# Patient Record
Sex: Female | Born: 1955 | Race: White | Hispanic: No | Marital: Married | Smoking: Never smoker
Health system: Southern US, Community
[De-identification: ages and names within clinical notes are randomized; demographics above are authoritative.]

## PROBLEM LIST (undated history)

## (undated) DIAGNOSIS — I10 Essential (primary) hypertension: Secondary | ICD-10-CM

## (undated) DIAGNOSIS — Z8619 Personal history of other infectious and parasitic diseases: Secondary | ICD-10-CM

## (undated) DIAGNOSIS — R011 Cardiac murmur, unspecified: Secondary | ICD-10-CM

## (undated) DIAGNOSIS — M545 Low back pain: Secondary | ICD-10-CM

## (undated) DIAGNOSIS — Z87448 Personal history of other diseases of urinary system: Secondary | ICD-10-CM

## (undated) HISTORY — DX: Personal history of other diseases of urinary system: Z87.448

## (undated) HISTORY — DX: Cardiac murmur, unspecified: R01.1

## (undated) HISTORY — DX: Low back pain: M54.5

## (undated) HISTORY — DX: Personal history of other infectious and parasitic diseases: Z86.19

---

## 1998-10-12 ENCOUNTER — Other Ambulatory Visit: Admission: RE | Admit: 1998-10-12 | Discharge: 1998-10-12 | Payer: Self-pay | Admitting: Obstetrics and Gynecology

## 1999-02-15 ENCOUNTER — Encounter: Admission: RE | Admit: 1999-02-15 | Discharge: 1999-02-15 | Payer: Self-pay | Admitting: Sports Medicine

## 1999-06-05 ENCOUNTER — Encounter: Admission: RE | Admit: 1999-06-05 | Discharge: 1999-06-05 | Payer: Self-pay | Admitting: Family Medicine

## 1999-10-29 ENCOUNTER — Other Ambulatory Visit: Admission: RE | Admit: 1999-10-29 | Discharge: 1999-10-29 | Payer: Self-pay | Admitting: Obstetrics and Gynecology

## 2004-10-18 ENCOUNTER — Ambulatory Visit: Payer: Self-pay | Admitting: Internal Medicine

## 2004-10-23 ENCOUNTER — Ambulatory Visit: Payer: Self-pay | Admitting: Family Medicine

## 2004-10-25 ENCOUNTER — Ambulatory Visit: Payer: Self-pay | Admitting: Family Medicine

## 2004-10-30 ENCOUNTER — Ambulatory Visit: Payer: Self-pay | Admitting: Family Medicine

## 2004-11-01 ENCOUNTER — Ambulatory Visit: Payer: Self-pay | Admitting: Sports Medicine

## 2005-12-16 ENCOUNTER — Ambulatory Visit: Payer: Self-pay | Admitting: Family Medicine

## 2005-12-18 ENCOUNTER — Ambulatory Visit: Payer: Self-pay | Admitting: Family Medicine

## 2009-02-09 LAB — CONVERTED CEMR LAB: Pap Smear: NORMAL

## 2010-03-01 ENCOUNTER — Emergency Department (HOSPITAL_COMMUNITY): Admission: EM | Admit: 2010-03-01 | Discharge: 2010-03-01 | Payer: Self-pay | Admitting: Family Medicine

## 2010-03-05 ENCOUNTER — Ambulatory Visit: Payer: Self-pay | Admitting: Internal Medicine

## 2010-03-05 ENCOUNTER — Telehealth: Payer: Self-pay | Admitting: Internal Medicine

## 2010-03-05 DIAGNOSIS — M545 Low back pain, unspecified: Secondary | ICD-10-CM

## 2010-03-05 HISTORY — DX: Low back pain, unspecified: M54.50

## 2010-03-14 ENCOUNTER — Telehealth: Payer: Self-pay | Admitting: *Deleted

## 2010-03-20 ENCOUNTER — Telehealth: Payer: Self-pay | Admitting: *Deleted

## 2010-05-08 ENCOUNTER — Ambulatory Visit: Payer: Self-pay | Admitting: Internal Medicine

## 2010-05-20 ENCOUNTER — Encounter: Payer: Self-pay | Admitting: Internal Medicine

## 2010-06-13 ENCOUNTER — Ambulatory Visit: Payer: Self-pay | Admitting: Internal Medicine

## 2010-06-13 NOTE — Assessment & Plan Note (Signed)
Summary: SEVERE LOWER BACK PAIN/ACUTE ISSUE ONLY/PT TO EST LATER/CJR   Vital Signs:  Patient profile:   55 year old female Menstrual status:  postmenopausal Height:      62.75 inches Weight:      127 pounds BMI:     22.76 Pulse rate:   66 / minute BP sitting:   110 / 80  (right arm) Cuff size:   regular  Vitals Entered By: Romualdo Bolk, CMA (AAMA) (March 05, 2010 10:50 AM) CC: New Pt- Lower back pain that started on 10/19. No injury. No burning upon urination or lower abd. pain.      Menstrual Status postmenopausal Last PAP Result normal   History of Present Illness: Vicki Stevens comes in today  for new patient  acute problem visit . However has been seen in the ED urgent care for this. on October 12  after running  her usual run . She C/O of being stiff in am and  run no change and then crescendo  pain as above  .     had run less the week before and then again.      raking  acorns in yard ois the only change in vigorous  activity .    Pain no radaition but slight cramp ant right  thigh.   Pain 7-8 /10    better with   standing  laying back.  Now is about    5/10 at its worse.     Preventive Care Screening  Pap Smear:    Date:  02/09/2009    Results:  normal   Last Tetanus Booster:    Date:  05/12/2006    Results:  Historical    Contraindications/Deferment of Procedures/Staging:    Test/Procedure: FLU VAX    Reason for deferment: patient declined     Test/Procedure: Mammogram    Reason for deferment: patient declined     Test/Procedure: Colonoscopy    Reason for deferment: patient declined   Preventive Screening-Counseling & Management  Alcohol-Tobacco     Alcohol drinks/day: <1     Alcohol type: wine     Smoking Status: never  Caffeine-Diet-Exercise     Caffeine use/day: 2     Does Patient Exercise: yes     Type of exercise: runs     Times/week:  5-6 miles   Safety-Violence-Falls     Seat Belt Use: yes     Firearms in the Home: firearms in  the home     Smoke Detectors: yes      Drug Use:  no.    Current Medications (verified): 1)  Flexeril 10 Mg Tabs (Cyclobenzaprine Hcl) .Marland Kitchen.. 1 Q 12 Hours 2)  Diclofenac Sodium 75 Mg Tbec (Diclofenac Sodium) .Marland Kitchen.. 1 By Mouth Q 12 Hours 3)  Vicodin 5-500 Mg Tabs (Hydrocodone-Acetaminophen) .Marland Kitchen.. 1 Q 8 Hours As Needed  Allergies (verified): No Known Drug Allergies  Past History:  Past Medical History: Unremarkable Hxof varicella  hx of blood in urine   Dr Vonita Moss   felt from  distance running. many years ago.  G3P1  hx of heart murmur   had chest pain.   has MVP  echo.  as a young adult.   Past Surgical History: Denies surgical history  Past History:  Care Management: Urgent Care- [facility] Gynecology: Dr Henderson Cloud   Family History: Father: Died of Cancer age 70- Lung Ca Mother: Died age 48- MVA Siblings: Healthy  Social History: Married hhof 2   daughter  in  college  Never Smoked Alcohol use-yes- Social Drug use-no Regular exercise-yes pet dog and cat.  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    Physical Exam  General:  Well-developed,well-nourished,in no acute distress; alert,appropriate and cooperative throughout examination mildly  uncomfortable with sitting   Head:  normocephalic and atraumatic.   Neck:  No deformities, masses, or tenderness noted. Lungs:  Normal respiratory effort, chest expands symmetrically. Lungs are clear to  auscultation, no crackles or wheezes. Heart:  Normal rate and regular rhythm. S1 and S2 normal without gallop, murmur, click, rub or other extra sounds. Msk:  back  tender in  SI area but no bony tenderness  pain more on flexion and extension   toe heel walk ok but some discomfort  Pulses:  pulses intact without delay   Extremities:  no clubbing cyanosis or edema  Neurologic:  alert & oriented X3, strength normal in all extremities, and DTRs symmetrical and normal.  mildy antalgic gait   Skin:  turgor normal, color normal, and no ecchymoses.   Cervical Nodes:  No lymphadenopathy noted Psych:  Oriented X3, good eye contact, not anxious appearing, and not depressed appearing.   ed note obtained and reviewed   Impression & Recommendations:  Problem # 1:  LOW BACK PAIN, ACUTE (ICD-724.2) with out alarm features  . and no discrete injury .  disc  activity  rec and aavoid run but cross train until  better .  Expectant management and rx   narcotic med only as needed  .  Discussed risk benefit  . Her updated medication list for this problem includes:    Flexeril 10 Mg Tabs (Cyclobenzaprine hcl) .Marland Kitchen... 1 q 12 hours    Diclofenac Sodium 75 Mg Tbec (Diclofenac sodium) .Marland Kitchen... 1 by mouth q 12 hours    Vicodin 5-500 Mg Tabs (Hydrocodone-acetaminophen) .Marland Kitchen... 1 q 8 hours as needed  Complete Medication List: 1)  Flexeril 10 Mg Tabs (Cyclobenzaprine hcl) .Marland Kitchen.. 1 q 12 hours 2)  Diclofenac Sodium 75 Mg Tbec (Diclofenac sodium) .Marland Kitchen.. 1 by mouth q 12 hours 3)  Vicodin 5-500 Mg Tabs (Hydrocodone-acetaminophen) .Marland Kitchen.. 1 q 8 hours as needed  Patient Instructions: 1)  agree this is mechanical  back pain. 2)  walking  is good. if not hurting . 3)  expect improvement in 2 week course and  resolution .  in a month  4)  otherwise   would get x ray and or further evlauation. 5)  Cross train in the meantime. Avoid bending and lifting.  for now.  6)  Sitting is difficult on the back .  Prescriptions: VICODIN 5-500 MG TABS  (HYDROCODONE-ACETAMINOPHEN) 1 q 8 hours as needed  #20 x 0   Entered and Authorized by:   Madelin Headings MD   Signed by:   Madelin Headings MD on 03/05/2010   Method used:   Print then Give to Patient   RxID:   [id number]    Orders Added: 1)  New Patient Level III [99203]

## 2010-06-13 NOTE — Progress Notes (Signed)
Summary: ED Visit from 03/01/10   Lorelee, Mclaurin MRN: [id number] Acct#: [id number] PHYSICIAN DOCUMENTATION SHEET Fri Oct 21 14:38:14 EDT 2011 Eligha Bridegroom. [facility] [address] [place], [place] [address] PHONE: [phone] MRN: [id number] Account #: [id number] Name: Salvador, Bigbee Sex: F Age: 55 DOB: 1955/08/23 Complaint: Back pain Primary Diagnosis: Low back pain Arrival Time: 03/01/2010 08:53 Discharge Time: 03/01/2010 11:12 All Providers: Dr. Arnoldo Hooker - FP PROVIDER: Dr. Arnoldo Hooker - FP HPI: The patient is a 55 year old female who presents with a chief complaint of back pain. The history was provided by the patient. here for low back pain x3 days.she reports she felt back was stiff 3 days ago. She still went running her usual and later that day pain in low back increased gradually. reports has been taking advil for pain 800mg  every 4hrs not helping. No pain when laying flat and sitting. reports pain with standing. No pain in buttock. occasionally feels pain in anterior mid thigh but no pains shooting down lower leg. No leg pain now. reports spasms in back. nof alls. no nocturnal pain. no saddle anesthesia. No tingling numbness weakness arms/legs. No bladder bowel incontinence. No neuro compl. .No urinary or vaginal compl. No hematuria. no flnak pain. no abdominal pain. no fever chills. no h/o low back pain in past. 10:53 03/01/2010 by Arnoldo Hooker - FP, Dr. Linus Orn: Constitutional: all Negative; Negative for fever, chills and dizziness. Eyes: all Negative ENMT: all Negative Cardiovascular: all Negative Respiratory: all Negative; Negative for cough, dyspnea and wheezing. Gastrointestinal: all Negative; Negative for nausea, vomiting, diarrhea and abdominal pain. Genitourinary: all Negative; Negative for dysuria, hematuria, flank pain and Frequency. Gynecologic: all Negative Musculoskeletal: otherwise Negative; Positive for back pain. Negative for  neck pain. NOTE - see HPI NOTE - see HPI Skin: all Negative; Negative for rash, abrasions and bruising. Neuro: all Negative; Negative for headache, confusion, Weakness, altered mental status, dizziness, lightheadedness, memory impairment, neck stiffness, numbness, seizure and vision loss. 10:53 03/01/2010 by Arnoldo Hooker - FP, Dr. Earl Lagos: 1 Marlita, Keil MRN: [id number] Acct#: [id number] Documentation: physician reviewed/amended Historian: patient Patient's Current Physicians Patient's Current Physicians (please list PCP first) Henderson Cloud - OB/GYN, Guy Sandifer Past medical history: none Social History: non-smoker, no drug abuse, occasional drinker Contraception: menopause Special Needs: none Allergies Drug Reaction Allergy Note NKDA 10:39 03/01/2010 by Arnoldo Hooker - FP, Dr. Home Medications: Documentation: physician reviewed/amended Medications Medication [Medication] Dosage Frequency Last Dose Advil Oral as needed 10:39 03/01/2010 by Arnoldo Hooker - FP, Dr. Physical examination: Vital signs and O2 SAT: reviewed, normal Constitutional: well developed, well nourished, in no acute distress Head and Face: normocephalic, atraumatic Neck: supple, full range of motion, no meningeal signs, no lymphadenopathy Spine: entire spine non-tender, lumbar spine non-tender, lumbar paraspinal tenderness NOTE - mild tender bilat paralumbar muscles . no spinal tednerness. Rom linmited at forward flexion secondary to pain. n Rom extension and lateral flexion full intact. no arsh abrasions deformity. spoke with pt about U/A and lumbar spine xrays. she declines both saying she is runner always had blood in urine and has seen urologist in past and wants to try something for muscle spasms adn strong pain meds first and if not better then will think about xrays. she declines xrays today. Cardiovascular: regular rate and rhythm, no murmur, rub, or gallop Respiratory: normal, no rales, rhonchi,  wheezes, or rub, breath sounds clear and equal bilaterally Chest: nontender, movement normal Abdomen: soft, nontender, normal bowel sounds, no guarding Genitourinary:  no CVA tenderness Extremities: normal, no abnormalities, no deformity, full range of motion, neurovascularly intact, pulses normal, no tenderness, no edema NOTE - SLR sitting and lying down negative. Gait toe and heel normal. Sensation normal. SI joint stable. ROM hip knee ankle normal. Babinskis bilat down going. Neuro: AA&Ox3, Cranial Nerves II-XII intact, motor intact in all extremities, sensation normal , normal reflexes, gait normal, normal coordination, normal speech Skin: color normal, no rash 2 Fiona, Coto MRN: [id number] Acct#: [id number] Psychiatric: AA&Ox3 10:53 03/01/2010 by Arnoldo Hooker - FP, Dr. Patient disposition: Patient disposition: Disch - Home Primary Diagnosis: low back pain Counseling: advised of diagnosis, advised of treatment plan, advised of need for close follow- up, advised of need to return for worsening or changing symptoms, advised of specific symptoms that should prompt their return 10:39 03/01/2010 by Arnoldo Hooker - FP, Dr. Prescriptions: Prescription Medication Dispense Sig Line Flexeril 5 mg Tab 10 1tab by mouth every 12hrs as needed for muscle spasm, causes drowsiness. VICOdin 5 mg-500 mg Tab 10 One tablet PO Q8hrs prn pain, causes drowsiness Voltaren 75 mg Tab 20 One tablet PO every 12hrs for pain with food . no ibuprofen or aleeve with it. 10:40 03/01/2010 by Arnoldo Hooker - FP, Dr. Medication disposition: Medications Medication [Medication] Dosage Frequency Last Dose Medication disposition PCP contact Advil Oral as needed stop 10:40 03/01/2010 by Arnoldo Hooker - FP, Dr. Discharge: Discharge Instructions: *free text Append a Note to Discharge Instructions: Voltaren 1tab every 12hrs for pain with food and take vicodin only as needed to take edge  off pain. ice back each time 3 times day. Follow up with Primary DR or here or orthopedic Dr Cecilie Kicks if back pain not better but if back pain worse ,any tingling numbness weakness arms/legs, bladder /bowel loss of control , fever chills getting sicker call 911 and go to Emergency room rightaway for further evaluation. 3 Riham, Polyakov MRN: [id number] Acct#: [id number] Referral/Appointment Refer Patient To: Phone Number: Follow-up in Appointment Details: Victorino Dike - MD, [facility] - Orthopedics [phone] - OB/GYN, Guy Sandifer [phone] pvt [phone] Drug Instructions: pain acetaminophen hydrocodone, pain muscle relaxant flexeril, pain nsaid 10:43 03/01/2010 by Arnoldo Hooker - FP, Dr. Milinda Pointer electronically signed by ER Physician 14:38 03/01/2010 by Arnoldo Hooker - FP, Dr. Dian Situ

## 2010-06-13 NOTE — Progress Notes (Signed)
Summary: REFILL REQUEST (Vicodin)  Phone Note Refill Request Message from:  Patient   [id number] on March 20, 2010 8:44 AM  Refills Requested: Medication #1:  VICODIN 5-500 MG TABS 1 q 8 hours as needed.   Notes: [facility] - [address]    Initial call taken by: Debbra Riding,  March 20, 2010 8:45 AM  Follow-up for Phone Call        ok to refill x 1  Follow-up by: Madelin Headings MD,  March 20, 2010 9:00 AM  Additional Follow-up for Phone Call Additional follow up Details #1::        Rx called in. Additional Follow-up by: Romualdo Bolk, CMA (AAMA),  March 20, 2010 9:19 AM    Prescriptions: VICODIN 5-500 MG TABS (HYDROCODONE-ACETAMINOPHEN) 1 q 8 hours as needed  #20 x 0   Entered by:   Romualdo Bolk, CMA (AAMA)   Authorized by:   Madelin Headings MD   Signed by:   Romualdo Bolk, CMA (AAMA) on 03/20/2010   Method used:   Telephoned to ...       [facility] [address] [phone]* (retail)       [address]       [place], [place]  [address]       Ph: [phone] or [phone]       Fax: [phone]   RxID:   [id number]

## 2010-06-13 NOTE — Letter (Signed)
Summary: Patient History Form  Patient History Form   Imported By: Maryln Gottron 03/14/2010 13:16:01  _____________________________________________________________________  External Attachment:    Type:   Image     Comment:   External Document

## 2010-06-13 NOTE — Progress Notes (Signed)
Summary: REFILL REQUEST  Phone Note Refill Request Message from:  Patient on March 14, 2010 9:57 AM  Refills Requested: Medication #1:  FLEXERIL 10 MG TABS 1 q 12 hours   Notes[facility].    Initial call taken by: Debbra Riding,  March 14, 2010 9:57 AM  Follow-up for Phone Call        ok to refill x 1  Follow-up by: Madelin Headings MD,  March 14, 2010 11:31 AM  Additional Follow-up for Phone Call Additional follow up Details #1::        Rx faxed to pharmacy. Additional Follow-up by: Romualdo Bolk, CMA (AAMA),  March 14, 2010 11:58 AM    Prescriptions: FLEXERIL 10 MG TABS (CYCLOBENZAPRINE HCL) 1 q 12 hours  #40 x 0   Entered by:   Romualdo Bolk, CMA (AAMA)   Authorized by:   Madelin Headings MD   Signed by:   Romualdo Bolk, CMA (AAMA) on 03/14/2010   Method used:   [facility]ronically to        [facility] [address] [phone]* (retail)       [address]       [place], [place]  [address]       Ph: [phone] or [phone]       Fax: [phone]   RxID:   [id number]

## 2010-06-19 ENCOUNTER — Ambulatory Visit: Payer: Self-pay | Admitting: Internal Medicine

## 2012-08-04 ENCOUNTER — Encounter: Payer: Self-pay | Admitting: Internal Medicine

## 2012-08-04 ENCOUNTER — Ambulatory Visit (INDEPENDENT_AMBULATORY_CARE_PROVIDER_SITE_OTHER): Payer: 59 | Admitting: Internal Medicine

## 2012-08-04 VITALS — BP 144/84 | HR 62 | Temp 98.6°F | Wt 134.0 lb

## 2012-08-04 DIAGNOSIS — L309 Dermatitis, unspecified: Secondary | ICD-10-CM | POA: Insufficient documentation

## 2012-08-04 DIAGNOSIS — L259 Unspecified contact dermatitis, unspecified cause: Secondary | ICD-10-CM

## 2012-08-04 DIAGNOSIS — R635 Abnormal weight gain: Secondary | ICD-10-CM | POA: Insufficient documentation

## 2012-08-04 DIAGNOSIS — R030 Elevated blood-pressure reading, without diagnosis of hypertension: Secondary | ICD-10-CM

## 2012-08-04 DIAGNOSIS — I10 Essential (primary) hypertension: Secondary | ICD-10-CM | POA: Insufficient documentation

## 2012-08-04 DIAGNOSIS — Z7189 Other specified counseling: Secondary | ICD-10-CM

## 2012-08-04 NOTE — Patient Instructions (Addendum)
Get bp check  Outside of the office.   Consider-  DASH 3500 calories is the energy content of a pound of body weight .Must have a 3500 cal deficit to lose one pound . Thus decrease 500 calorie equivalent per day in food or drink intake / or exercise  for 7 days to lose one pound.   Perianal dermatitis can be a  Yeast  infection with  Irritation  .     Use monistat or lotrimin miconazole or clotrimazole  Twice a day for at least 2 weeks    Can add 1-2 % hydrocortisone tiwce a day for itching and irritation.  Keep dry.  If no help at all in 2 weeks contact us.  If helping continue for another  2 weeks  Or so or as needed. Avoid wiopes and harsh chemicals also .   DASH Diet The DASH diet stands for "Dietary Approaches to Stop Hypertension." It is a healthy eating plan that has been shown to reduce high blood pressure (hypertension) in as little as 14 days, while also possibly providing other significant health benefits. These other health benefits include reducing the risk of breast cancer after menopause and reducing the risk of type 2 diabetes, heart disease, colon cancer, and stroke. Health benefits also include weight loss and slowing kidney failure in patients with chronic kidney disease.  DIET GUIDELINES  Limit salt (sodium). Your diet should contain less than 1500 mg of sodium daily.  Limit refined or processed carbohydrates. Your diet should include mostly whole grains. Desserts and added sugars should be used sparingly.  Include small amounts of heart-healthy fats. These types of fats include nuts, oils, and tub margarine. Limit saturated and trans fats. These fats have been shown to be harmful in the body. CHOOSING FOODS  The following food groups are based on a 2000 calorie diet. See your Registered Dietitian for individual calorie needs. Grains and Grain Products (6 to 8 servings daily)  Eat More Often: Whole-wheat bread, brown rice, whole-grain or wheat pasta, quinoa, popcorn  without added fat or salt (air popped).  Eat Less Often: White bread, white pasta, white rice, cornbread. Vegetables (4 to 5 servings daily)  Eat More Often: Fresh, frozen, and canned vegetables. Vegetables may be raw, steamed, roasted, or grilled with a minimal amount of fat.  Eat Less Often/Avoid: Creamed or fried vegetables. Vegetables in a cheese sauce. Fruit (4 to 5 servings daily)  Eat More Often: All fresh, canned (in natural juice), or frozen fruits. Dried fruits without added sugar. One hundred percent fruit juice ( cup [237 mL] daily).  Eat Less Often: Dried fruits with added sugar. Canned fruit in light or heavy syrup. [employer], Fish, and Poultry (2 servings or less daily. One serving is 3 to 4 oz [85-114 g]).  Eat More Often: Ninety percent or leaner ground beef, tenderloin, sirloin. Round cuts of beef, chicken breast, [place] breast. All fish. Grill, bake, or broil your meat. Nothing should be fried.  Eat Less Often/Avoid: Fatty cuts of meat, [place], or chicken leg, thigh, or wing. Fried cuts of meat or fish. Dairy (2 to 3 servings)  Eat More Often: Low-fat or fat-free milk, low-fat plain or light yogurt, reduced-fat or part-skim cheese.  Eat Less Often/Avoid: Milk (whole, 2%).Whole milk yogurt. Full-fat cheeses. Nuts, Seeds, and Legumes (4 to 5 servings per week)  Eat More Often: All without added salt.  Eat Less Often/Avoid: Salted nuts and seeds, canned beans with added salt. Fats and Sweets (  limited)  Eat More Often: Vegetable oils, tub margarines without trans fats, sugar-free gelatin. Mayonnaise and salad dressings.  Eat Less Often/Avoid: Coconut oils, palm oils, butter, stick margarine, cream, half and half, cookies, candy, pie. FOR MORE INFORMATION The Dash Diet Eating Plan: www.dashdiet.org Document Released: 04/17/2011 Document Revised: 07/21/2011 Document Reviewed: 04/17/2011 [facility] Patient Information 2013 [place], [place].

## 2012-08-04 NOTE — Progress Notes (Signed)
Chief Complaint  Patient presents with  . Anal Itching    Ongoing for 9 months.  There is blood when she wipes after using the restroom. talk about weight  bp up today?    HPI: Patient comes in today for SDA for  new problem evaluation. has been seen only once int his practice 10 /2011 for  Back pain.  EHR not updated at time of visit but reviewed  She is generally well but has had a prolonged problem as above Anal itching for 9 months waxes and wanes  And  Bad last night. No topicals put on 9 months ago  Higher fiber diet .    No family member . With pinworms or itching No treatment .caffiene  2 coffee in am.  No antibiotics.   Last gyne check 3 years.  She tries to stay healthy is a daily runner but will will shower before she goes to work doesn't really have prolonged exposure to sweaty close prolonged exposure  No history of psoriasis or other chronic skin condition reported today. Usually has a good blood pressure but is concerned about her reading today. Some family history try and keep better.  Wants to discuss losing weight she has gained some weight since menopausal time despite some exercise think she eats fairly well. Has questions  ROS: See pertinent positives and negatives per HPI. No chest pain shortness of breath unusual bleeding hearing vision problems GI upset change in bowel habits.   Past Medical History  Diagnosis Date  . LOW BACK PAIN, ACUTE 03/05/2010    Family History  Problem Relation Age of Onset  . Cancer Father     History   Social History  . Marital Status: Married    Spouse Name: N/A    Number of Children: N/A  . Years of Education: N/A   Social History Main Topics  . Smoking status: Never Smoker   . Smokeless tobacco: None  . Alcohol Use: Yes  . Drug Use: None  . Sexually Active: None   Other Topics Concern  . None   Social History Narrative  . None    Outpatient Encounter Prescriptions as of 08/04/2012  Medication Sig Dispense  Refill  . [DISCONTINUED] cyclobenzaprine (FLEXERIL) 10 MG tablet Take 10 mg by mouth 2 (two) times daily as needed.        . [DISCONTINUED] diclofenac (VOLTAREN) 75 MG EC tablet Take 75 mg by mouth 2 (two) times daily.        . [DISCONTINUED] HYDROcodone-acetaminophen (VICODIN) 5-500 MG per tablet Take 1 tablet by mouth every 8 (eight) hours as needed.         No facility-administered encounter medications on file as of 08/04/2012.   Please see  Prev ehr summary for further pmed soc hx  EXAM:  BP 144/84  Pulse 62  Temp(Src) 98.6 F (37 C) (Oral)  Wt 134 lb (60.782 kg)  BMI 23.92 kg/m2  SpO2 98%  Body mass index is 23.92 kg/(m^2).  GENERAL: vitals reviewed and listed above, alert, oriented, appears well hydrated and in no acute distress  HEENT: atraumatic, conjunctiva  clear, no obvious abnormalities on inspection of external nose and ears  NECK: no obvious masses on inspection palpation   abd soft wo mass   External rectal area small hemorrhoidal tag with minimal ulceration or abrasion. The rest shows about a 2 cm reading with discrete border of redness and lichenification around the anal area this does not go into the perineum  there is no scaling or psoriasis plaque there are no vesicles irregularity the area is smooth CV: HRRR, no clubbing cyanosis or  peripheral edema nl cap refill    MS: moves all extremities without noticeable focal  abnormality  PSYCH: pleasant and cooperative, no obvious depression or anxiety  ASSESSMENT AND PLAN:  Discussed the following assessment and plan:  Perianal dermatitis - Suspect monilial or fungal with chronic inflammation and lichenification no other alarm features  Weight gain - prob age ls non pathologic  is healthy by hx  Counseling on health promotion and disease prevention  Elevated blood pressure reading - dash diet recheck and fu if needed  -Patient advised to return or notify health care team  if symptoms worsen or persist or  new concerns arise.  Patient Instructions  Get bp check  Outside of the office.   Consider-  DASH 3500 calories is the energy content of a pound of body weight .Must have a 3500 cal deficit to lose one pound . Thus decrease 500 calorie equivalent per day in food or drink intake / or exercise  for 7 days to lose one pound.   Perianal dermatitis can be a  Yeast  infection with  Irritation  .     Use monistat or lotrimin miconazole or clotrimazole  Twice a day for at least 2 weeks    Can add 1-2 % hydrocortisone tiwce a day for itching and irritation.  Keep dry.  If no help at all in 2 weeks contact us.  If helping continue for another  2 weeks  Or so or as needed. Avoid wiopes and harsh chemicals also .   DASH Diet The DASH diet stands for "Dietary Approaches to Stop Hypertension." It is a healthy eating plan that has been shown to reduce high blood pressure (hypertension) in as little as 14 days, while also possibly providing other significant health benefits. These other health benefits include reducing the risk of breast cancer after menopause and reducing the risk of type 2 diabetes, heart disease, colon cancer, and stroke. Health benefits also include weight loss and slowing kidney failure in patients with chronic kidney disease.  DIET GUIDELINES  Limit salt (sodium). Your diet should contain less than 1500 mg of sodium daily.  Limit refined or processed carbohydrates. Your diet should include mostly whole grains. Desserts and added sugars should be used sparingly.  Include small amounts of heart-healthy fats. These types of fats include nuts, oils, and tub margarine. Limit saturated and trans fats. These fats have been shown to be harmful in the body. CHOOSING FOODS  The following food groups are based on a 2000 calorie diet. See your Registered Dietitian for individual calorie needs. Grains and Grain Products (6 to 8 servings daily)  Eat More Often: Whole-wheat bread, brown rice,  whole-grain or wheat pasta, quinoa, popcorn without added fat or salt (air popped).  Eat Less Often: White bread, white pasta, white rice, cornbread. Vegetables (4 to 5 servings daily)  Eat More Often: Fresh, frozen, and canned vegetables. Vegetables may be raw, steamed, roasted, or grilled with a minimal amount of fat.  Eat Less Often/Avoid: Creamed or fried vegetables. Vegetables in a cheese sauce. Fruit (4 to 5 servings daily)  Eat More Often: All fresh, canned (in natural juice), or frozen fruits. Dried fruits without added sugar. One hundred percent fruit juice ( cup [237 mL] daily).  Eat Less Often: Dried fruits with added sugar. Canned fruit in light or heavy syrup. [employer]  Meats, Fish, and Poultry (2 servings or less daily. One serving is 3 to 4 oz [85-114 g]).  Eat More Often: Ninety percent or leaner ground beef, tenderloin, sirloin. Round cuts of beef, chicken breast, [place] breast. All fish. Grill, bake, or broil your meat. Nothing should be fried.  Eat Less Often/Avoid: Fatty cuts of meat, [place], or chicken leg, thigh, or wing. Fried cuts of meat or fish. Dairy (2 to 3 servings)  Eat More Often: Low-fat or fat-free milk, low-fat plain or light yogurt, reduced-fat or part-skim cheese.  Eat Less Often/Avoid: Milk (whole, 2%).Whole milk yogurt. Full-fat cheeses. Nuts, Seeds, and Legumes (4 to 5 servings per week)  Eat More Often: All without added salt.  Eat Less Often/Avoid: Salted nuts and seeds, canned beans with added salt. Fats and Sweets (limited)  Eat More Often: Vegetable oils, tub margarines without trans fats, sugar-free gelatin. Mayonnaise and salad dressings.  Eat Less Often/Avoid: Coconut oils, palm oils, butter, stick margarine, cream, half and half, cookies, candy, pie. FOR MORE INFORMATION The Dash Diet Eating Plan: www.dashdiet.org Document Released: 04/17/2011 Document Revised: 07/21/2011 Document Reviewed: 04/17/2011 [facility] Patient Information 2013  [place], [place].        Total visit > 50% spent counseling and coordinating care   Trimble K. Panosh M.D.

## 2013-07-13 ENCOUNTER — Other Ambulatory Visit (INDEPENDENT_AMBULATORY_CARE_PROVIDER_SITE_OTHER): Payer: BC Managed Care – PPO

## 2013-07-13 DIAGNOSIS — Z0000 Encounter for general adult medical examination without abnormal findings: Secondary | ICD-10-CM

## 2013-07-13 LAB — CBC WITH DIFFERENTIAL/PLATELET
Basophils Absolute: 0 10*3/uL (ref 0.0–0.1)
Basophils Relative: 0.6 % (ref 0.0–3.0)
Eosinophils Absolute: 0.3 10*3/uL (ref 0.0–0.7)
Eosinophils Relative: 6 % — ABNORMAL HIGH (ref 0.0–5.0)
HCT: 43 % (ref 36.0–46.0)
Hemoglobin: 14.2 g/dL (ref 12.0–15.0)
Lymphocytes Relative: 28.3 % (ref 12.0–46.0)
Lymphs Abs: 1.6 10*3/uL (ref 0.7–4.0)
MCHC: 33 g/dL (ref 30.0–36.0)
MCV: 93.2 fl (ref 78.0–100.0)
Monocytes Absolute: 0.4 10*3/uL (ref 0.1–1.0)
Monocytes Relative: 7.6 % (ref 3.0–12.0)
Neutro Abs: 3.2 10*3/uL (ref 1.4–7.7)
Neutrophils Relative %: 57.5 % (ref 43.0–77.0)
Platelets: 322 10*3/uL (ref 150.0–400.0)
RBC: 4.61 Mil/uL (ref 3.87–5.11)
RDW: 13 % (ref 11.5–14.6)
WBC: 5.6 10*3/uL (ref 4.5–10.5)

## 2013-07-13 LAB — BASIC METABOLIC PANEL
BUN: 9 mg/dL (ref 6–23)
CO2: 31 mEq/L (ref 19–32)
Calcium: 9.4 mg/dL (ref 8.4–10.5)
Chloride: 105 mEq/L (ref 96–112)
Creatinine, Ser: 0.6 mg/dL (ref 0.4–1.2)
GFR: 113.92 mL/min (ref >60.00)
Glucose, Bld: 103 mg/dL — ABNORMAL HIGH (ref 70–99)
Potassium: 5.2 mEq/L — ABNORMAL HIGH (ref 3.5–5.1)
Sodium: 142 mEq/L (ref 135–145)

## 2013-07-13 LAB — LIPID PANEL
Cholesterol: 194 mg/dL (ref 0–200)
HDL: 58.5 mg/dL (ref >39.00)
LDL Cholesterol: 113 mg/dL — ABNORMAL HIGH (ref 0–99)
Total CHOL/HDL Ratio: 3
Triglycerides: 113 mg/dL (ref 0.0–149.0)
VLDL: 22.6 mg/dL (ref 0.0–40.0)

## 2013-07-13 LAB — HEPATIC FUNCTION PANEL
ALT: 34 U/L (ref 0–35)
AST: 21 U/L (ref 0–37)
Albumin: 3.9 g/dL (ref 3.5–5.2)
Alkaline Phosphatase: 61 U/L (ref 39–117)
Bilirubin, Direct: 0.1 mg/dL (ref 0.0–0.3)
Total Bilirubin: 0.9 mg/dL (ref 0.3–1.2)
Total Protein: 6.8 g/dL (ref 6.0–8.3)

## 2013-07-13 LAB — TSH: TSH: 1.83 u[IU]/mL (ref 0.35–5.50)

## 2013-07-20 ENCOUNTER — Ambulatory Visit (INDEPENDENT_AMBULATORY_CARE_PROVIDER_SITE_OTHER): Payer: BC Managed Care – PPO | Admitting: Internal Medicine

## 2013-07-20 ENCOUNTER — Encounter: Payer: Self-pay | Admitting: Internal Medicine

## 2013-07-20 VITALS — BP 128/86 | Temp 98.2°F | Ht 62.5 in | Wt 128.0 lb

## 2013-07-20 DIAGNOSIS — G4700 Insomnia, unspecified: Secondary | ICD-10-CM

## 2013-07-20 DIAGNOSIS — R030 Elevated blood-pressure reading, without diagnosis of hypertension: Secondary | ICD-10-CM

## 2013-07-20 DIAGNOSIS — Z0000 Encounter for general adult medical examination without abnormal findings: Secondary | ICD-10-CM

## 2013-07-20 NOTE — Progress Notes (Signed)
Pre visit review using our clinic review tool, if applicable. No additional management support is needed unless otherwise documented below in the visit note. 

## 2013-07-20 NOTE — Progress Notes (Signed)
Chief Complaint  Patient presents with  . Annual Exam    HPI: Patient comes in today for Preventive Health Care visit  No major health issues bp up today  No hx of blood pressure    Runs regularly  And won trophy/. No cv pulmo restrictions  Poor sleep  No rls no  For years no osa  lgiht sleeper andbetter when away and dark area  hasnt gotten colonoscopy hesistant neg fam hx .   Health Maintenance  Topic Date Due  . Mammogram  08/10/2006  . Colonoscopy  08/10/2006  . Pap Smear  02/10/2012  . Influenza Vaccine  05/12/2014 (Originally 12/10/2012)  . Tetanus/tdap  05/12/2016   Health Maintenance Review   ROS:  GEN/ HEENT: No fever, significant weight changes sweats headaches vision problems hearing changes, CV/ PULM; No chest pain shortness of breath cough, syncope,edema  change in exercise tolerance. GI /GU: No adominal pain, vomiting, change in bowel habits. No blood in the stool. No significant GU symptoms. SKIN/HEME: ,no acute skin rashes suspicious lesions or bleeding.check area left trunk cant see ? No change  No lymphadenopathy, nodules, masses.  NEURO/ PSYCH:  No neurologic signs such as weakness numbness. No depression anxiety.never sleep s well light sleeper but no rls or osa   Has ocass twinge right si area not sever nad no radiatio IMM/ Allergy: No unusual infections.  Allergy .   REST of 12 system review negative except as per HPI   Past Medical History  Diagnosis Date  . LOW BACK PAIN, ACUTE 03/05/2010  . Hx of varicella   . Heart murmur     evaluation ? mvp when younger  . H/O hematuria remote    dr Vonita Moss felt to be from running     Family History  Problem Relation Age of Onset  . Lung cancer Father     83  . Other Mother     MVA 53     History   Social History  . Marital Status: Married    Spouse Name: N/A    Number of Children: N/A  . Years of Education: N/A   Social History Main Topics  . Smoking status: Never Smoker   . Smokeless  tobacco: None  . Alcohol Use: Yes  . Drug Use: None  . Sexual Activity: None   Other Topics Concern  . 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 Multiple Vitamins-Minerals (ADULT GUMMY PO) Take by mouth.    EXAM:  BP 128/86  Temp(Src) 98.2 F (36.8 C) (Oral)  Ht 5' 2.5" (1.588 m)  Wt 128 lb (58.06 kg)  BMI 23.02 kg/m2  Body mass index is 23.02 kg/(m^2).  Physical Exam: Vital signs reviewed ZOX:WRUE is a well-developed well-nourished alert cooperative    who appearsr stated age in no acute distress. bp better on repeat  HEENT: normocephalic atraumatic , Eyes: PERRL EOM's full, conjunctiva clear, Nares: paten,t no deformity discharge or tenderness., Ears: no deformity EAC's clear TMs with normal landmarks. Mouth: clear OP, no lesions, edema.  Moist mucous membranes. Dentition in adequate repair. NECK: supple without masses, thyromegaly or bruits. CHEST/PULM:  Clear to auscultation and percussion breath sounds equal no wheeze , rales or rhonchi.  No chest wall deformities or tenderness. Breast: normal by inspection . No dimpling, discharge, masses, tenderness or discharge . CV: PMI is nondisplaced, S1 S2 no gallops, murmurs, rubs. Peripheral pulses are full without delay.No JVD .  ABDOMEN: Bowel sounds normal nontender  No guard or rebound, no hepato splenomegal no CVA tenderness.  No hernia. Extremtities:  No clubbing cyanosis or edema, no acute joint swelling or redness no focal atrophy NEURO:  Oriented x3, cranial nerves 3-12 appear to be intact, no obvious focal weakness,gait within normal limits no abnormal reflexes or asymmetrical SKIN: No acute rashes normal turgor, color, no bruising or petechiae.left trunk with 3-4 mm flosh colored ? Mole vs sk symmetrical scaly benign appearing  except for size  PSYCH: Oriented, good eye contact, no obvious depression anxiety, cognition and judgment appear normal. LN: no cervical axillary inguinal adenopathy  Lab Results  Component Value Date   WBC 5.6 07/13/2013   HGB 14.2 07/13/2013   HCT 43.0 07/13/2013   PLT 322.0 07/13/2013   GLUCOSE 103* 07/13/2013   CHOL 194 07/13/2013   TRIG 113.0 07/13/2013   HDL 58.50 07/13/2013   LDLCALC 113* 07/13/2013   ALT 34 07/13/2013   AST 21 07/13/2013   NA 142 07/13/2013   K 5.2* 07/13/2013   CL 105 07/13/2013   CREATININE 0.6 07/13/2013   BUN 9 07/13/2013   CO2 31 07/13/2013   TSH 1.83 07/13/2013  above potass prob from blood draw.  ASSESSMENT AND PLAN:  Discussed the following assessment and plan:  Visit for preventive health examination - advise colonscopy "will think about it"make appt with dr Juanda Chance, get mammo also   Elevated blood pressure reading - better on repeat  monitor at home  send in readings will rx if  elevated   Insomnia - light sleeper always reviewed strategies short of med  but ocul try melatonin per sleep also  Monitor skin lesion and recheck order check or  remove if changing  Patient Care Team: Madelin Headings, MD as PCP - General Leslie Andrea, MD (Obstetrics and Gynecology) Patient Instructions     Continue lifestyle intervention healthy eating and exercise . Check bp readings at least 3 x per week  Or daily for 2 -4 weeks  Send in readings . Will make plan at that time . Take pix of the skin area seems benign  but  If growing or changing get removed . Sleep optimization  Try melatonin 2-4 hours pre sleep time. Avoid  Back lighting  Sleep aids can cause of a number or side effects . Let a colonscopy  And mammogram.   Wt Readings from Last 3 Encounters:  07/20/13 128 lb (58.06 kg)  08/04/12 134 lb (60.782 kg)  03/05/10 127 lb (57.607 kg)   How to Take Your Blood Pressure  These instructions are only for electronic home blood pressure machines. You will need:   An  automatic or semi-automatic blood pressure machine.  Fresh batteries for the blood pressure machine. HOW DO I USE THESE TOOLS TO CHECK MY BLOOD PRESSURE?   There are 2 numbers that make up your blood pressure. For example: 120/80.  The first number (120 in our example) is called the "systolic pressure." It is a measure of the pressure in your blood vessels when your heart is pumping blood.  The second number (80 in our example) is called the "diastolic pressure." It is a measure of the pressure in your blood vessels when your heart is resting  between beats.  Before you buy a home blood pressure machine, check the size of your arm so you can buy the right size cuff. Here is how to check the size of your arm:  Use a tape measure that shows both inches and centimeters.  Wrap the tape measure around the middle upper part of your arm. You may need someone to help you measure right.  Write down your arm measurement in both inches and centimeters.  To measure your blood pressure right, it is important to have the right size cuff.  If your arm is up to 13 inches (37 to 34 centimeters), get an adult cuff size.  If your arm is 13 to 17 inches (35 to 44 centimeters), get a large adult cuff size.  If your arm is 17 to 20 inches (45 to 52 centimeters), get an adult thigh cuff.  Try to rest or relax for at least 30 minutes before you check your blood pressure.  Do not smoke.  Do not have any drinks with caffeine, such as:  Pop.  Coffee.  Tea.  Check your blood pressure in a quiet room.  Sit down and stretch out your arm on a table. Keep your arm at about the level of your heart. Let your arm relax. GETTING BLOOD PRESSURE READINGS  Make sure you remove any tight-fighting clothing from your arm. Wrap the cuff around your upper arm. Wrap it just above the bend, and above where you felt the pulse. You should be able to slip a finger between the cuff and your arm. If you cannot slip a finger  in the cuff, it is too tight and should be removed and rewrapped.  Some units requires you to manually pump up the arm cuff.  Automatic units inflate the cuff when you press a button.  Cuff deflation is automatic in both models.  After the cuff is inflated, the unit measures your blood pressure and pulse. The readings are displayed on a monitor. Hold still and breathe normally while the cuff is inflated.  Getting a reading takes less than a minute.  Some models store readings in a memory. Some provide a printout of readings.  Get readings at different times of the day. You should wait at least 5 minutes between readings. Take readings with you to your next doctor's visit. Document Released: 04/10/2008 Document Revised: 07/21/2011 Document Reviewed: 04/10/2008 [facility] LLCExitCare Patient Information 2014 [place], [place].  128/86 right and 138/88 left     Neta MendsWanda K. Holliday Sheaffer M.D.

## 2013-07-20 NOTE — Patient Instructions (Addendum)
Continue lifestyle intervention healthy eating and exercise . Check bp readings at least 3 x per week  Or daily for 2 -4 weeks  Send in readings . Will make plan at that time . Take pix of the skin area seems benign  but  If growing or changing get removed . Sleep optimization  Try melatonin 2-4 hours pre sleep time. Avoid  Back lighting  Sleep aids can cause of a number or side effects . Let a colonscopy  And mammogram.   Wt Readings from Last 3 Encounters:  07/20/13 128 lb (58.06 kg)  08/04/12 134 lb (60.782 kg)  03/05/10 127 lb (57.607 kg)   How to Take Your Blood Pressure  These instructions are only for electronic home blood pressure machines. You will need:   An automatic or semi-automatic blood pressure machine.  Fresh batteries for the blood pressure machine. HOW DO I USE THESE TOOLS TO CHECK MY BLOOD PRESSURE?   There are 2 numbers that make up your blood pressure. For example: 120/80.  The first number (120 in our example) is called the "systolic pressure." It is a measure of the pressure in your blood vessels when your heart is pumping blood.  The second number (80 in our example) is called the "diastolic pressure." It is a measure of the pressure in your blood vessels when your heart is resting between beats.  Before you buy a home blood pressure machine, check the size of your arm so you can buy the right size cuff. Here is how to check the size of your arm:  Use a tape measure that shows both inches and centimeters.  Wrap the tape measure around the middle upper part of your arm. You may need someone to help you measure right.  Write down your arm measurement in both inches and centimeters.  To measure your blood pressure right, it is important to have the right size cuff.  If your arm is up to 13 inches (37 to 34 centimeters), get an adult cuff size.  If your arm is 13 to 17 inches (35 to 44 centimeters), get a large adult cuff size.  If your arm is 17 to  20 inches (45 to 52 centimeters), get an adult thigh cuff.  Try to rest or relax for at least 30 minutes before you check your blood pressure.  Do not smoke.  Do not have any drinks with caffeine, such as:  Pop.  Coffee.  Tea.  Check your blood pressure in a quiet room.  Sit down and stretch out your arm on a table. Keep your arm at about the level of your heart. Let your arm relax. GETTING BLOOD PRESSURE READINGS  Make sure you remove any tight-fighting clothing from your arm. Wrap the cuff around your upper arm. Wrap it just above the bend, and above where you felt the pulse. You should be able to slip a finger between the cuff and your arm. If you cannot slip a finger in the cuff, it is too tight and should be removed and rewrapped.  Some units requires you to manually pump up the arm cuff.  Automatic units inflate the cuff when you press a button.  Cuff deflation is automatic in both models.  After the cuff is inflated, the unit measures your blood pressure and pulse. The readings are displayed on a monitor. Hold still and breathe normally while the cuff is inflated.  Getting a reading takes less than a minute.  Some  models store readings in a memory. Some provide a printout of readings.  Get readings at different times of the day. You should wait at least 5 minutes between readings. Take readings with you to your next doctor's visit. Document Released: 04/10/2008 Document Revised: 07/21/2011 Document Reviewed: 04/10/2008 [facility] Patient Information 2014 [place]ExitCare, [place].  128/86 right and 138/88 left

## 2013-07-22 DIAGNOSIS — G4700 Insomnia, unspecified: Secondary | ICD-10-CM | POA: Insufficient documentation

## 2013-07-22 DIAGNOSIS — Z0000 Encounter for general adult medical examination without abnormal findings: Secondary | ICD-10-CM | POA: Insufficient documentation

## 2014-02-24 ENCOUNTER — Other Ambulatory Visit: Payer: Self-pay

## 2014-06-12 ENCOUNTER — Encounter: Payer: Self-pay | Admitting: Internal Medicine

## 2014-06-12 ENCOUNTER — Ambulatory Visit (INDEPENDENT_AMBULATORY_CARE_PROVIDER_SITE_OTHER): Payer: No Typology Code available for payment source | Admitting: Internal Medicine

## 2014-06-12 VITALS — BP 150/96 | Temp 98.4°F | Ht 62.5 in | Wt 119.3 lb

## 2014-06-12 DIAGNOSIS — I341 Nonrheumatic mitral (valve) prolapse: Secondary | ICD-10-CM | POA: Diagnosis not present

## 2014-06-12 DIAGNOSIS — Z0000 Encounter for general adult medical examination without abnormal findings: Secondary | ICD-10-CM | POA: Diagnosis not present

## 2014-06-12 DIAGNOSIS — R030 Elevated blood-pressure reading, without diagnosis of hypertension: Secondary | ICD-10-CM

## 2014-06-12 DIAGNOSIS — R0789 Other chest pain: Secondary | ICD-10-CM | POA: Diagnosis not present

## 2014-06-12 DIAGNOSIS — Z2821 Immunization not carried out because of patient refusal: Secondary | ICD-10-CM | POA: Diagnosis not present

## 2014-06-12 LAB — BASIC METABOLIC PANEL
BUN: 11 mg/dL (ref 6–23)
CHLORIDE: 106 meq/L (ref 96–112)
CO2: 28 mEq/L (ref 19–32)
Calcium: 9.4 mg/dL (ref 8.4–10.5)
Creatinine, Ser: 0.58 mg/dL (ref 0.40–1.20)
GFR: 113.55 mL/min (ref >60.00)
GLUCOSE: 95 mg/dL (ref 70–99)
Potassium: 3.8 mEq/L (ref 3.5–5.1)
Sodium: 140 mEq/L (ref 135–145)

## 2014-06-12 LAB — TSH: TSH: 1.01 u[IU]/mL (ref 0.35–4.50)

## 2014-06-12 LAB — HEPATIC FUNCTION PANEL
ALK PHOS: 64 U/L (ref 39–117)
ALT: 23 U/L (ref 0–35)
AST: 23 U/L (ref 0–37)
Albumin: 4.4 g/dL (ref 3.5–5.2)
BILIRUBIN TOTAL: 0.4 mg/dL (ref 0.2–1.2)
Bilirubin, Direct: 0.1 mg/dL (ref 0.0–0.3)
Total Protein: 6.7 g/dL (ref 6.0–8.3)

## 2014-06-12 LAB — CBC WITH DIFFERENTIAL/PLATELET
BASOS ABS: 0 10*3/uL (ref 0.0–0.1)
BASOS PCT: 0.3 % (ref 0.0–3.0)
EOS PCT: 2.8 % (ref 0.0–5.0)
Eosinophils Absolute: 0.2 10*3/uL (ref 0.0–0.7)
HCT: 42.2 % (ref 36.0–46.0)
HEMOGLOBIN: 14.5 g/dL (ref 12.0–15.0)
LYMPHS PCT: 20.5 % (ref 12.0–46.0)
Lymphs Abs: 1.7 10*3/uL (ref 0.7–4.0)
MCHC: 34.4 g/dL (ref 30.0–36.0)
MCV: 88.3 fl (ref 78.0–100.0)
Monocytes Absolute: 0.6 10*3/uL (ref 0.1–1.0)
Monocytes Relative: 7.5 % (ref 3.0–12.0)
NEUTROS ABS: 5.6 10*3/uL (ref 1.4–7.7)
Neutrophils Relative %: 68.9 % (ref 43.0–77.0)
Platelets: 282 10*3/uL (ref 150.0–400.0)
RBC: 4.78 Mil/uL (ref 3.87–5.11)
RDW: 12.6 % (ref 11.5–15.5)
WBC: 8.1 10*3/uL (ref 4.0–10.5)

## 2014-06-12 LAB — LIPID PANEL
CHOL/HDL RATIO: 3
CHOLESTEROL: 210 mg/dL — AB (ref 0–200)
HDL: 63.6 mg/dL (ref >39.00)
LDL Cholesterol: 122 mg/dL — ABNORMAL HIGH (ref 0–99)
NonHDL: 146.4
Triglycerides: 122 mg/dL (ref 0.0–149.0)
VLDL: 24.4 mg/dL (ref 0.0–40.0)

## 2014-06-12 LAB — T4, FREE: Free T4: 0.8 ng/dL (ref 0.60–1.60)

## 2014-06-12 NOTE — Patient Instructions (Addendum)
Exam is good and reassuring that  Your exercise tolerance is good . ekg  Will notify you  of labs when available. May need to  Add medication for bp if confirmed up. Take blood pressure readings twice a day for 10 - 14 days and then periodically .To ensure below 140/90   .Send in readings     considier if esophageal  Discomfort .  Will arrange an echo test of heart and then follow up.   Echocardiogram An echocardiogram, or echocardiography, uses sound waves (ultrasound) to produce an image of your heart. The echocardiogram is simple, painless, obtained within a short period of time, and offers valuable information to your health care provider. The images from an echocardiogram can provide information such as:  Evidence of coronary artery disease (CAD).  Heart size.  Heart muscle function.  Heart valve function.  Aneurysm detection.  Evidence of a past heart attack.  Fluid buildup around the heart.  Heart muscle thickening.  Assess heart valve function. LET [facility] St[facility] LouisYOUR HEALTH CARE PROVIDER KNOW ABOUT:  Any allergies you have.  All medicines you are taking, including vitamins, herbs, eye drops, creams, and over-the-counter medicines.  Previous problems you or members of your family have had with the use of anesthetics.  Any blood disorders you have.  Previous surgeries you have had.  Medical conditions you have.  Possibility of pregnancy, if this applies. BEFORE THE PROCEDURE  No special preparation is needed. Eat and drink normally.  PROCEDURE   In order to produce an image of your heart, gel will be applied to your chest and a wand-like tool (transducer) will be moved over your chest. The gel will help transmit the sound waves from the transducer. The sound waves will harmlessly bounce off your heart to allow the heart images to be captured in real-time motion. These images will then be recorded.  You may need an IV to receive a medicine that improves the quality of the  pictures. AFTER THE PROCEDURE You may return to your normal schedule including diet, activities, and medicines, unless your health care provider tells you otherwise. Document Released: 04/25/2000 Document Revised: 09/12/2013 Document Reviewed: 01/03/2013 [facility]Exit[facility] Patient Information 2015 [place]ExitCare, [place]LL[place]. This information is not intended to replace advice given to you by your health care provider. Make sure you discuss any questions you have with your health care provider.

## 2014-06-12 NOTE — Progress Notes (Signed)
Pre visit review using our clinic review tool, if applicable. No additional management support is needed unless otherwise documented below in the visit note.  Chief Complaint  Patient presents with  . Elevated BP Readings    night time chest pressure x 2    HPI: Patient Vicki Stevens  comes in today for SDA for  new problem evaluation. Last visit 3 15   ;bp was high last year  And did low salt diet and came down to 133/85  August was rxed for peridontal disease and  And on antibiotics   "Helped the bp down " . Within 2 days. And  BPhas been fine  September.  but over the last 2 days having chest pressure waking up .   About 3 am without assoc sx such as cough choking gerd sx  Last 30 minute gets up and watches tv/  bp was high this am   153/94  When too kit but hadnt taken readings in a few months 10 pounds lighter from  healthy manner .  Running 4 miles 5 x per week and walking  No cp sob  .  Sleep much better  Until recent. Last 2 nights  No late  night eating .  Not positional .  Has mvp mild  And some chest pains  Not for 30 years .   Feels beating  pund once in a while at night  When quiet. .  No racing heart syncope or neuro sx . ROS: See pertinent positives and negatives per HPI.as per hpi no sig etoh caffiene etc  Hx of MVP dx year ago  Past Medical History  Diagnosis Date  . LOW BACK PAIN, ACUTE 03/05/2010  . Hx of varicella   . Heart murmur     evaluation ? mvp when younger  . H/O hematuria remote    dr Terance Hart felt to be from running     Family History  Problem Relation Age of Onset  . Lung cancer Father     59  . Other Mother     MVA 59     History   Social History  . Marital Status: Married    Spouse Name: N/A    Number of Children: N/A  . Years of Education: N/A   Social History Main Topics  . Smoking status: Never Smoker   . Smokeless tobacco: Not on file  . Alcohol Use: Yes  . Drug Use: Not on file  . Sexual Activity: Not on file   Other Topics  Concern  . 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 Multiple Vitamins-Minerals (ADULT GUMMY PO) Take by mouth.    EXAM:  BP 150/96 mmHg  Temp(Src) 98.4 F (36.9 C) (Oral)  Ht 5' 2.5" (1.588 m)  Wt 119 lb 4.8 oz (54.114 kg)  BMI 21.46 kg/m2  Body mass index is 21.46 kg/(m^2).  GENERAL: vitals reviewed and listed above, alert, oriented, appears well hydrated and in no acute distress HEENT: atraumatic, conjunctiva  clear, no obvious abnormalities on inspection of external nose and ears OP : no lesion edema or exudate  NECK: no obvious masses on inspection palpation  LUNGS: clear to auscultation bilaterally, no wheezes,  rales or rhonchi, good air movement CV: HRRR, no clubbing cyanosis or  peripheral edema nl cap refill  Abdomen:  Sof,t normal bowel sounds without hepatosplenomegaly, no guarding rebound or masses no CVA tenderness MS: moves all extremities without noticeable focal  abnormality PSYCH: pleasant and cooperative, no obvious depression or anxiety BP Readings from Last 3 Encounters:  06/12/14 150/96  07/20/13 128/86  08/04/12 144/84   Lab Results  Component Value Date   WBC 8.1 06/12/2014   HGB 14.5 06/12/2014   HCT 42.2 06/12/2014   PLT 282.0 06/12/2014   GLUCOSE 95 06/12/2014   CHOL 210* 06/12/2014   TRIG 122.0 06/12/2014   HDL 63.60 06/12/2014   LDLCALC 122* 06/12/2014   ALT 23 06/12/2014   AST 23 06/12/2014   NA 140 06/12/2014   K 3.8 06/12/2014   CL 106 06/12/2014   CREATININE 0.58 06/12/2014   BUN 11 06/12/2014   CO2 28 06/12/2014   TSH 1.01 06/12/2014    ASSESSMENT AND PLAN:  Discussed the following assessment and plan:  Chest tightness or pressure - nocturnal x 2 atypical for cardiac but elevaetd bp and hx mvp  - Plan: Basic metabolic panel,  TSH, T4, free, CBC with Differential/Platelet, EKG 12-Lead, Hepatic function panel, Lipid panel  Elevated blood pressure reading - see text . controlled by lsi no sx of osaget more readings and rx as seen - Plan: Basic metabolic panel, TSH, T4, free, CBC with Differential/Platelet, EKG 12-Lead, Hepatic function panel, Lipid panel  MVP (mitral valve prolapse) - dont hear m today get echo  - Plan: Basic metabolic panel, TSH, T4, free, CBC with Differential/Platelet, EKG 12-Lead, Hepatic function panel, Lipid panel Get ekg normal  Sounds like ocass premature beats at night  No alarm features except this is a ne phenom  Get echo to check lv fucntion  Signs of end organ ht ...strain valves etc.  -Patient advised to return or notify health care team  if symptoms worsen ,persist or new concerns arise. In the interim.   Lab s today  Keep cpx appt in [facility]  Patient Instructions  Exam is good and reassuring that  Your exercise tolerance is good . ekg  Will notify you  of labs when available. May need to  Add medication for bp if confirmed up. Take blood pressure readings twice a day for 10 - 14 days and then periodically .To ensure below 140/90   .Send in readings     considier if esophageal  Discomfort .  Will arrange an echo test of heart and then follow up.   Echocardiogram An echocardiogram, or echocardiography, uses sound waves (ultrasound) to produce an image of your heart. The echocardiogram is simple, painless, obtained within a short period of time, and offers valuable information to your health care provider. The images from an echocardiogram can provide information such as:  Evidence of coronary artery disease (CAD).  Heart size.  Heart muscle function.  Heart valve function.  Aneurysm detection.  Evidence of a past heart attack.  Fluid buildup around the heart.  Heart muscle thickening.  Assess heart valve function. LET [facility] CARE PROVIDER KNOW ABOUT:  Any allergies  you have.  All medicines you are taking, including vitamins, herbs, eye drops, creams, and over-the-counter medicines.  Previous problems you or members of your family have had with the use of anesthetics.  Any blood disorders you have.  Previous surgeries you have had.  Medical conditions you have.  Possibility of pregnancy, if  this applies. BEFORE THE PROCEDURE  No special preparation is needed. Eat and drink normally.  PROCEDURE   In order to produce an image of your heart, gel will be applied to your chest and a wand-like tool (transducer) will be moved over your chest. The gel will help transmit the sound waves from the transducer. The sound waves will harmlessly bounce off your heart to allow the heart images to be captured in real-time motion. These images will then be recorded.  You may need an IV to receive a medicine that improves the quality of the pictures. AFTER THE PROCEDURE You may return to your normal schedule including diet, activities, and medicines, unless your health care provider tells you otherwise. Document Released: 04/25/2000 Document Revised: 09/12/2013 Document Reviewed: 01/03/2013 [facility] Patient Information 2015 [place], [place]. This information is not intended to replace advice given to you by your health care provider. Make sure you discuss any questions you have with your health care provider.         Standley Brooking. Panosh M.D.

## 2014-06-15 ENCOUNTER — Encounter: Payer: Self-pay | Admitting: Internal Medicine

## 2014-06-15 ENCOUNTER — Ambulatory Visit (HOSPITAL_COMMUNITY): Payer: No Typology Code available for payment source | Attending: Internal Medicine | Admitting: Radiology

## 2014-06-15 DIAGNOSIS — R030 Elevated blood-pressure reading, without diagnosis of hypertension: Secondary | ICD-10-CM

## 2014-06-15 DIAGNOSIS — I341 Nonrheumatic mitral (valve) prolapse: Secondary | ICD-10-CM | POA: Diagnosis not present

## 2014-06-15 DIAGNOSIS — R0789 Other chest pain: Secondary | ICD-10-CM | POA: Diagnosis present

## 2014-06-15 NOTE — Progress Notes (Signed)
Echocardiogram performed.  

## 2014-06-16 ENCOUNTER — Telehealth: Payer: Self-pay | Admitting: Internal Medicine

## 2014-06-16 NOTE — Telephone Encounter (Signed)
See Result note 

## 2014-06-16 NOTE — Telephone Encounter (Addendum)
Pt needs echocardiogram results. Pt had test yesterday

## 2014-06-17 ENCOUNTER — Encounter: Payer: Self-pay | Admitting: Internal Medicine

## 2014-06-19 NOTE — Telephone Encounter (Signed)
Patient viewed on MyChart.  See MyChart message.

## 2014-06-22 NOTE — Telephone Encounter (Signed)
This still may be premature beats  May we do a cardiology referral?  Also need to know what bp is doing as we may need to add medication for BP   Lisinopril 5-10  mg 1 po qd  In the interim. Before you visit in march

## 2014-06-28 NOTE — Telephone Encounter (Signed)
No need to add bp medication at this time. Based on readings  However proceed with cardiology referral.

## 2014-07-26 ENCOUNTER — Other Ambulatory Visit: Payer: BC Managed Care – PPO

## 2014-08-02 ENCOUNTER — Encounter: Payer: Self-pay | Admitting: Internal Medicine

## 2014-08-02 ENCOUNTER — Ambulatory Visit (INDEPENDENT_AMBULATORY_CARE_PROVIDER_SITE_OTHER): Payer: No Typology Code available for payment source | Admitting: Internal Medicine

## 2014-08-02 VITALS — BP 140/88 | Temp 98.2°F | Ht 62.5 in | Wt 121.5 lb

## 2014-08-02 DIAGNOSIS — Z1211 Encounter for screening for malignant neoplasm of colon: Secondary | ICD-10-CM

## 2014-08-02 DIAGNOSIS — R030 Elevated blood-pressure reading, without diagnosis of hypertension: Secondary | ICD-10-CM | POA: Diagnosis not present

## 2014-08-02 DIAGNOSIS — E785 Hyperlipidemia, unspecified: Secondary | ICD-10-CM

## 2014-08-02 DIAGNOSIS — Z0000 Encounter for general adult medical examination without abnormal findings: Secondary | ICD-10-CM | POA: Diagnosis not present

## 2014-08-02 NOTE — Patient Instructions (Addendum)
Will be contacted about colonoscopy  . Get a mammogram . Continue to monitor blood pressure    If not at goal below 140/90  Can add low dose  bp med  Usually ace inhibitor . Take blood pressure readings twice a day for 10 days and then periodically .To ensure below 140/90   .Send in readings      If needed coronary  Artery calcium score  Can assess further cv risk .  If however getting cardiac sx would get cardiology opinion.    Healthy lifestyle includes : At least 150 minutes of exercise weeks  , weight at healthy levels, which is usually   BMI 19-25. Avoid trans fats and processed foods;  Increase fresh fruits and veges to 5 servings per day. And avoid sweet beverages including tea and juice. Mediterranean diet with olive oil and nuts have been noted to be heart and brain healthy . Avoid tobacco products . Limit  alcohol to  7 per week for women and 14 servings for men.  Get adequate sleep . Wear seat belts . Don't text and drive .   Can get repeat lipid panel in 4-6 months .

## 2014-08-02 NOTE — Progress Notes (Signed)
Pre visit review using our clinic review tool, if applicable. No additional management support is needed unless otherwise documented below in the visit note.  Chief Complaint  Patient presents with  . Annual Exam    HPI: Patient  Vicki Stevens  59 y.o. comes in today for Preventive Health Care visit  Went vegan   For 7 weeks   6  Pounds . After last visit  Feels great exercising well no cp sob sx. Still concern and worry about vascular disease and cholesterol.  Health Maintenance  Topic Date Due  . MAMMOGRAM  08/10/2006  . COLONOSCOPY  08/10/2006  . INFLUENZA VACCINE  08/10/2014 (Originally 12/10/2013)  . HIV Screening  07/11/2015 (Originally 08/10/1970)  . TETANUS/TDAP  05/12/2016  . PAP SMEAR  07/10/2016   Health Maintenance Review LIFESTYLE:  Exercise:  Yes runs  Tobacco/ETS:no Alcohol:  rare Sugar beverages:no Sleep:better Drug use: no Colonoscopy:  Needs one  PAP: 3 15  MAMMO: need mammogram    ROS:  GEN/ HEENT: No fever, significant weight changes sweats headaches vision problems hearing changes, CV/ PULM; No chest pain shortness of breath cough, syncope,edema  change in exercise tolerance. GI /GU: No adominal pain, vomiting, change in bowel habits. No blood in the stool. No significant GU symptoms. SKIN/HEME: ,no acute skin rashes suspicious lesions or bleeding. No lymphadenopathy, nodules, masses.  NEURO/ PSYCH:  No neurologic signs such as weakness numbness. No depression anxiety. IMM/ Allergy: No unusual infections.  Allergy .   REST of 12 system review negative except as per HPI   Past Medical History  Diagnosis Date  . LOW BACK PAIN, ACUTE 03/05/2010  . Hx of varicella   . Heart murmur     evaluation ? mvp when younger  . H/O hematuria remote    dr Vonita MossPeterson felt to be from running     No past surgical history on file.  Family History  Problem Relation Age of Onset  . Lung cancer Father     [id number]  . Other Mother     MVA 5525     History   Social  History  . Marital Status: Married    Spouse Name: N/A  . Number of Children: N/A  . Years of Education: N/A   Social History Main Topics  . Smoking status: Never Smoker   . Smokeless tobacco: Never Used  . Alcohol Use: 0.0 oz/week    0 Standard drinks or equivalent per week  . Drug Use: Not on file  . Sexual Activity: Not on file   Other Topics Concern  . 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 aspirin 81 MG tablet Take 81 mg by mouth daily.  . Multiple Vitamins-Minerals (ADULT GUMMY PO) Take by mouth.    EXAM:  BP 140/88 mmHg  Temp(Src) 98.2 F (36.8 C) (Oral)  Ht 5' 2.5" (1.588 m)  Wt 121 lb 8 oz (55.112 kg)  BMI 21.85 kg/m2  Body mass index is 21.85 kg/(m^2).  Physical Exam: Vital signs reviewed NWG:NFAOGEN:This is a well-developed well-nourished alert cooperative    who appearsr stated age in no acute distress.  HEENT: normocephalic atraumatic , Eyes: PERRL EOM's full, conjunctiva clear, Nares: paten,t no deformity discharge or  tenderness., Ears: no deformity EAC's clear TMs with normal landmarks. Mouth: clear OP, no lesions, edema.  Moist mucous membranes. Dentition in adequate repair. NECK: supple without masses, thyromegaly or bruits. CHEST/PULM:  Clear to auscultation and percussion breath sounds equal no wheeze , rales or rhonchi. No chest wall deformities or tenderness. Breast: normal by inspection . No dimpling, discharge, masses, tenderness or discharge . CV: PMI is nondisplaced, S1 S2 no gallops, murmurs, rubs. Peripheral pulses are full without delay.No JVD .  ABDOMEN: Bowel sounds normal nontender  No guard or rebound, no hepato splenomegal no CVA tenderness.  No hernia. Extremtities:  No clubbing cyanosis or edema, no acute joint swelling or redness no focal  atrophy NEURO:  Oriented x3, cranial nerves 3-12 appear to be intact, no obvious focal weakness,gait within normal limits no abnormal reflexes or asymmetrical SKIN: No acute rashes normal turgor, color, no bruising or petechiae. PSYCH: Oriented, good eye contact, no obvious depression anxiety, cognition and judgment appear normal. LN: no cervical axillary inguinal adenopathy  Lab Results  Component Value Date   WBC 8.1 06/12/2014   HGB 14.5 06/12/2014   HCT 42.2 06/12/2014   PLT 282.0 06/12/2014   GLUCOSE 95 06/12/2014   CHOL 210* 06/12/2014   TRIG 122.0 06/12/2014   HDL 63.60 06/12/2014   LDLCALC 122* 06/12/2014   ALT 23 06/12/2014   AST 23 06/12/2014   NA 140 06/12/2014   K 3.8 06/12/2014   CL 106 06/12/2014   CREATININE 0.58 06/12/2014   BUN 11 06/12/2014   CO2 28 06/12/2014   TSH 1.01 06/12/2014    ASSESSMENT AND PLAN:  Discussed the following assessment and plan:  Hyperlipidemia - minimal continue lsi can recheck in 4-6 months  - Plan: Lipid panel  Visit for preventive health examination - discconcerns cv prevention  cac etc if any sx see cards for assessment  Screen for colon cancer - Plan: Ambulatory referral to Gastroenterology  Elevated blood pressure reading - normal at hoime much better  but monitor  rx as appropriate  disc   Patient Care Team: Madelin Headings, MD as PCP - General Harold Hedge, MD (Obstetrics and Gynecology) Patient Instructions  Will be contacted about colonoscopy  . Get a mammogram . Continue to monitor blood pressure    If not at goal below 140/90  Can add low dose  bp med  Usually ace inhibitor . Take blood pressure readings twice a day for 10 days and then periodically .To ensure below 140/90   .Send in readings      If needed coronary  Artery calcium score  Can assess further cv risk .  If however getting cardiac sx would get cardiology opinion.    Healthy lifestyle includes : At least 150 minutes of exercise weeks  , weight at  healthy levels, which is usually   BMI 19-25. Avoid trans fats and processed foods;  Increase fresh fruits and veges to 5 servings per day. And avoid sweet beverages including tea and juice. Mediterranean diet with olive oil and nuts have been noted to be heart and brain healthy . Avoid tobacco products . Limit  alcohol to  7 per week for women and 14 servings for men.  Get adequate sleep . Wear seat belts . Don't text and drive .   Can get repeat lipid panel in 4-6 months .     [name]. Dua Mehler M.D.

## 2014-09-04 ENCOUNTER — Encounter: Payer: Self-pay | Admitting: Gastroenterology

## 2016-06-30 ENCOUNTER — Encounter: Payer: Self-pay | Admitting: Internal Medicine

## 2016-06-30 ENCOUNTER — Telehealth: Payer: Self-pay | Admitting: Internal Medicine

## 2016-06-30 ENCOUNTER — Ambulatory Visit (INDEPENDENT_AMBULATORY_CARE_PROVIDER_SITE_OTHER): Payer: BLUE CROSS/BLUE SHIELD | Admitting: Internal Medicine

## 2016-06-30 VITALS — BP 162/100 | Temp 98.0°F | Wt 130.0 lb

## 2016-06-30 DIAGNOSIS — M549 Dorsalgia, unspecified: Secondary | ICD-10-CM | POA: Diagnosis not present

## 2016-06-30 DIAGNOSIS — R030 Elevated blood-pressure reading, without diagnosis of hypertension: Secondary | ICD-10-CM | POA: Diagnosis not present

## 2016-06-30 LAB — POC URINALSYSI DIPSTICK (AUTOMATED)
Bilirubin, UA: NEGATIVE
Glucose, UA: NEGATIVE
KETONES UA: NEGATIVE
Leukocytes, UA: NEGATIVE
NITRITE UA: NEGATIVE
PH UA: 7
PROTEIN UA: NEGATIVE
RBC UA: NEGATIVE
Spec Grav, UA: 1.015
Urobilinogen, UA: 0.2

## 2016-06-30 MED ORDER — LISINOPRIL 10 MG PO TABS: 10.0000 mg | ORAL_TABLET | Freq: Every day | ORAL | 1 refills | Status: DC

## 2016-06-30 NOTE — Progress Notes (Signed)
Chief Complaint  Patient presents with  . Left Side Lower Back Pain    X2wks    HPI: Vicki Stevens 61 y.o.  [place]     Has had 2 weeks of back pain left lower  Back pain insidious onset  Achy   And now uncomfortable at night   .  Over 2 weeks no uti sx no fever   But felt achy today so didn't do her regular running   And has been on  An  antibiotic  For dental procedures .  [place]   Did take Advil Aleve products at that time but nothing now new medicines except B12 occasionally. She is a runner no chest pain shortness of breath vomiting. She states that her blood pressure when she checked it in January or couple months ago was at goal and the oral surgeon didn't say anything about elevation. She has no family history of hypertension us apprised that her reading today. Doesn't feel like it's white coat hypertension. Neg tadx mind etoh  ROS: See pertinent positives and negatives per HPI. No cv pulm sx  Etc   Past Medical History:  Diagnosis Date  . H/O hematuria remote   dr Vonita MossPeterson felt to be from running   . Heart murmur    evaluation ? mvp when younger  . Hx of varicella   . LOW BACK PAIN, ACUTE 03/05/2010    Family History  Problem Relation Age of Onset  . Lung cancer Father     2437  . Other Mother     MVA 8125     Social History   Social History  . Marital status: Married    Spouse name: N/A  . Number of children: N/A  . Years of education: N/A   Social History Main Topics  . Smoking status: Never Smoker  . Smokeless tobacco: Never Used  . Alcohol use 0.0 oz/week  . Drug use: Unknown  . Sexual activity: Not Asked   Other Topics Concern  . 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 Multiple Vitamins-Minerals  (ADULT GUMMY PO) Take by mouth.    Marland Kitchen. aspirin 81 MG tablet Take 81 mg by mouth daily.     No facility-administered medications prior to visit.      EXAM:  BP (!) 162/100 (BP Location: Left Arm)   Temp 98 F (36.7 C) (Oral)   Wt 130 lb (59 kg)   BMI 23.40 kg/m   Body mass index is 23.4 kg/m.  GENERAL: vitals reviewed and listed above, alert, oriented, appears well hydrated and in no acute distress HEENT: atraumatic, conjunctiva  clear, no obvious abnormalities on inspection of external nose and ears \\NECK : no obvious masses on inspection palpation  LUNGS: clear to auscultation bilaterally, no wheezes, rales or rhonchi, good air movement CV: HRRR, no clubbing cyanosis or  peripheral edema nl cap refill  Abdomen:  Sof,t normal bowel sounds without hepatosplenomegaly, no guarding rebound or masses no CVA tenderness area of discomfort is left lower superior the iliac crest on the left. No midline tenderness no psoas sign. No radiation. Negative leg lifts.  MS: moves all extremities without noticeable focal  abnormality PSYCH: pleasant and cooperative, no obvious depression or anxiety BP Readings from Last 3 Encounters:  06/30/16 (!) 162/100  08/02/14 140/88  06/12/14 (!) 150/96   Wt Readings from Last 3 Encounters:  06/30/16 130 lb (59 kg)  08/02/14 121 lb 8 oz (55.1 kg)  06/12/14 119 lb 4.8 oz (54.1 kg)   Lab Results  Component Value Date   WBC 8.1 06/12/2014   HGB 14.5 06/12/2014   HCT 42.2 06/12/2014   PLT 282.0 06/12/2014   GLUCOSE 95 06/12/2014   CHOL 210 (H) 06/12/2014   TRIG 122.0 06/12/2014   HDL 63.60 06/12/2014   LDLCALC 122 (H) 06/12/2014   ALT 23 06/12/2014   AST 23 06/12/2014   NA 140 06/12/2014   K 3.8 06/12/2014   CL 106 06/12/2014   CREATININE 0.58 06/12/2014   BUN 11 06/12/2014   CO2 28 06/12/2014   TSH 1.01 06/12/2014    ASSESSMENT AND PLAN:  Discussed the following assessment and plan:  Back pain, unspecified back location, unspecified back  pain laterality, unspecified chronicity - Plan: POCT Urinalysis Dipstick (Automated), CBC with Differential/Platelet, Comprehensive metabolic panel, Sedimentation rate, US Renal  Elevated blood pressure reading - Plan: CBC with Differential/Platelet, Comprehensive metabolic panel, Sedimentation rate, US Renal Severity of elevations consistent with hypertension even if white coat baseline must be up. Haven't seen her in every 2 years that she has a reasonably healthy lifestylesignificant excess alcohol runs negative family history. Discomfort sounds mechanical because of its localization and no associated symptoms for blood pressure elevation is worrisome. Check chemistries CBC today plan renal ultrasound limitations of test discussed with patient Prescription for low-dose lisinopril given and have her use my chart send in readings follow-up visit in 3 weeks with her monitor more aggressive treatment as appropriate. If her blood pressures and indeed at high regular patient she will need combination therapy. Consideration for secondary causes. -Patient advised to return or notify health care team  if symptoms worsen ,persist or new concerns arise.  Patient Instructions  Will notify you  of labs when available.   The area of your pain is a little opening kidney but I agree I can't explain it otherwise although usually mechanical  Your blood pressure is too high even for white coat hypertension. Need follow-up about your blood pressure consider medication if indeed elevated home  Consideration of getting a renal ultrasound which looks at the kidneys from sound waves to look at size. Your urine test is normal today .   ROV ini 3 weeks    After lab  bp evalutation and renal ultrasound done.    [name]. Panosh M.D.

## 2016-06-30 NOTE — Telephone Encounter (Signed)
How about  4 15 today  As a work in .  ?

## 2016-06-30 NOTE — Patient Instructions (Addendum)
Will notify you  of labs when available.   The area of your pain is a little opening kidney but I agree I can't explain it otherwise although usually mechanical  Your blood pressure is too high even for white coat hypertension. Need follow-up about your blood pressure consider medication if indeed elevated home  Consideration of getting a renal ultrasound which looks at the kidneys from sound waves to look at size. Your urine test is normal today .   ROV ini 3 weeks    After lab  bp evalutation and renal ultrasound done.

## 2016-06-30 NOTE — Telephone Encounter (Signed)
Pt has lower back pain/ possible kidney infection.  Would like to see Dr Fabian SharpPanosh before she sees anyone else if she can. Preferred I send a message to ask. [facility] Dr Fabian SharpPanosh at 3:30 pm  Pt request today or tomorrow am  If possible.   Pt not seen since 07/2014.   Please advise if OK to work pt in. Or pt se another provider in the am.

## 2016-06-30 NOTE — Progress Notes (Signed)
Pre visit review using our clinic review tool, if applicable. No additional management support is needed unless otherwise documented below in the visit note. 

## 2016-06-30 NOTE — Telephone Encounter (Signed)
Pt will see you today at the 4:15 appt time, and very appreciative!!

## 2016-07-01 LAB — COMPREHENSIVE METABOLIC PANEL
ALBUMIN: 4.3 g/dL (ref 3.5–5.2)
ALK PHOS: 59 U/L (ref 39–117)
ALT: 20 U/L (ref 0–35)
AST: 19 U/L (ref 0–37)
BUN: 8 mg/dL (ref 6–23)
CO2: 31 mEq/L (ref 19–32)
Calcium: 9.3 mg/dL (ref 8.4–10.5)
Chloride: 105 mEq/L (ref 96–112)
Creatinine, Ser: 0.48 mg/dL (ref 0.40–1.20)
GFR: 140.27 mL/min (ref >60.00)
Glucose, Bld: 98 mg/dL (ref 70–99)
POTASSIUM: 3.9 meq/L (ref 3.5–5.1)
Sodium: 141 mEq/L (ref 135–145)
Total Bilirubin: 0.4 mg/dL (ref 0.2–1.2)
Total Protein: 6.4 g/dL (ref 6.0–8.3)

## 2016-07-01 LAB — CBC WITH DIFFERENTIAL/PLATELET
BASOS PCT: 0.7 % (ref 0.0–3.0)
Basophils Absolute: 0 10*3/uL (ref 0.0–0.1)
EOS PCT: 9 % — AB (ref 0.0–5.0)
Eosinophils Absolute: 0.4 10*3/uL (ref 0.0–0.7)
HCT: 40.4 % (ref 36.0–46.0)
HEMOGLOBIN: 13.8 g/dL (ref 12.0–15.0)
LYMPHS ABS: 1.6 10*3/uL (ref 0.7–4.0)
Lymphocytes Relative: 33.4 % (ref 12.0–46.0)
MCHC: 34.1 g/dL (ref 30.0–36.0)
MCV: 91.3 fl (ref 78.0–100.0)
MONOS PCT: 6.8 % (ref 3.0–12.0)
Monocytes Absolute: 0.3 10*3/uL (ref 0.1–1.0)
Neutro Abs: 2.5 10*3/uL (ref 1.4–7.7)
Neutrophils Relative %: 50.1 % (ref 43.0–77.0)
Platelets: 276 10*3/uL (ref 150.0–400.0)
RBC: 4.43 Mil/uL (ref 3.87–5.11)
RDW: 12.8 % (ref 11.5–15.5)
WBC: 4.9 10*3/uL (ref 4.0–10.5)

## 2016-07-01 LAB — SEDIMENTATION RATE: SED RATE: 5 mm/h (ref 0–30)

## 2016-07-03 ENCOUNTER — Encounter: Payer: Self-pay | Admitting: Internal Medicine

## 2016-07-22 ENCOUNTER — Encounter: Payer: Self-pay | Admitting: Internal Medicine

## 2016-07-22 ENCOUNTER — Ambulatory Visit (INDEPENDENT_AMBULATORY_CARE_PROVIDER_SITE_OTHER): Payer: BLUE CROSS/BLUE SHIELD | Admitting: Internal Medicine

## 2016-07-22 VITALS — BP 180/98 | HR 60 | Temp 97.5°F | Ht 62.5 in | Wt 129.9 lb

## 2016-07-22 DIAGNOSIS — R030 Elevated blood-pressure reading, without diagnosis of hypertension: Secondary | ICD-10-CM | POA: Diagnosis not present

## 2016-07-22 DIAGNOSIS — I10 Essential (primary) hypertension: Secondary | ICD-10-CM

## 2016-07-22 NOTE — Patient Instructions (Addendum)
Stay on medication   Goal is 120/80 range  Long term at baseline.   Your monitor is good enough to base treatment intervention on .  Look at hidden sodium .  To reduce .   Consider checking in to cologuard   As a sensitive screen fore colon cancer . Check to see coverage from   Insurance plan .   Plan  rov OR  CPX prevenetive in about 3-4 months .

## 2016-07-22 NOTE — Progress Notes (Signed)
Chief Complaint  Patient presents with  . Follow-up    HPI: Vicki Stevens 61 y.o.   Fu bp elevation and see last visit  Her back pain subsided in a couple days with no other symptoms. She got a monitor began the lisinopril 10 mg a day without side effects. Still able to run no difficulty. She shows blood pressure readings at home in the 05/29/1978 something range up to 138/88 range although one reading was 140.  She brings her monitor in today to review.   See below last visit  eavl  Back pain, unspecified back location, unspecified back pain laterality, unspecified chronicity - Plan: POCT Urinalysis Dipstick (Automated), CBC with Differential/Platelet, Comprehensive metabolic panel, Sedimentation rate, US Renal  Elevated blood pressure reading - Plan: CBC with Differential/Platelet, Comprehensive metabolic panel, Sedimentation rate, US Renal Severity of elevations consistent with hypertension even if white coat baseline must be up. Haven't seen her in every 2 years that she has a reasonably healthy lifestylesignificant excess alcohol runs negative family history. Discomfort sounds mechanical because of its localization and no associated symptoms for blood pressure elevation is worrisome. Check chemistries CBC today plan renal ultrasound limitations of test discussed with patient Prescription for low-dose lisinopril given and have her use my chart send in readings follow-up visit in 3 weeks with her monitor more aggressive treatment as appropriate. If her blood pressures and indeed at high regular patient she will need combination therapy. Consideration for secondary causes. -Patient advised to return or notify health care team  if symptoms worsen ,persist or new concerns arise.  Patient Instructions  Will notify you  of labs when available.   The area of your pain is a little opening kidney but I agree I can't explain it otherwise although usually mechanical  Your blood pressure  is too high even for white coat hypertension. Need follow-up about your blood pressure consider medication if indeed elevated home  Consideration of getting a renal ultrasound which looks at the kidneys from sound waves to look at size. Your urine test is normal today .   ROV ini 3 weeks    After lab  bp evalutation and renal ultrasound done.    [name]. Panosh M.D.    Progress Notes by Nils Flack, CMA at 06/30/2016 4:15 PM          ROS: See pertinent positives and negatives per HPI.  Past Medical History:  Diagnosis Date  . H/O hematuria remote   dr Vonita Moss felt to be from running   . Heart murmur    evaluation ? mvp when younger  . Hx of varicella   . LOW BACK PAIN, ACUTE 03/05/2010    Family History  Problem Relation Age of Onset  . Lung cancer Father     85  . Other Mother     MVA 89     Social History   Social History  . Marital status: Married    Spouse name: N/A  . Number of children: N/A  . Years of education: N/A   Social History Main Topics  . Smoking status: Never Smoker  . Smokeless tobacco: Never Used  . Alcohol use 0.0 oz/week  . Drug use: Unknown  . Sexual activity: Not Asked   Other Topics Concern  . 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 lisinopril (PRINIVIL,ZESTRIL) 10 MG tablet Take 1 tablet (10 mg total) by mouth daily. 90 tablet 1  . Multiple Vitamins-Minerals (ADULT GUMMY PO) Take by mouth.    . Cyanocobalamin (B-12 PO) Take by mouth.     No facility-administered medications prior to visit.      EXAM:  BP (!) 180/98 (BP Location: Right Arm, Patient Position: Sitting, Cuff Size: Normal)   Pulse 60   Temp 97.5 F (36.4 C) (Oral)   Ht 5' 2.5" (1.588 m)   Wt 129 lb 14.4 oz (58.9 kg)   BMI 23.38 kg/m   Body mass index  is 23.38 kg/m. Blood pressure readings 165/107 158/100 right and left repeat 150/86 right office cuff. GENERAL: vitals reviewed and listed above, alert, oriented, appears well hydrated and in no acute distress HEENT: atraumatic, conjunctiva  clear, no obvious abnormalities on inspection of external nose and ears PSYCH: pleasant and cooperative, no obvious depression or anxiety Lab Results  Component Value Date   WBC 4.9 06/30/2016   HGB 13.8 06/30/2016   HCT 40.4 06/30/2016   PLT 276.0 06/30/2016   GLUCOSE 98 06/30/2016   CHOL 210 (H) 06/12/2014   TRIG 122.0 06/12/2014   HDL 63.60 06/12/2014   LDLCALC 122 (H) 06/12/2014   ALT 20 06/30/2016   AST 19 06/30/2016   NA 141 06/30/2016   K 3.9 06/30/2016   CL 105 06/30/2016   CREATININE 0.48 06/30/2016   BUN 8 06/30/2016   CO2 31 06/30/2016   TSH 1.01 06/12/2014   BP Readings from Last 3 Encounters:  07/22/16 (!) 180/98  06/30/16 (!) 162/100  08/02/14 140/88  Blood work reviewed with patient  ASSESSMENT AND PLAN:  Discussed the following assessment and plan:  Elevated blood pressure reading - home readings below 140/90   White coat syndrome with hypertension ? - Baseline was slightly up before medicine began with new guidelines will remain on medication follow-up. Blood pressure monitor is good enough to make decisions from very high in the office although coming down and almost at goal at home. She's only been on medication for 3 weeks. We'll look at the sodium in diet optimizing lifestyle plan follow-up visit in 3-4 months either as an office visit or preventive visit. She will get in contact with her GYN when she is due. Hasn't done colon cancer screening prefers no colonoscopy has a negative family history information given about cologuard. Review your goals for blood pressure control. Since back pain felt to be mechanical go got better pretty quickly and her labs and urinalysis were normal and her baseline low pressures at  home are much better will not do renal ultrasound at this time. -Patient advised to return or notify health care team  if symptoms worsen ,persist or new concerns arise.  Patient Instructions  Stay on medication   Goal is 120/80 range  Long term at baseline.   Your monitor is good enough to base treatment intervention on .  Look at hidden sodium .  To reduce .   Consider checking in to cologuard   As a sensitive screen fore colon cancer . Check to see coverage from   Insurance plan .   Plan  rov OR  CPX prevenetive in about 3-4 months .      Neta MendsWanda K. Panosh M.D.

## 2016-07-24 ENCOUNTER — Ambulatory Visit: Payer: BLUE CROSS/BLUE SHIELD | Admitting: Internal Medicine

## 2016-08-06 ENCOUNTER — Encounter: Payer: Self-pay | Admitting: Internal Medicine

## 2017-01-30 ENCOUNTER — Encounter: Payer: Self-pay | Admitting: Internal Medicine

## 2019-03-25 ENCOUNTER — Telehealth: Payer: BLUE CROSS/BLUE SHIELD | Admitting: Internal Medicine

## 2019-03-28 NOTE — Telephone Encounter (Signed)
Called pt to set up for virtual visit pt made appt but then later canceled

## 2019-05-13 DIAGNOSIS — U071 COVID-19: Secondary | ICD-10-CM

## 2019-05-13 HISTORY — DX: COVID-19: U07.1

## 2020-02-09 ENCOUNTER — Other Ambulatory Visit: Payer: Self-pay

## 2020-02-09 DIAGNOSIS — Z20822 Contact with and (suspected) exposure to covid-19: Secondary | ICD-10-CM

## 2020-02-10 LAB — NOVEL CORONAVIRUS, NAA: SARS-CoV-2, NAA: NOT DETECTED

## 2020-02-10 LAB — SARS-COV-2, NAA 2 DAY TAT

## 2020-04-17 ENCOUNTER — Other Ambulatory Visit: Payer: Self-pay

## 2020-04-17 ENCOUNTER — Emergency Department (HOSPITAL_COMMUNITY): Payer: 59

## 2020-04-17 ENCOUNTER — Observation Stay (HOSPITAL_COMMUNITY)
Admission: EM | Admit: 2020-04-17 | Discharge: 2020-04-19 | Disposition: A | Discharge status: Home / Self Care | Payer: 59 | Attending: Internal Medicine | Admitting: Internal Medicine

## 2020-04-17 DIAGNOSIS — I639 Cerebral infarction, unspecified: Secondary | ICD-10-CM

## 2020-04-17 DIAGNOSIS — R202 Paresthesia of skin: Secondary | ICD-10-CM | POA: Diagnosis present

## 2020-04-17 DIAGNOSIS — Z20822 Contact with and (suspected) exposure to covid-19: Secondary | ICD-10-CM | POA: Diagnosis not present

## 2020-04-17 DIAGNOSIS — I10 Essential (primary) hypertension: Secondary | ICD-10-CM | POA: Diagnosis present

## 2020-04-17 HISTORY — DX: Essential (primary) hypertension: I10

## 2020-04-17 HISTORY — DX: Cerebral infarction, unspecified: I63.9

## 2020-04-17 LAB — COMPREHENSIVE METABOLIC PANEL
ALT: 17 U/L (ref 0–44)
AST: 19 U/L (ref 15–41)
Albumin: 3.8 g/dL (ref 3.5–5.0)
Alkaline Phosphatase: 63 U/L (ref 38–126)
Anion gap: 9 (ref 5–15)
BUN: 18 mg/dL (ref 8–23)
CO2: 29 mmol/L (ref 22–32)
Calcium: 9.4 mg/dL (ref 8.9–10.3)
Chloride: 102 mmol/L (ref 98–111)
Creatinine, Ser: 0.59 mg/dL (ref 0.44–1.00)
GFR, Estimated: >60 mL/min (ref >60)
Glucose, Bld: 113 mg/dL — ABNORMAL HIGH (ref 70–99)
Potassium: 3.8 mmol/L (ref 3.5–5.1)
Sodium: 140 mmol/L (ref 135–145)
Total Bilirubin: 0.5 mg/dL (ref 0.3–1.2)
Total Protein: 6.2 g/dL — ABNORMAL LOW (ref 6.5–8.1)

## 2020-04-17 LAB — CBC
HCT: 42.3 % (ref 36.0–46.0)
Hemoglobin: 14.4 g/dL (ref 12.0–15.0)
MCH: 31.5 pg (ref 26.0–34.0)
MCHC: 34 g/dL (ref 30.0–36.0)
MCV: 92.6 fL (ref 80.0–100.0)
Platelets: 292 10*3/uL (ref 150–400)
RBC: 4.57 MIL/uL (ref 3.87–5.11)
RDW: 12.3 % (ref 11.5–15.5)
WBC: 7.7 10*3/uL (ref 4.0–10.5)
nRBC: 0 % (ref 0.0–0.2)

## 2020-04-17 LAB — DIFFERENTIAL
Abs Immature Granulocytes: 0.03 10*3/uL (ref 0.00–0.07)
Basophils Absolute: 0 10*3/uL (ref 0.0–0.1)
Basophils Relative: 1 %
Eosinophils Absolute: 0.4 10*3/uL (ref 0.0–0.5)
Eosinophils Relative: 5 %
Immature Granulocytes: 0 %
Lymphocytes Relative: 20 %
Lymphs Abs: 1.6 10*3/uL (ref 0.7–4.0)
Monocytes Absolute: 0.5 10*3/uL (ref 0.1–1.0)
Monocytes Relative: 6 %
Neutro Abs: 5.2 10*3/uL (ref 1.7–7.7)
Neutrophils Relative %: 68 %

## 2020-04-17 LAB — I-STAT CHEM 8, ED
BUN: 20 mg/dL (ref 8–23)
Calcium, Ion: 1.18 mmol/L (ref 1.15–1.40)
Chloride: 103 mmol/L (ref 98–111)
Creatinine, Ser: 0.5 mg/dL (ref 0.44–1.00)
Glucose, Bld: 110 mg/dL — ABNORMAL HIGH (ref 70–99)
HCT: 42 % (ref 36.0–46.0)
Hemoglobin: 14.3 g/dL (ref 12.0–15.0)
Potassium: 3.8 mmol/L (ref 3.5–5.1)
Sodium: 141 mmol/L (ref 135–145)
TCO2: 27 mmol/L (ref 22–32)

## 2020-04-17 LAB — CBG MONITORING, ED: Glucose-Capillary: 94 mg/dL (ref 70–99)

## 2020-04-17 LAB — PROTIME-INR
INR: 1 (ref 0.8–1.2)
Prothrombin Time: 13.2 seconds (ref 11.4–15.2)

## 2020-04-17 LAB — APTT: aPTT: 28 seconds (ref 24–36)

## 2020-04-17 IMAGING — MR MR MRA NECK WO/W CM
7 of 11 series · 36 of 48 positions shown · IV contrast (gadavist)
Comparison: Prior head CT from earlier the same day.

CLINICAL DATA: Initial evaluation for acute neuro deficit, acute
stroke suspected. Transient numbness and vibratory sensation
involving the left-sided body. Headache.

EXAM:
MRI HEAD WITHOUT CONTRAST
MRA HEAD WITHOUT CONTRAST
MRA NECK WITHOUT AND WITH CONTRAST
TECHNIQUE: Multiplanar, multiecho pulse sequences of the brain and surrounding
structures were obtained without intravenous contrast. Angiographic
images of the Circle of Willis were obtained using MRA technique
without intravenous contrast. Angiographic images of the neck were
obtained using MRA technique without and with intravenous contrast.
Carotid stenosis measurements (when applicable) are obtained
utilizing NASCET criteria, using the distal internal carotid
diameter as the denominator.
CONTRAST:  5.8mL GADAVIST GADOBUTROL 1 MMOL/ML IV SOLN

[Series 10: tof_fl3d_tra_iso · axial · 0.6mm · 0.52mm/px · z∈[-186,-111]mm · 5 of 133 slices shown (1 of 2)]
[im 1/133]
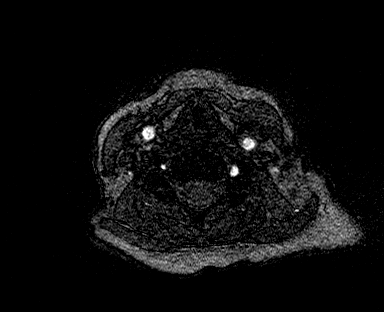
[im 34/133]
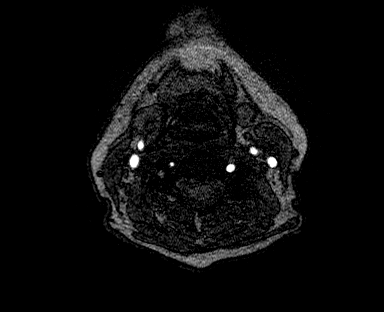
[im 67/133]
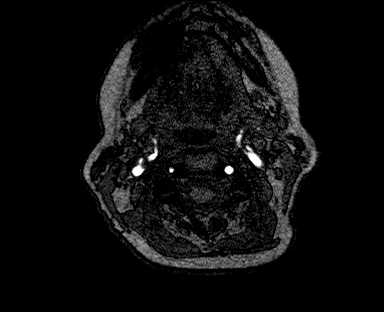
[im 100/133]
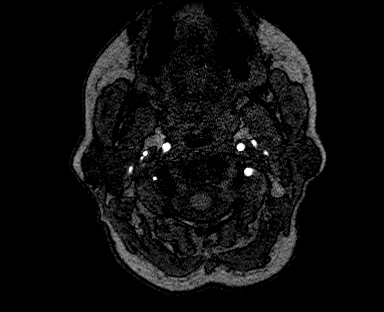
[im 133/133]
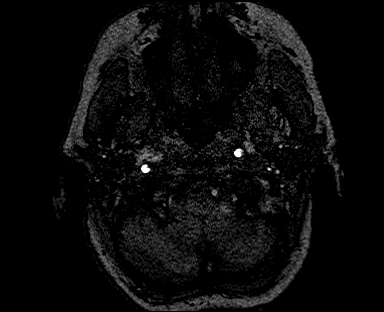

[Series 13: tof_fl3d_tra_iso · axial · 0.6mm · 0.52mm/px · z∈[-213,-138]mm · 6 of 133 slices shown (2 of 2)]
[im 1/133]
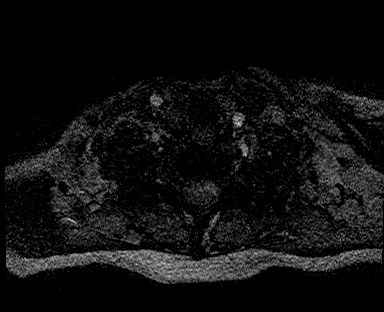
[im 27/133]
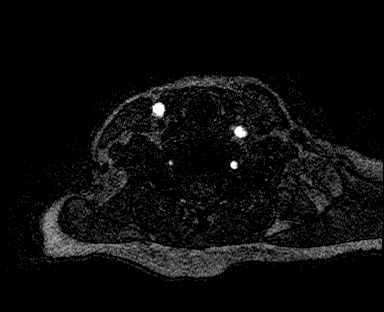
[im 53/133]
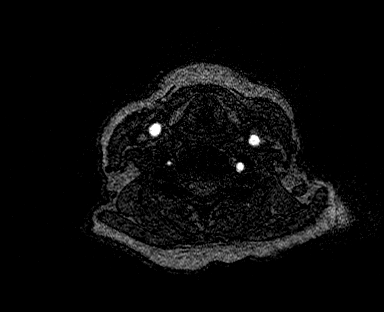
[im 80/133]
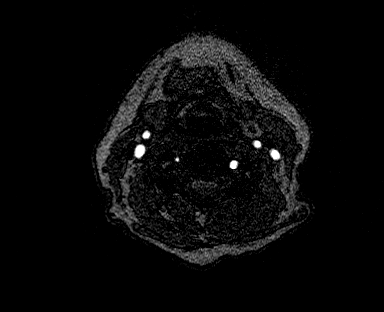
[im 106/133]
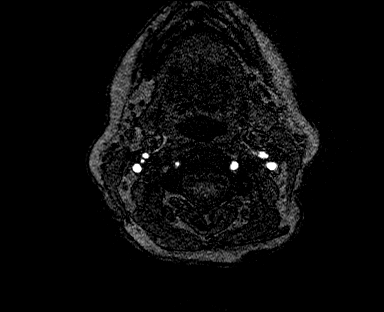
[im 133/133]
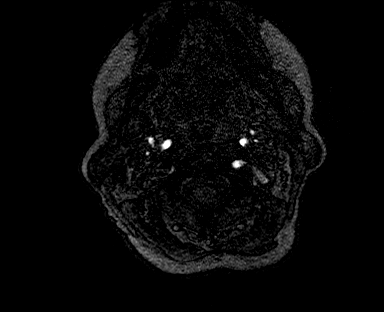

[Series 16: angio_fl3d_cor_pre_ttc=3.0s · coronal · 0.9mm · 0.85mm/px · 5 of 96 slices shown]
[im 1/96]
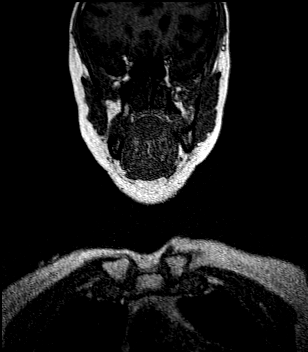
[im 24/96]
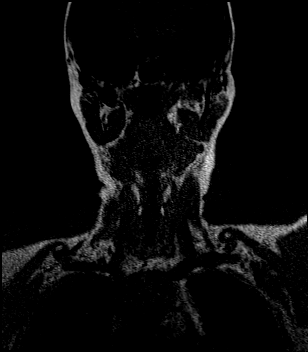
[im 48/96]
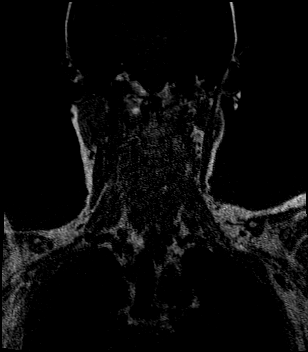
[im 72/96]
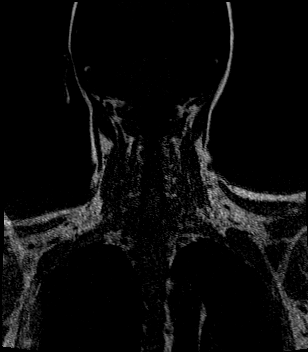
[im 96/96]
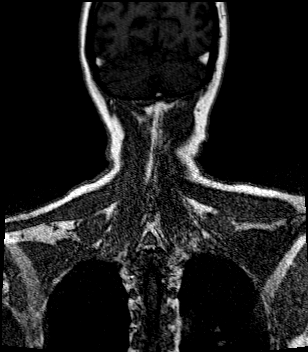

[Series 18: angio_fl3d_cor_post_ttc=3.0s · coronal · 0.9mm · 0.85mm/px · 5 of 96 slices shown (1 of 2)]
[im 1/96]
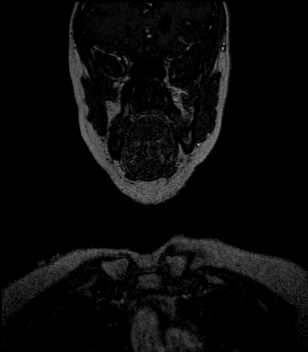
[im 24/96]
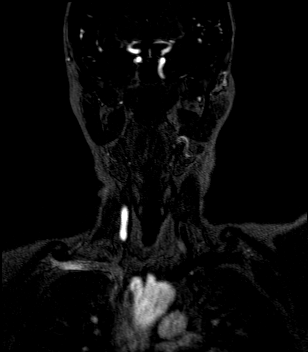
[im 48/96]
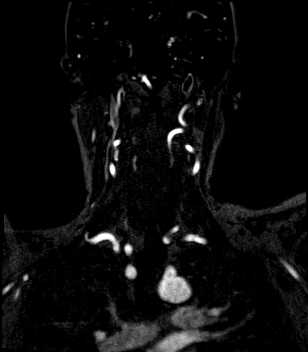
[im 72/96]
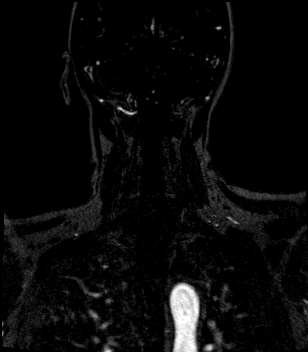
[im 96/96]
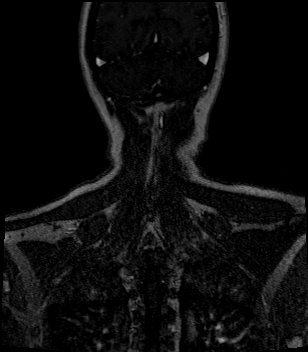

[Series 19: angio_fl3d_cor_post_ttc=3.0s_moco-adv · coronal · 0.9mm · 0.85mm/px · 5 of 96 slices shown]
[im 1/96]
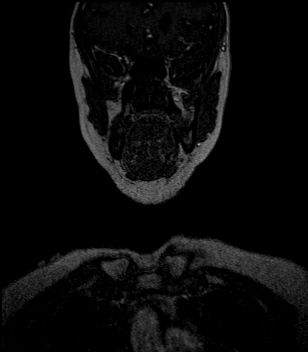
[im 24/96]
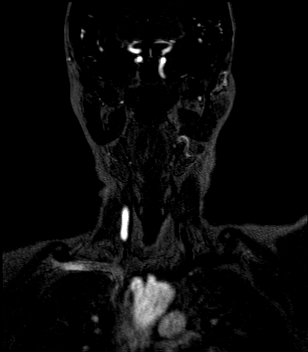
[im 48/96]
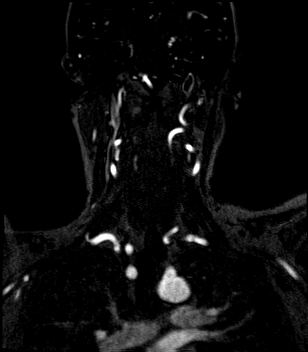
[im 72/96]
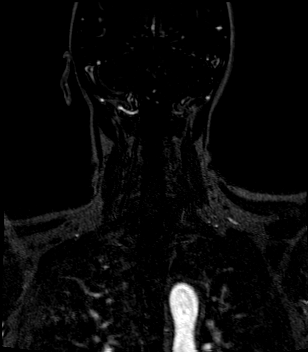
[im 96/96]
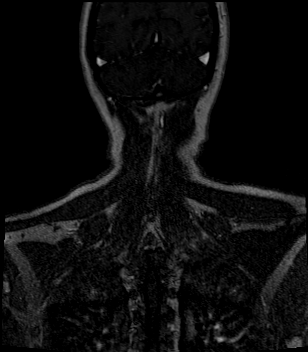

[Series 20: angio_fl3d_cor_post_ttc=3.0s_moco-adv_sub · coronal · 0.9mm · 0.85mm/px · 5 of 96 slices shown]
[im 1/96]
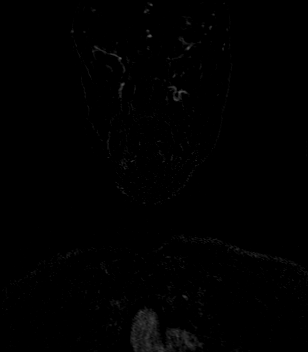
[im 24/96]
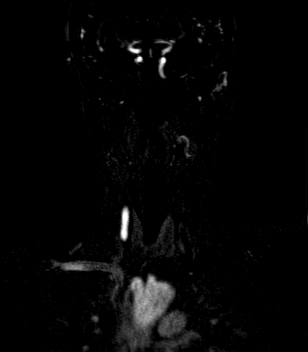
[im 48/96]
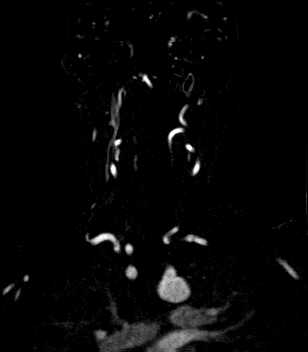
[im 72/96]
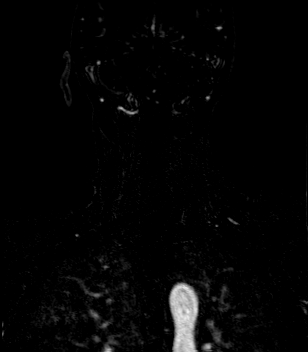
[im 96/96]
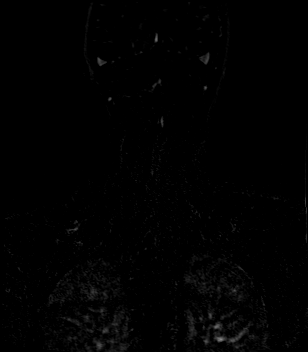

[Series 22: angio_fl3d_cor_post_ttc=3.0s · coronal · 0.9mm · 0.85mm/px · 5 of 96 slices shown (2 of 2)]
[im 1/96]
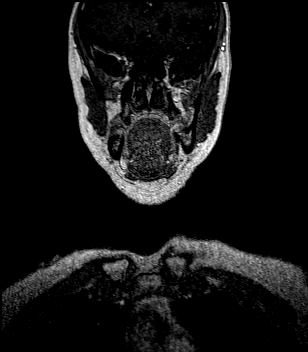
[im 24/96]
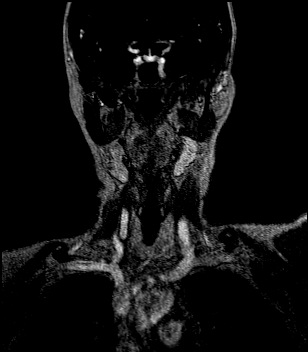
[im 48/96]
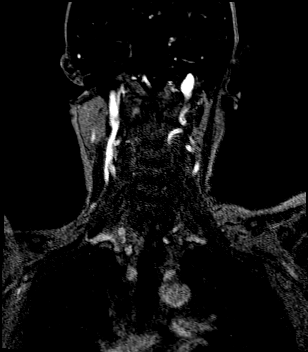
[im 72/96]
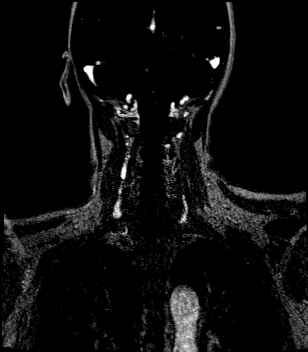
[im 96/96]
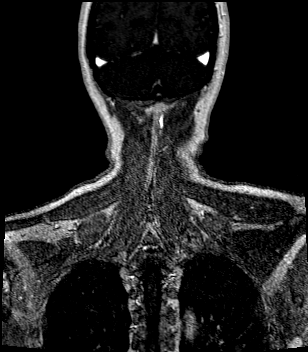

[36 of 48 positions shown; findings below may reference images not displayed]

FINDINGS: MRI HEAD FINDINGS

Brain: Cerebral volume within normal limits for patient age. Mild
patchy T2/FLAIR hyperintensity seen within the periventricular white
matter and pons, most likely related chronic small vessel ischemic
disease, mild in nature. Probable small remote lacunar infarct noted
at the left midbrain (series 16, image 23).

6 mm focus of restricted diffusion seen involving the cortical gray
matter of the right occipital lobe, consistent with an acute
ischemic infarct (series 5, image 73), right PCA distribution. No
associated hemorrhage or mass effect. No other diffusion abnormality
to suggest acute or subacute ischemia. Gray-white matter
differentiation otherwise maintained. No encephalomalacia to suggest
chronic cortical infarction elsewhere within the brain. No foci of
susceptibility artifact to suggest acute or chronic intracranial
hemorrhage.

No mass lesion, midline shift or mass effect. No hydrocephalus. No
extra-axial fluid collection. Major dural sinuses are grossly
patent.

Pituitary gland and suprasellar region are normal. Midline
structures intact and normal.

Vascular: Major intracranial vascular flow voids well maintained and
normal in appearance.

Skull and upper cervical spine: Craniocervical junction normal.
Visualized upper cervical spine within normal limits. Bone marrow
signal intensity normal. No scalp soft tissue abnormality.

Sinuses/Orbits: Globes and orbital soft tissues within normal
limits.

Minimal mucosal thickening noted within the ethmoidal air cells.
Paranasal sinuses are otherwise clear. No mastoid effusion. Inner
ear structures grossly normal.

Other: None.

MRA HEAD FINDINGS

ANTERIOR CIRCULATION:

Visualized distal cervical segments of the internal carotid arteries
are patent with symmetric antegrade flow. Petrous, cavernous, and
supraclinoid segments widely patent without stenosis or other
abnormality. A1 segments patent bilaterally. Normal anterior
communicating artery complex. Anterior cerebral arteries patent to
their distal aspects without stenosis. No M1 stenosis or occlusion.
Normal MCA bifurcations. Distal MCA branches well perfused and
symmetric.

POSTERIOR CIRCULATION:

Dominant left V4 segment widely patent without stenosis. Patent left
PICA origin. Right V4 segment diffusely hypoplastic but patent as
well. Right PICA not seen. Basilar patent to its distal aspect
without stenosis. Superior cerebral arteries patent bilaterally.
Both PCAs primarily supplied via the basilar. Left PCA widely patent
and well perfused to its distal aspect. There is apparent occlusion
of the proximal right P2 segment (series [FM], image 12). No
significant flow within the right PCA distribution distally evident
by MRA.

No intracranial aneurysm.

MRA NECK FINDINGS

AORTIC ARCH: Visualized aortic arch of normal caliber with normal 3
vessel morphology. No hemodynamically significant stenosis seen
about the origin of the great vessels.

RIGHT CAROTID SYSTEM: Right common and internal carotid arteries
widely patent without stenosis, evidence for dissection, or
occlusion. No significant atheromatous irregularity or narrowing
about the right carotid bifurcation.

LEFT CAROTID SYSTEM: Left common and internal carotid arteries
widely patent without stenosis, evidence for dissection or
occlusion. No significant atheromatous irregularity or narrowing
about the left carotid bifurcation.

VERTEBRAL ARTERIES: Both vertebral arteries arise from the
subclavian arteries. Strongly dominant left vertebral artery with a
diffusely hypoplastic right vertebral artery. Vertebral arteries
widely patent within the neck without stenosis, evidence for
dissection, or occlusion.
IMPRESSION: MRI HEAD IMPRESSION:

1. 6 mm acute ischemic nonhemorrhagic cortical infarct involving the
right occipital lobe, right PCA distribution.
2. Underlying mild chronic microvascular ischemic disease for age.

MRA HEAD IMPRESSION:

1. Proximal right P2 occlusion, consistent with the acute right PCA
territory infarct. There may be an underlying chronic stenosis at
this location given the relative small size of the infarct.
2. Otherwise normal intracranial MRA. No other large vessel
occlusion, hemodynamically significant stenosis, or other acute
vascular abnormality.

MRA NECK IMPRESSION:

Normal MRA of the neck.

## 2020-04-17 IMAGING — MR MR HEAD W/O CM
12 of 13 series · 43 of 48 positions shown · IV contrast (gadavist)
Comparison: Prior head CT from earlier the same day.

CLINICAL DATA: Initial evaluation for acute neuro deficit, acute
stroke suspected. Transient numbness and vibratory sensation
involving the left-sided body. Headache.

EXAM:
MRI HEAD WITHOUT CONTRAST
MRA HEAD WITHOUT CONTRAST
MRA NECK WITHOUT AND WITH CONTRAST
TECHNIQUE: Multiplanar, multiecho pulse sequences of the brain and surrounding
structures were obtained without intravenous contrast. Angiographic
images of the Circle of Willis were obtained using MRA technique
without intravenous contrast. Angiographic images of the neck were
obtained using MRA technique without and with intravenous contrast.
Carotid stenosis measurements (when applicable) are obtained
utilizing NASCET criteria, using the distal internal carotid
diameter as the denominator.
CONTRAST:  5.8mL GADAVIST GADOBUTROL 1 MMOL/ML IV SOLN

[Series 5: DWI · axial · 3.0mm · 0.88mm/px · z∈[-132,+13]mm · 8 of 104 slices shown (1 of 4)]
[im 1/104]
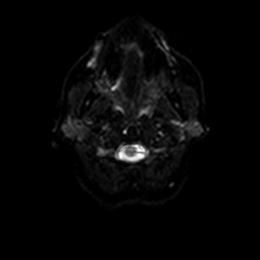
[im 15/104]
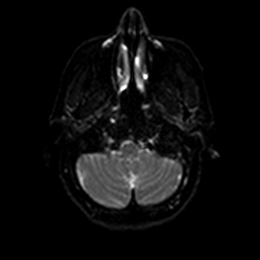
[im 30/104]
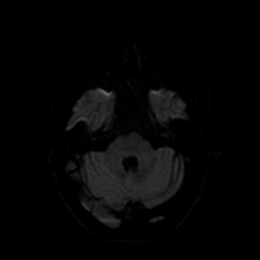
[im 45/104]
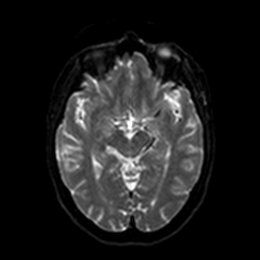
[im 59/104]
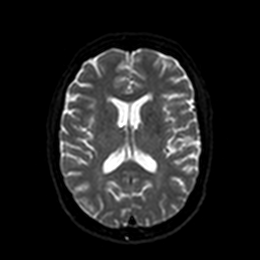
[im 74/104]
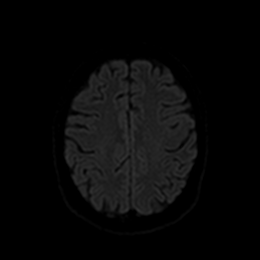
[im 89/104]
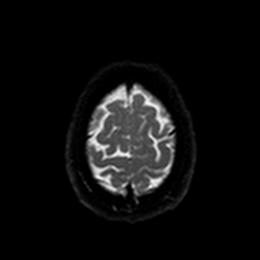
[im 104/104]
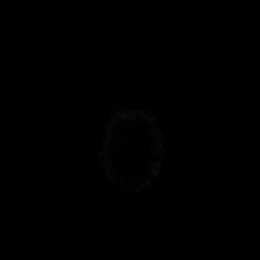

[Series 6: DWI · axial · 3.0mm · 0.88mm/px · z∈[-132,+13]mm · 4 of 52 slices shown (2 of 4)]
[im 1/52]
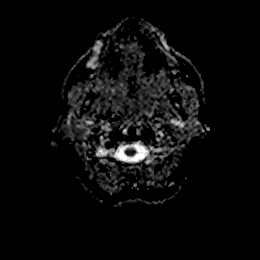
[im 18/52]
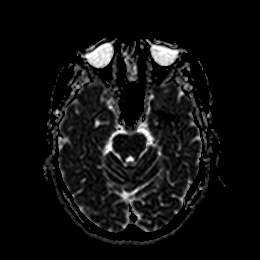
[im 35/52]
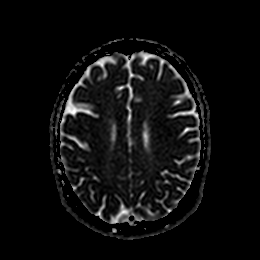
[im 52/52]
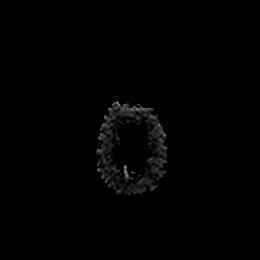

[Series 7: DWI · coronal · 4.0mm · 0.88mm/px · 5 of 70 slices shown (3 of 4)]
[im 1/70]
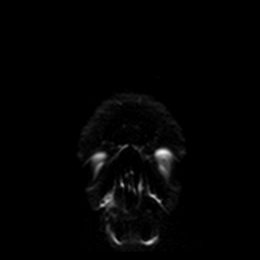
[im 18/70]
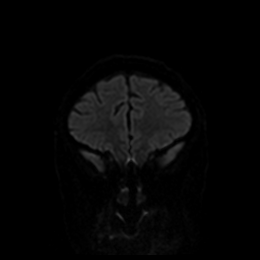
[im 35/70]
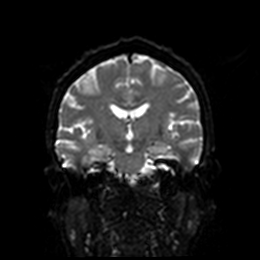
[im 52/70]
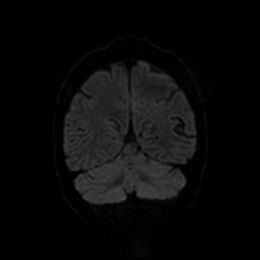
[im 70/70]
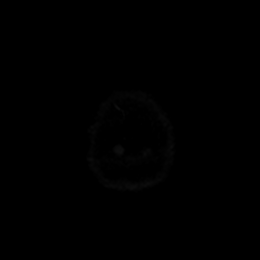

[Series 8: DWI · coronal · 4.0mm · 0.88mm/px · 3 of 35 slices shown (4 of 4)]
[im 1/35]
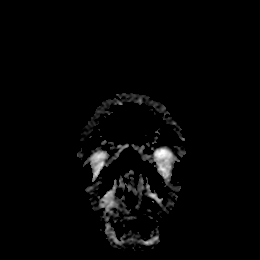
[im 18/35]
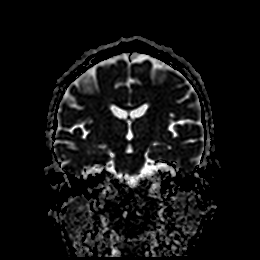
[im 35/35]
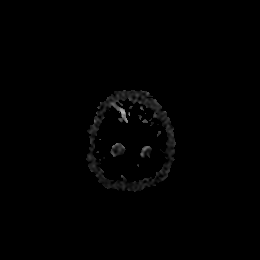

[Series 9: T1 · sagittal · 5.0mm · 0.75mm/px · 2 of 23 slices shown]
[im 1/23]
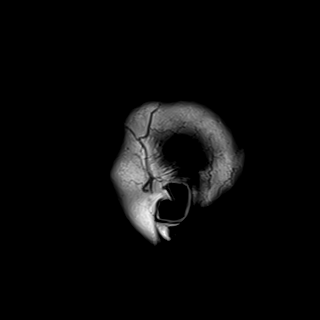
[im 23/23]
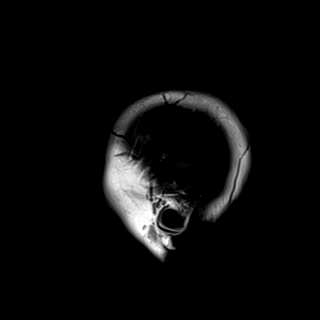

[Series 10: T2 · axial · 5.0mm · 0.72mm/px · z∈[-137,+11]mm · 2 of 27 slices shown (1 of 2)]
[im 1/27]
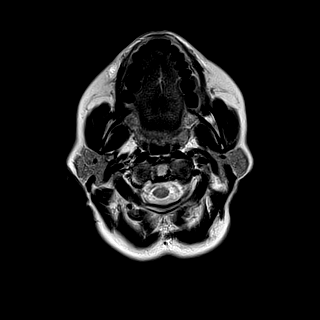
[im 27/27]
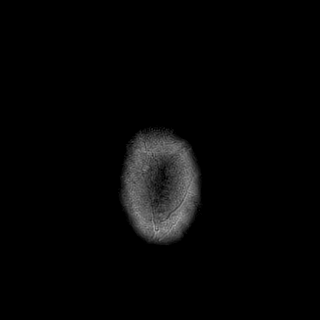

[Series 11: FLAIR · axial · 5.0mm · 0.45mm/px · z∈[-138,+10]mm · 2 of 27 slices shown]
[im 1/27]
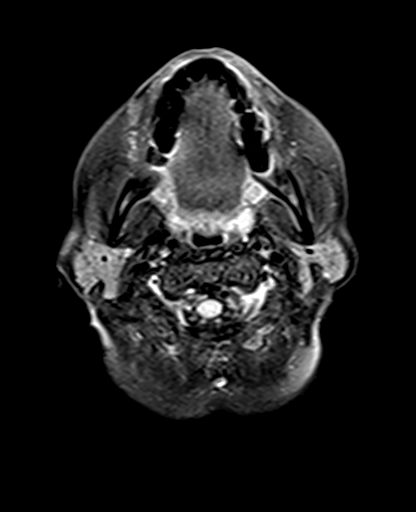
[im 27/27]
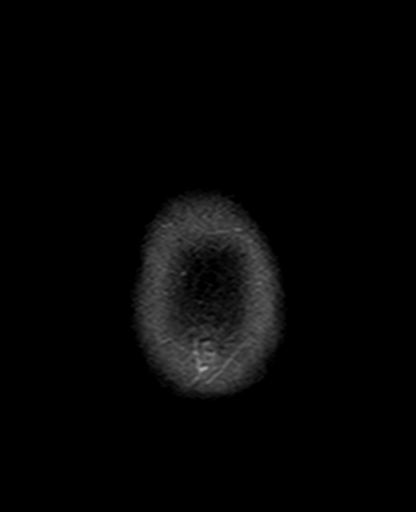

[Series 12: mag_images · axial · 3.0mm · 0.90mm/px · z∈[-139,+17]mm · 4 of 56 slices shown]
[im 1/56]
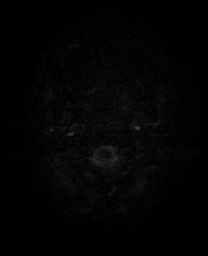
[im 19/56]
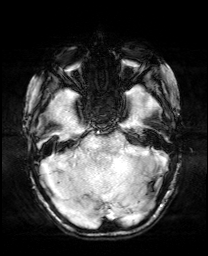
[im 37/56]
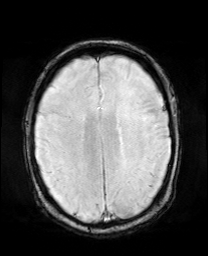
[im 56/56]
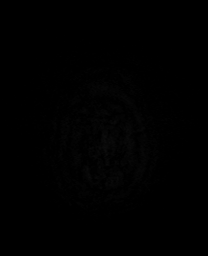

[Series 13: pha_images · axial · 3.0mm · 0.90mm/px · z∈[-139,+17]mm · 4 of 56 slices shown]
[im 1/56]
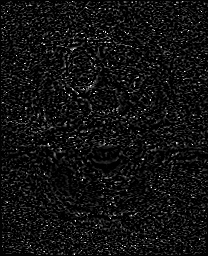
[im 19/56]
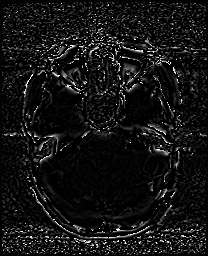
[im 37/56]
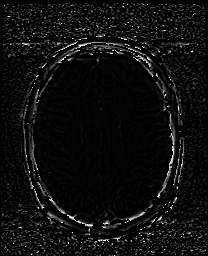
[im 56/56]
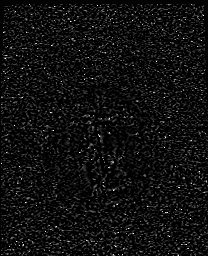

[Series 14: swi_images · axial · 3.0mm · 0.90mm/px · z∈[-139,+17]mm · 4 of 56 slices shown]
[im 1/56]
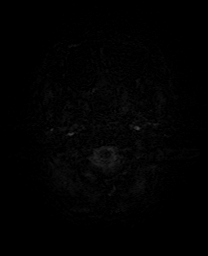
[im 19/56]
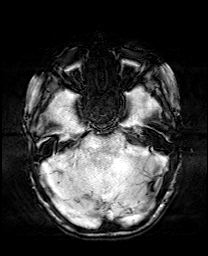
[im 37/56]
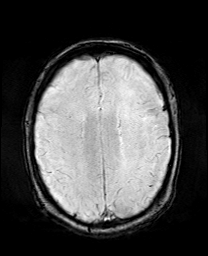
[im 56/56]
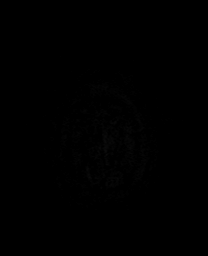

[Series 15: mip_images(sw) · axial · 24.0mm · 0.90mm/px · z∈[-129,-38]mm · 3 of 49 slices shown]
[im 1/49]
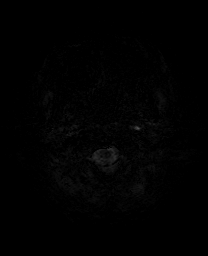
[im 17/49]
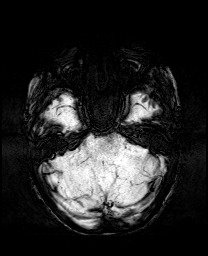
[im 33/49]
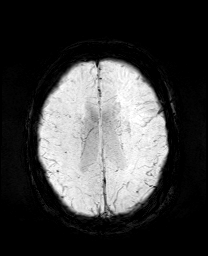

[Series 17: T2 · coronal · 5.0mm · 0.34mm/px · 2 of 29 slices shown (2 of 2)]
[im 1/29]
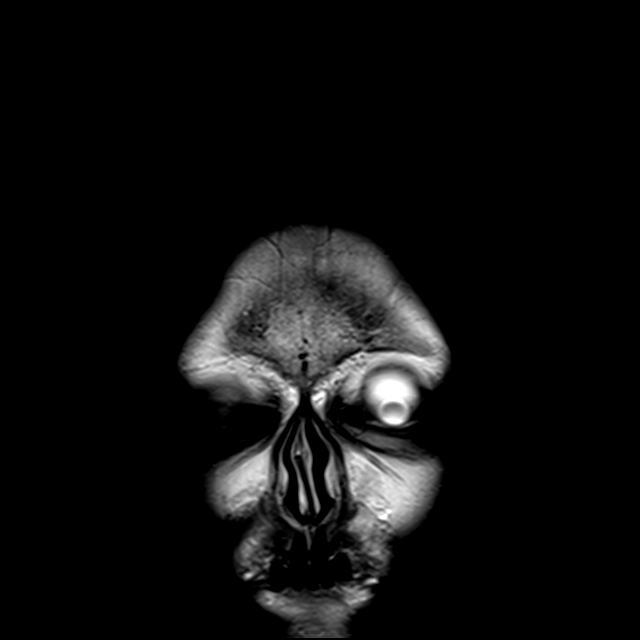
[im 29/29]
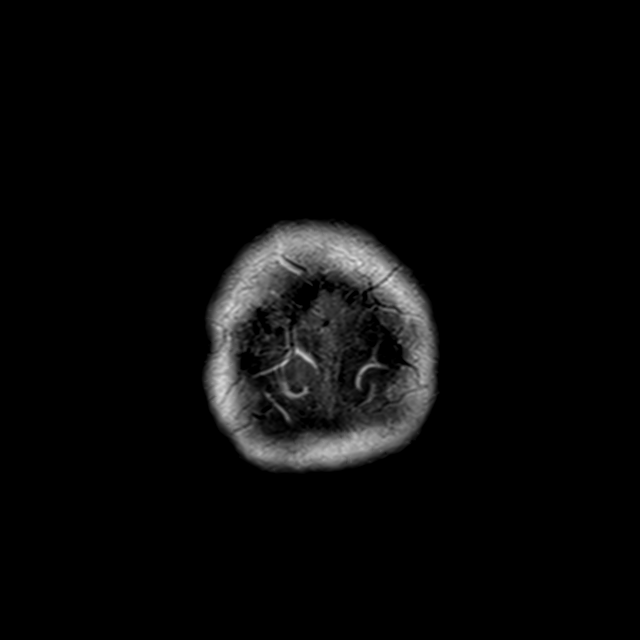

[43 of 48 positions shown; findings below may reference images not displayed]

FINDINGS: MRI HEAD FINDINGS

Brain: Cerebral volume within normal limits for patient age. Mild
patchy T2/FLAIR hyperintensity seen within the periventricular white
matter and pons, most likely related chronic small vessel ischemic
disease, mild in nature. Probable small remote lacunar infarct noted
at the left midbrain (series 16, image 23).

6 mm focus of restricted diffusion seen involving the cortical gray
matter of the right occipital lobe, consistent with an acute
ischemic infarct (series 5, image 73), right PCA distribution. No
associated hemorrhage or mass effect. No other diffusion abnormality
to suggest acute or subacute ischemia. Gray-white matter
differentiation otherwise maintained. No encephalomalacia to suggest
chronic cortical infarction elsewhere within the brain. No foci of
susceptibility artifact to suggest acute or chronic intracranial
hemorrhage.

No mass lesion, midline shift or mass effect. No hydrocephalus. No
extra-axial fluid collection. Major dural sinuses are grossly
patent.

Pituitary gland and suprasellar region are normal. Midline
structures intact and normal.

Vascular: Major intracranial vascular flow voids well maintained and
normal in appearance.

Skull and upper cervical spine: Craniocervical junction normal.
Visualized upper cervical spine within normal limits. Bone marrow
signal intensity normal. No scalp soft tissue abnormality.

Sinuses/Orbits: Globes and orbital soft tissues within normal
limits.

Minimal mucosal thickening noted within the ethmoidal air cells.
Paranasal sinuses are otherwise clear. No mastoid effusion. Inner
ear structures grossly normal.

Other: None.

MRA HEAD FINDINGS

ANTERIOR CIRCULATION:

Visualized distal cervical segments of the internal carotid arteries
are patent with symmetric antegrade flow. Petrous, cavernous, and
supraclinoid segments widely patent without stenosis or other
abnormality. A1 segments patent bilaterally. Normal anterior
communicating artery complex. Anterior cerebral arteries patent to
their distal aspects without stenosis. No M1 stenosis or occlusion.
Normal MCA bifurcations. Distal MCA branches well perfused and
symmetric.

POSTERIOR CIRCULATION:

Dominant left V4 segment widely patent without stenosis. Patent left
PICA origin. Right V4 segment diffusely hypoplastic but patent as
well. Right PICA not seen. Basilar patent to its distal aspect
without stenosis. Superior cerebral arteries patent bilaterally.
Both PCAs primarily supplied via the basilar. Left PCA widely patent
and well perfused to its distal aspect. There is apparent occlusion
of the proximal right P2 segment (series [FM], image 12). No
significant flow within the right PCA distribution distally evident
by MRA.

No intracranial aneurysm.

MRA NECK FINDINGS

AORTIC ARCH: Visualized aortic arch of normal caliber with normal 3
vessel morphology. No hemodynamically significant stenosis seen
about the origin of the great vessels.

RIGHT CAROTID SYSTEM: Right common and internal carotid arteries
widely patent without stenosis, evidence for dissection, or
occlusion. No significant atheromatous irregularity or narrowing
about the right carotid bifurcation.

LEFT CAROTID SYSTEM: Left common and internal carotid arteries
widely patent without stenosis, evidence for dissection or
occlusion. No significant atheromatous irregularity or narrowing
about the left carotid bifurcation.

VERTEBRAL ARTERIES: Both vertebral arteries arise from the
subclavian arteries. Strongly dominant left vertebral artery with a
diffusely hypoplastic right vertebral artery. Vertebral arteries
widely patent within the neck without stenosis, evidence for
dissection, or occlusion.
IMPRESSION: MRI HEAD IMPRESSION:

1. 6 mm acute ischemic nonhemorrhagic cortical infarct involving the
right occipital lobe, right PCA distribution.
2. Underlying mild chronic microvascular ischemic disease for age.

MRA HEAD IMPRESSION:

1. Proximal right P2 occlusion, consistent with the acute right PCA
territory infarct. There may be an underlying chronic stenosis at
this location given the relative small size of the infarct.
2. Otherwise normal intracranial MRA. No other large vessel
occlusion, hemodynamically significant stenosis, or other acute
vascular abnormality.

MRA NECK IMPRESSION:

Normal MRA of the neck.

## 2020-04-17 IMAGING — MR MR MRA HEAD W/O CM
1 series · 16 of 48 positions shown · IV contrast (gadavist)
Comparison: Prior head CT from earlier the same day.

CLINICAL DATA: Initial evaluation for acute neuro deficit, acute
stroke suspected. Transient numbness and vibratory sensation
involving the left-sided body. Headache.

EXAM:
MRI HEAD WITHOUT CONTRAST
MRA HEAD WITHOUT CONTRAST
MRA NECK WITHOUT AND WITH CONTRAST
TECHNIQUE: Multiplanar, multiecho pulse sequences of the brain and surrounding
structures were obtained without intravenous contrast. Angiographic
images of the Circle of Willis were obtained using MRA technique
without intravenous contrast. Angiographic images of the neck were
obtained using MRA technique without and with intravenous contrast.
Carotid stenosis measurements (when applicable) are obtained
utilizing NASCET criteria, using the distal internal carotid
diameter as the denominator.
CONTRAST:  5.8mL GADAVIST GADOBUTROL 1 MMOL/ML IV SOLN

[Series 5: 3d cow · axial · 0.5mm · 0.41mm/px · z∈[-121,-44]mm · 16 of 172 slices shown]
[im 1/172]
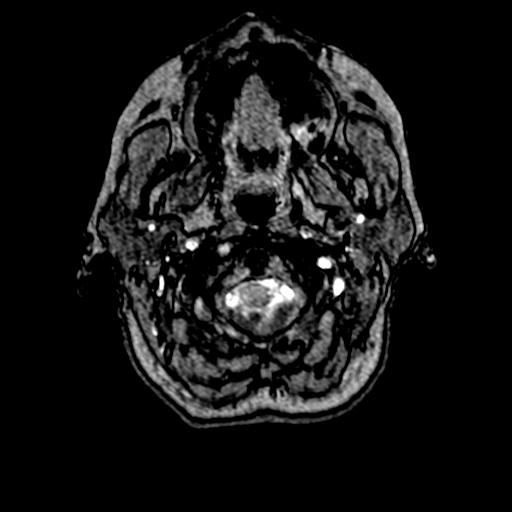
[im 4/172]
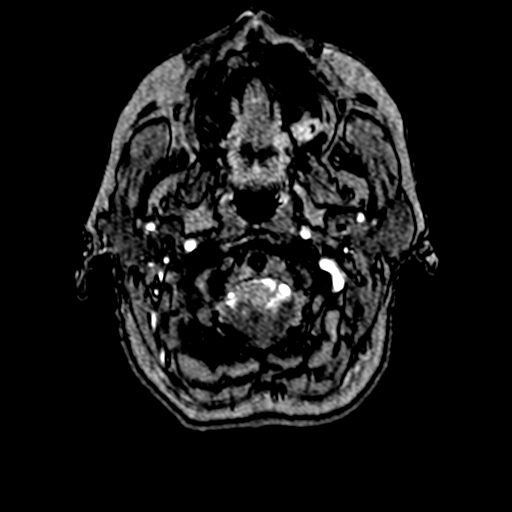
[im 8/172]
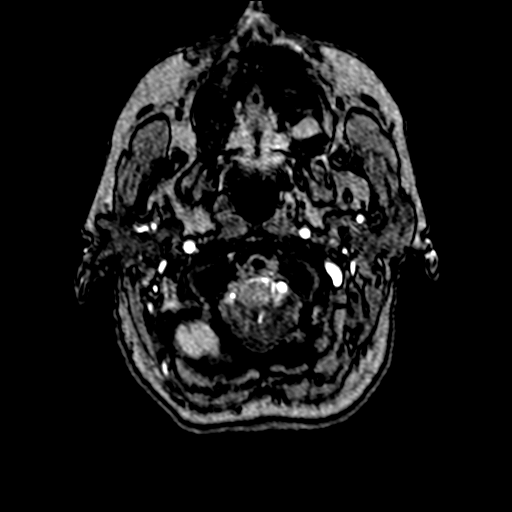
[im 11/172]
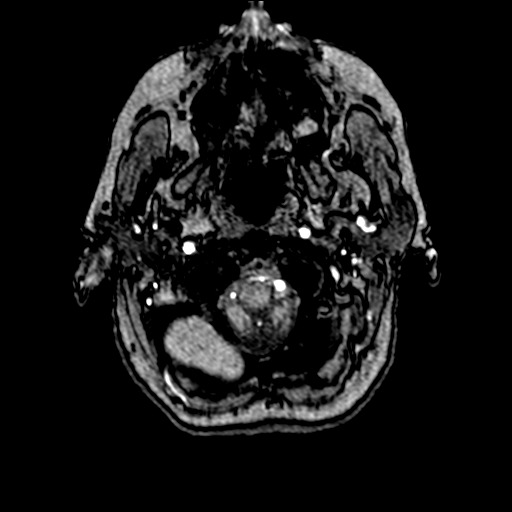
[im 15/172]
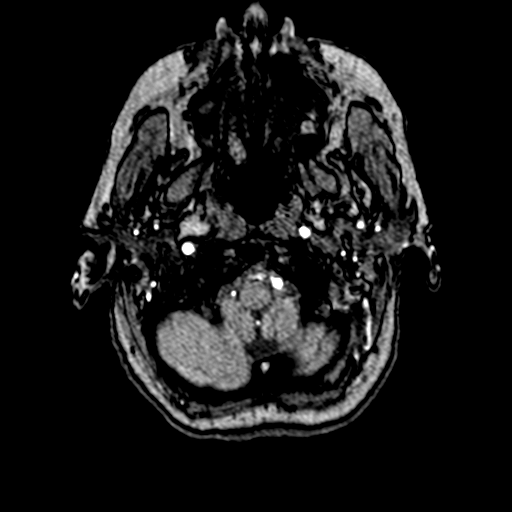
[im 19/172]
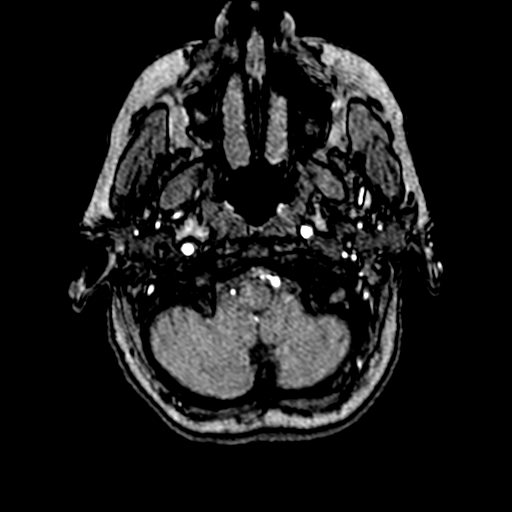
[im 30/172]
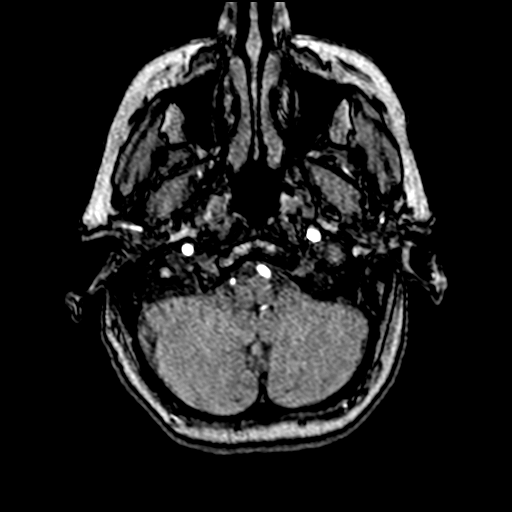
[im 33/172]
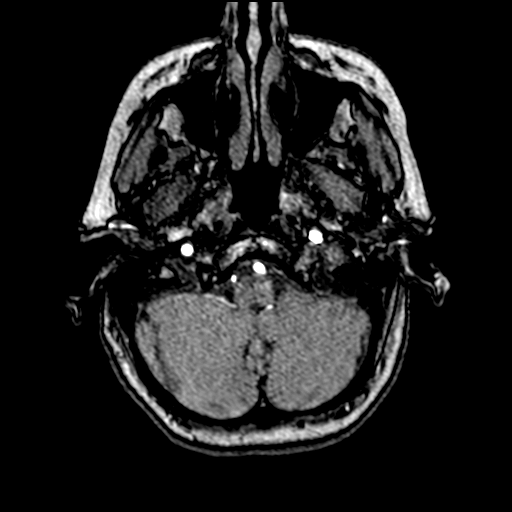
[im 55/172]
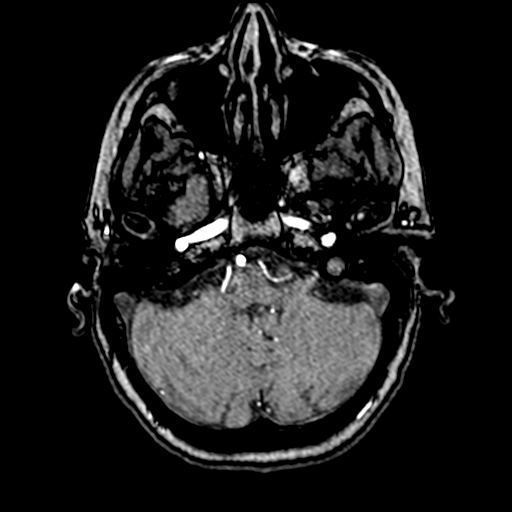
[im 77/172]
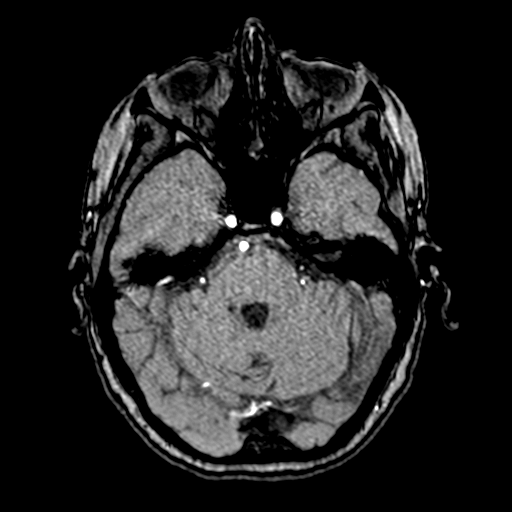
[im 88/172]
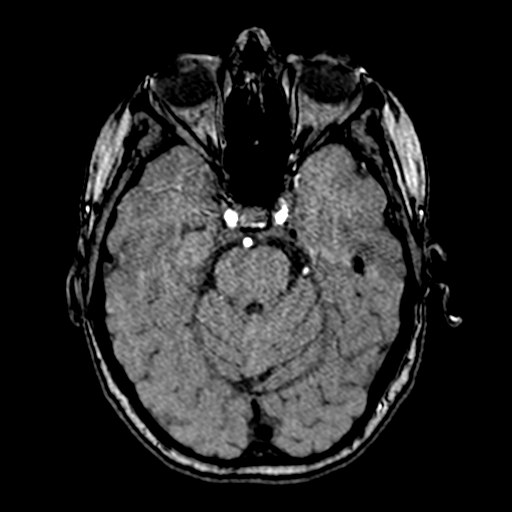
[im 99/172]
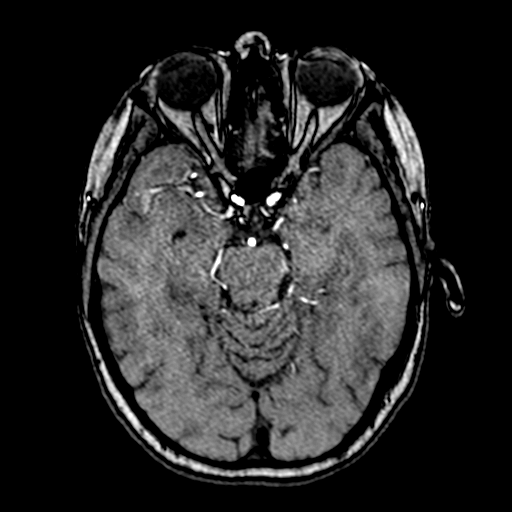
[im 121/172]
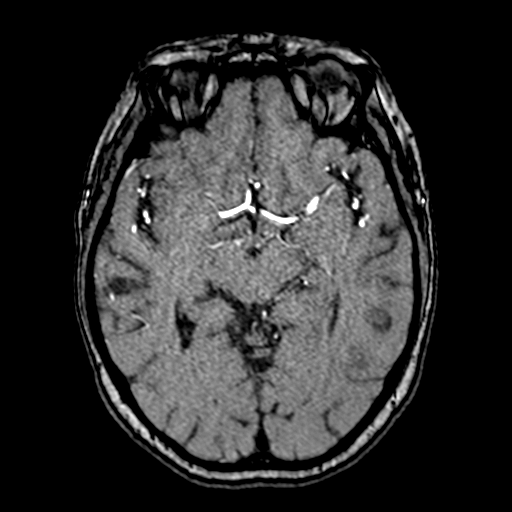
[im 142/172]
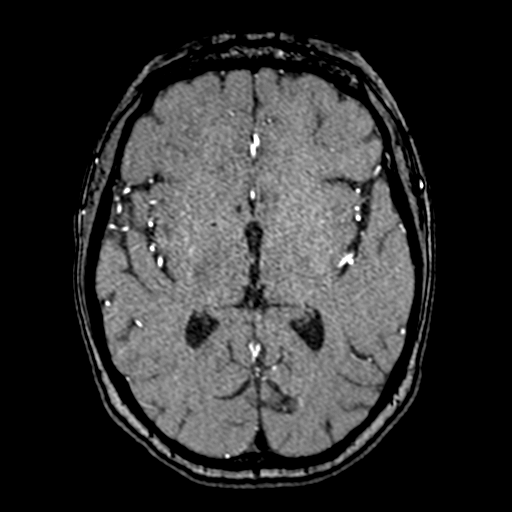
[im 146/172]
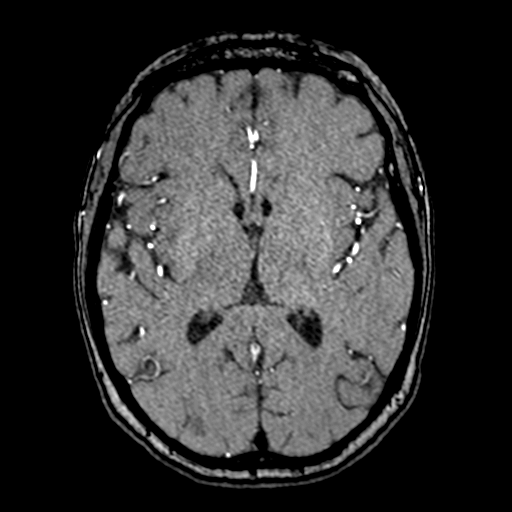
[im 164/172]
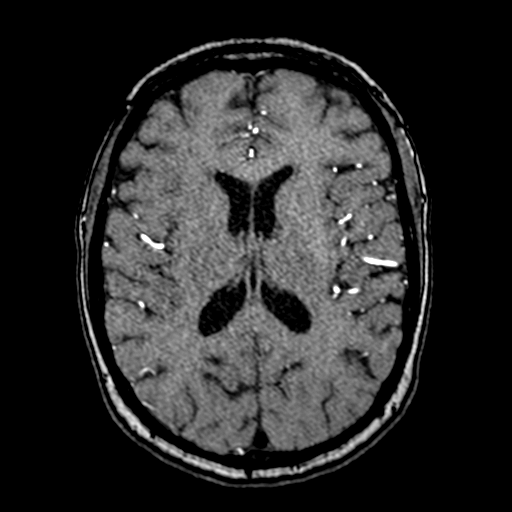

[16 of 48 positions shown; findings below may reference images not displayed]

FINDINGS: MRI HEAD FINDINGS

Brain: Cerebral volume within normal limits for patient age. Mild
patchy T2/FLAIR hyperintensity seen within the periventricular white
matter and pons, most likely related chronic small vessel ischemic
disease, mild in nature. Probable small remote lacunar infarct noted
at the left midbrain (series 16, image 23).

6 mm focus of restricted diffusion seen involving the cortical gray
matter of the right occipital lobe, consistent with an acute
ischemic infarct (series 5, image 73), right PCA distribution. No
associated hemorrhage or mass effect. No other diffusion abnormality
to suggest acute or subacute ischemia. Gray-white matter
differentiation otherwise maintained. No encephalomalacia to suggest
chronic cortical infarction elsewhere within the brain. No foci of
susceptibility artifact to suggest acute or chronic intracranial
hemorrhage.

No mass lesion, midline shift or mass effect. No hydrocephalus. No
extra-axial fluid collection. Major dural sinuses are grossly
patent.

Pituitary gland and suprasellar region are normal. Midline
structures intact and normal.

Vascular: Major intracranial vascular flow voids well maintained and
normal in appearance.

Skull and upper cervical spine: Craniocervical junction normal.
Visualized upper cervical spine within normal limits. Bone marrow
signal intensity normal. No scalp soft tissue abnormality.

Sinuses/Orbits: Globes and orbital soft tissues within normal
limits.

Minimal mucosal thickening noted within the ethmoidal air cells.
Paranasal sinuses are otherwise clear. No mastoid effusion. Inner
ear structures grossly normal.

Other: None.

MRA HEAD FINDINGS

ANTERIOR CIRCULATION:

Visualized distal cervical segments of the internal carotid arteries
are patent with symmetric antegrade flow. Petrous, cavernous, and
supraclinoid segments widely patent without stenosis or other
abnormality. A1 segments patent bilaterally. Normal anterior
communicating artery complex. Anterior cerebral arteries patent to
their distal aspects without stenosis. No M1 stenosis or occlusion.
Normal MCA bifurcations. Distal MCA branches well perfused and
symmetric.

POSTERIOR CIRCULATION:

Dominant left V4 segment widely patent without stenosis. Patent left
PICA origin. Right V4 segment diffusely hypoplastic but patent as
well. Right PICA not seen. Basilar patent to its distal aspect
without stenosis. Superior cerebral arteries patent bilaterally.
Both PCAs primarily supplied via the basilar. Left PCA widely patent
and well perfused to its distal aspect. There is apparent occlusion
of the proximal right P2 segment (series [FM], image 12). No
significant flow within the right PCA distribution distally evident
by MRA.

No intracranial aneurysm.

MRA NECK FINDINGS

AORTIC ARCH: Visualized aortic arch of normal caliber with normal 3
vessel morphology. No hemodynamically significant stenosis seen
about the origin of the great vessels.

RIGHT CAROTID SYSTEM: Right common and internal carotid arteries
widely patent without stenosis, evidence for dissection, or
occlusion. No significant atheromatous irregularity or narrowing
about the right carotid bifurcation.

LEFT CAROTID SYSTEM: Left common and internal carotid arteries
widely patent without stenosis, evidence for dissection or
occlusion. No significant atheromatous irregularity or narrowing
about the left carotid bifurcation.

VERTEBRAL ARTERIES: Both vertebral arteries arise from the
subclavian arteries. Strongly dominant left vertebral artery with a
diffusely hypoplastic right vertebral artery. Vertebral arteries
widely patent within the neck without stenosis, evidence for
dissection, or occlusion.
IMPRESSION: MRI HEAD IMPRESSION:

1. 6 mm acute ischemic nonhemorrhagic cortical infarct involving the
right occipital lobe, right PCA distribution.
2. Underlying mild chronic microvascular ischemic disease for age.

MRA HEAD IMPRESSION:

1. Proximal right P2 occlusion, consistent with the acute right PCA
territory infarct. There may be an underlying chronic stenosis at
this location given the relative small size of the infarct.
2. Otherwise normal intracranial MRA. No other large vessel
occlusion, hemodynamically significant stenosis, or other acute
vascular abnormality.

MRA NECK IMPRESSION:

Normal MRA of the neck.

## 2020-04-17 IMAGING — CT CT HEAD W/O CM
4 series · 17 of 47 positions shown, 19 images · non-contrast
Comparison: None

CLINICAL DATA: Numbness and vibrating sensation over LEFT side of
body, 2 minutes episode approximately 45 minutes prior to
presentation, symptoms resolved, has dull headache and LEFT
peripheral vision disturbance; no history of stroke

EXAM:
CT HEAD WITHOUT CONTRAST
TECHNIQUE: Contiguous axial images were obtained from the base of the skull
through the vertex without intravenous contrast. Sagittal and
coronal MPR images reconstructed from axial data set.

[Series 3: head without · axial · non-contrast · 0.44mm/px · z∈[+848,+968]mm · 7 of 33 slices shown, 9 images]
[im 5/33  brain]
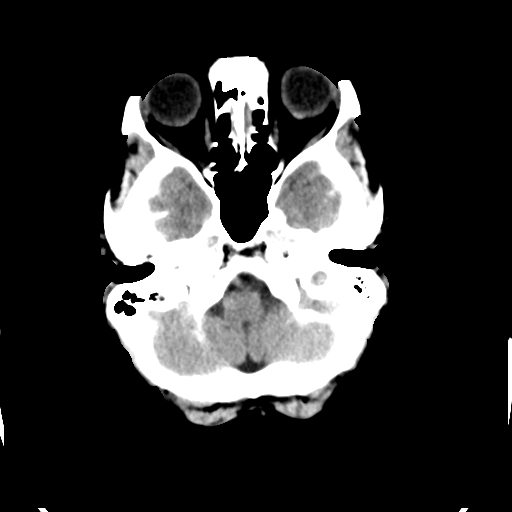
[im 5/33  bone]
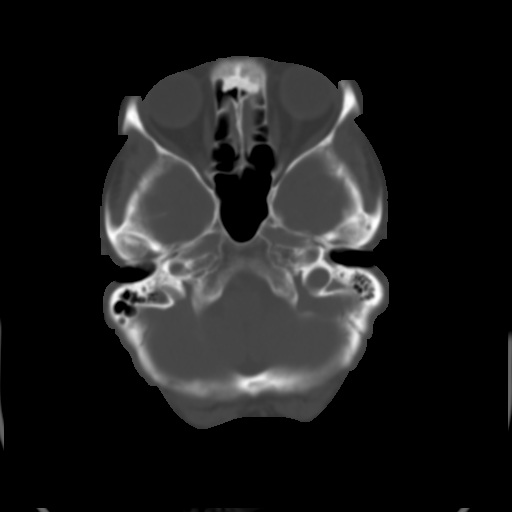
[im 9/33  brain]
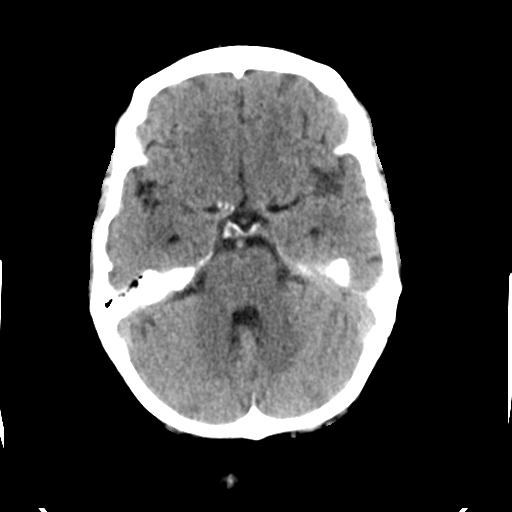
[im 13/33  brain]
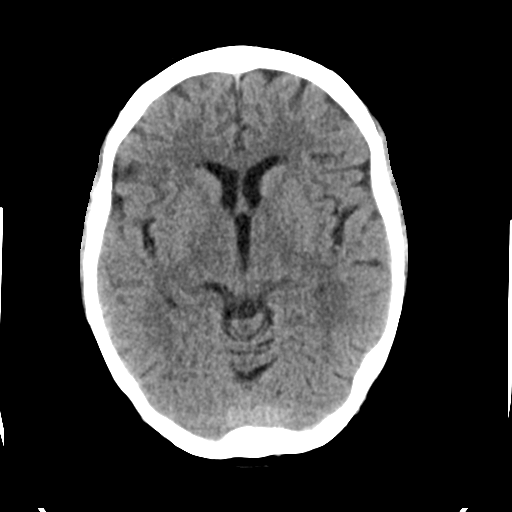
[im 17/33  brain]
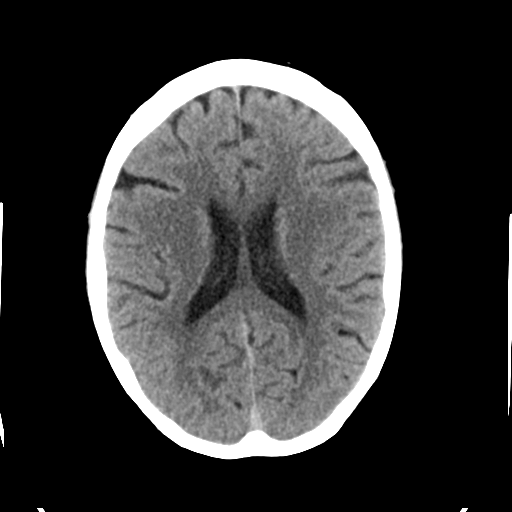
[im 21/33  brain]
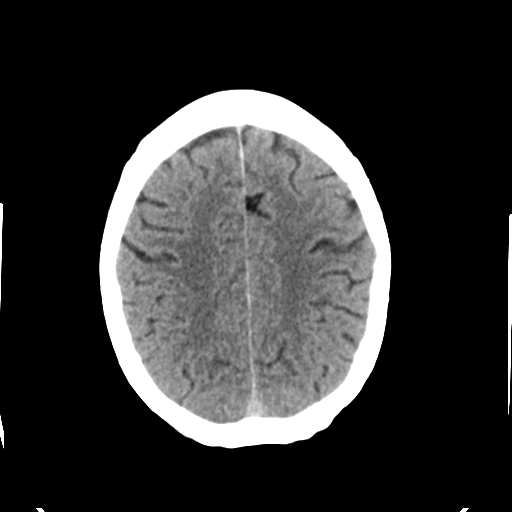
[im 21/33  bone]
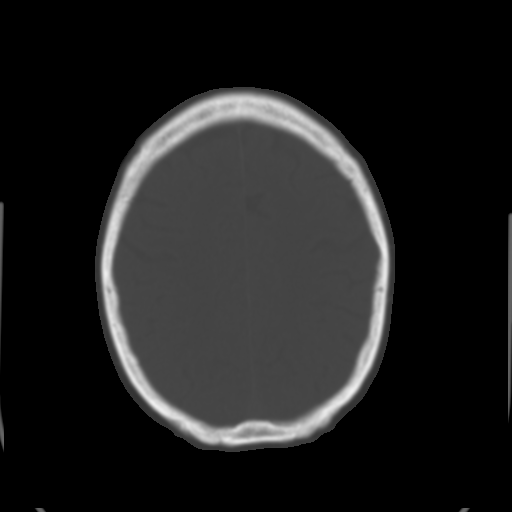
[im 25/33  brain]
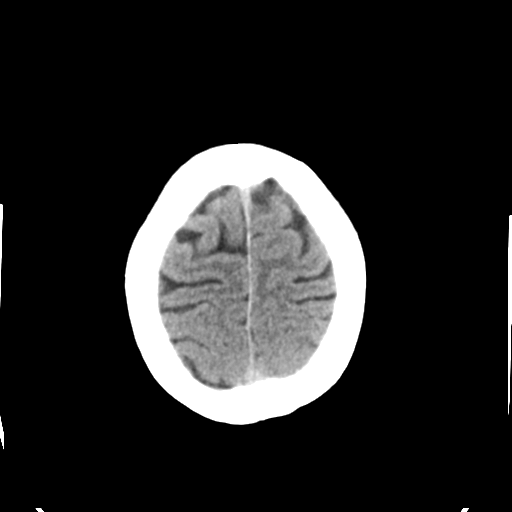
[im 29/33  brain]
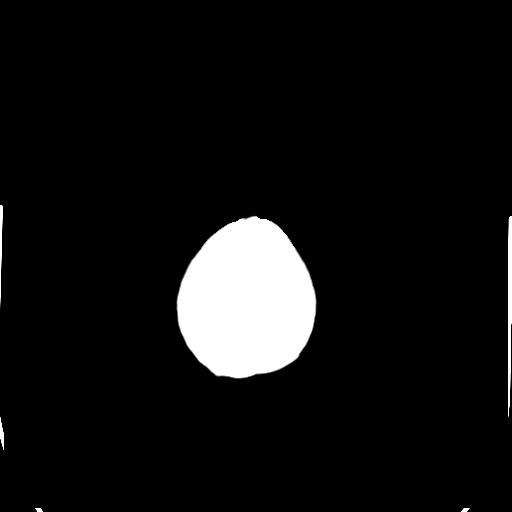

[Series 4: head bone · axial · 0.44mm/px · z∈[+844,+900]mm · 4 of 82 slices shown]
[im 9/82  bone]
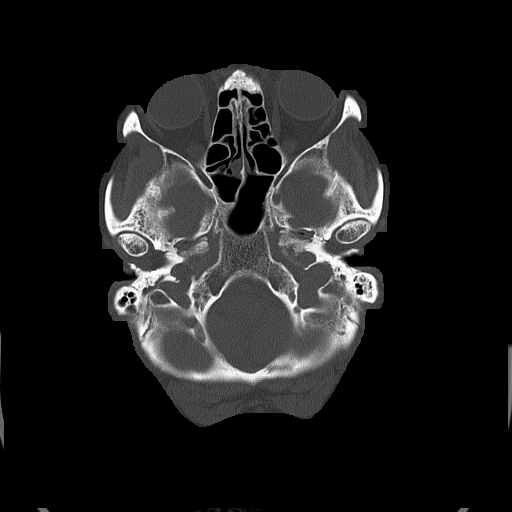
[im 17/82  bone]
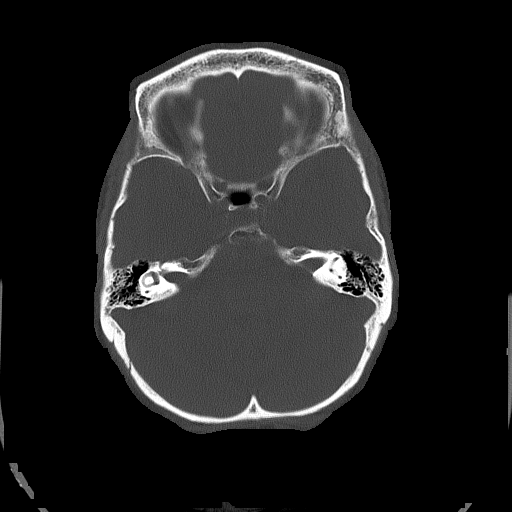
[im 25/82  bone]
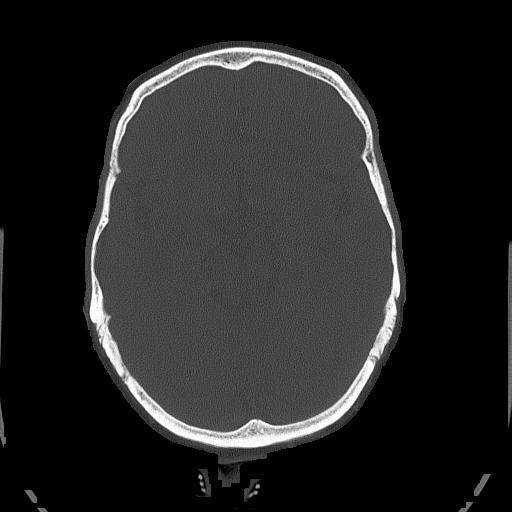
[im 37/82  bone]
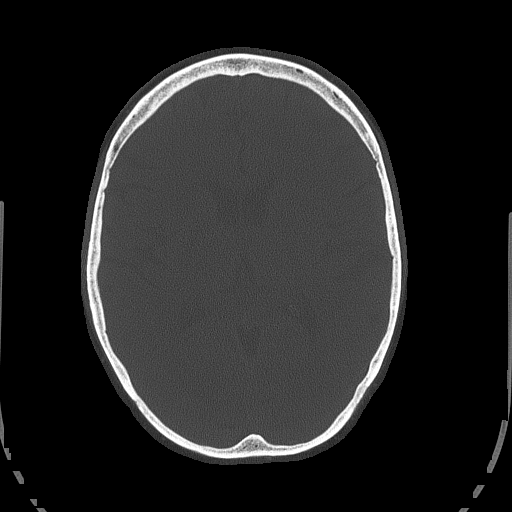

[Series 5: head without cor · coronal · non-contrast · 0.35mm/px · 3 of 69 slices shown]
[im 23/69  brain]
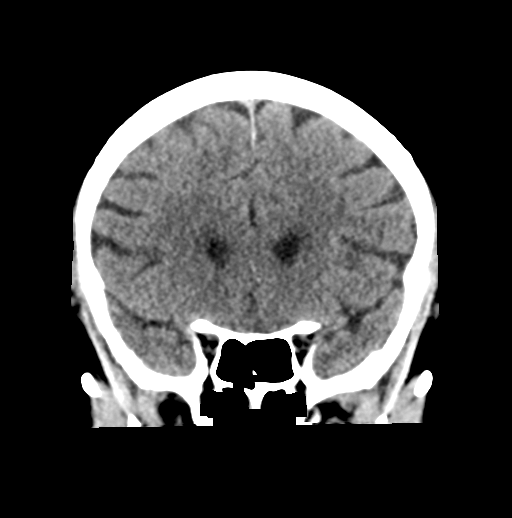
[im 31/69  brain]
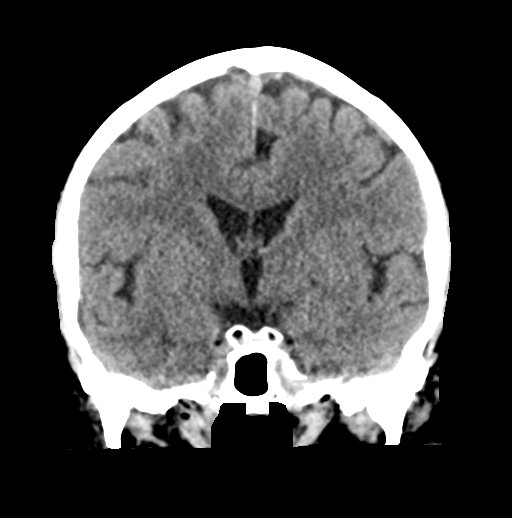
[im 38/69  brain]
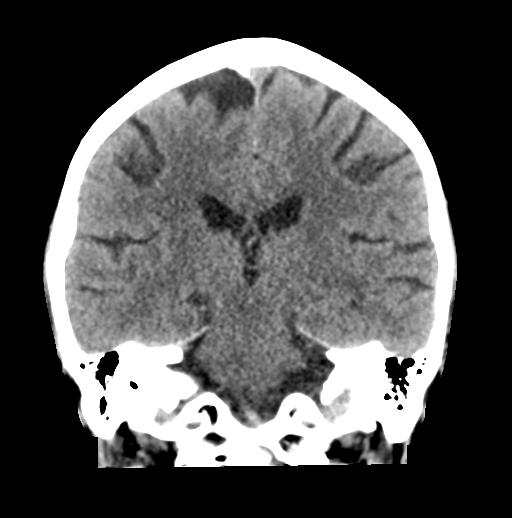

[Series 6: head without sag · sagittal · non-contrast · 0.33mm/px · 3 of 60 slices shown]
[im 20/60  brain]
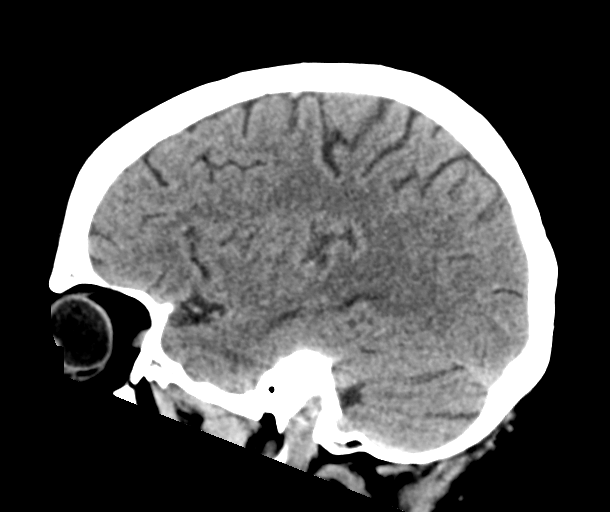
[im 30/60  brain]
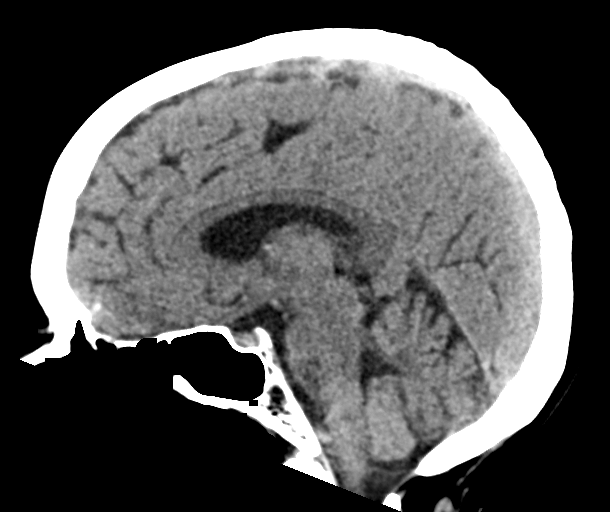
[im 40/60  brain]
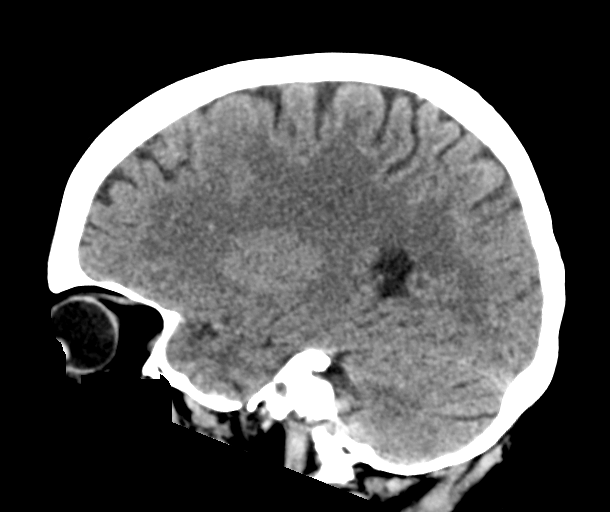

[17 of 47 positions shown; findings below may reference images not displayed]

FINDINGS: Brain: Normal ventricular morphology. No midline shift or mass
effect. Normal appearance of brain parenchyma. No intracranial
hemorrhage, mass lesion or evidence of acute infarction. No
extra-axial fluid collections.

Vascular: No hyperdense vessels

Skull: Intact

Sinuses/Orbits: Clear

Other: N/A
IMPRESSION: Normal exam.

## 2020-04-17 MED ORDER — ACETAMINOPHEN 325 MG PO TABS: 650.0000 mg | ORAL_TABLET | ORAL | Status: DC | PRN

## 2020-04-17 MED ORDER — ASPIRIN 300 MG RE SUPP: 300.0000 mg | Freq: Every day | RECTAL | Status: DC

## 2020-04-17 MED ORDER — ACETAMINOPHEN 650 MG RE SUPP: 650.0000 mg | RECTAL | Status: DC | PRN

## 2020-04-17 MED ORDER — SODIUM CHLORIDE 0.9% FLUSH
3.0000 mL | Freq: Once | INTRAVENOUS | Status: AC
  Administered 2020-04-17: 3 mL via INTRAVENOUS

## 2020-04-17 MED ORDER — ENOXAPARIN SODIUM 40 MG/0.4ML ~~LOC~~ SOLN
40.0000 mg | SUBCUTANEOUS | Status: DC
  Administered 2020-04-18 – 2020-04-19 (×2): 40 mg via SUBCUTANEOUS
  Filled 2020-04-17 (×2): qty 0.4

## 2020-04-17 MED ORDER — ATORVASTATIN CALCIUM 40 MG PO TABS
40.0000 mg | ORAL_TABLET | Freq: Every day | ORAL | Status: DC
  Filled 2020-04-17: qty 4

## 2020-04-17 MED ORDER — ASPIRIN EC 325 MG PO TBEC
325.0000 mg | DELAYED_RELEASE_TABLET | Freq: Once | ORAL | Status: AC
  Administered 2020-04-17: 325 mg via ORAL
  Filled 2020-04-17: qty 1

## 2020-04-17 MED ORDER — ACETAMINOPHEN 160 MG/5ML PO SOLN: 650.0000 mg | ORAL | Status: DC | PRN

## 2020-04-17 MED ORDER — STROKE: EARLY STAGES OF RECOVERY BOOK
Freq: Once | Status: AC
  Filled 2020-04-17: qty 1

## 2020-04-17 MED ORDER — ASPIRIN 325 MG PO TABS
325.0000 mg | ORAL_TABLET | Freq: Every day | ORAL | Status: DC
  Administered 2020-04-18 – 2020-04-19 (×2): 325 mg via ORAL
  Filled 2020-04-17 (×2): qty 1

## 2020-04-17 MED ORDER — GADOBUTROL 1 MMOL/ML IV SOLN
5.8000 mL | Freq: Once | INTRAVENOUS | Status: AC | PRN
  Administered 2020-04-17: 5.8 mL via INTRAVENOUS

## 2020-04-17 NOTE — ED Provider Notes (Signed)
[facility] EMERGENCY DEPARTMENT Provider Note   CSN: [id number] Arrival date & time: 04/17/20  1643     History Chief Complaint  Patient presents with  . Numbness    Vicki Stevens is a 64 y.o. female. She is here with a complaint of left-sided vibration feeling and numbness over her left arm trunk and leg that started a few hours ago. It lasted about 2 minutes. Was associated with a little bit of tingling in her left face. It was followed by some head pressure/headache. She said she has actually been getting on and off headaches about a week and has had 2 weeks some some peripheral vision distortion that happens intermittently.  Currently all symptoms have resolved.  No prior history of same.  No recent illness or head injuries. The history is provided by the patient.  Cerebrovascular Accident This is a new problem. The current episode started 3 to 5 hours ago. The problem has been resolved. Associated symptoms include headaches. Pertinent negatives include no chest pain, no abdominal pain and no shortness of breath. Nothing aggravates the symptoms. Nothing relieves the symptoms. She has tried nothing for the symptoms. The treatment provided significant relief.       Past Medical History:  Diagnosis Date  . H/O hematuria remote   dr Vonita Moss felt to be from running   . Heart murmur    evaluation ? mvp when younger  . Hx of varicella   . LOW BACK PAIN, ACUTE 03/05/2010    Patient Active Problem List   Diagnosis Date Noted  . Chest tightness or pressure 06/12/2014  . Influenza vaccination declined 06/12/2014  . Insomnia 07/22/2013  . Visit for preventive health examination 07/22/2013  . Perianal dermatitis 08/04/2012  . Weight gain 08/04/2012  . Counseling on health promotion and disease prevention 08/04/2012  . Elevated blood pressure reading 08/04/2012    No past surgical history on file.   OB History   No obstetric history on file.     Family  History  Problem Relation Age of Onset  . Lung cancer Father        41  . Other Mother        MVA 42     Social History   Tobacco Use  . Smoking status: Never Smoker  . Smokeless tobacco: Never Used  Substance Use Topics  . Alcohol use: Yes    Alcohol/week: 0.0 standard drinks  . Drug use: Not on file    Home Medications Prior to Admission medications   Medication Sig Start Date End Date Taking? Authorizing Provider  Cyanocobalamin 2500 MCG TABS Take by mouth.    [provider]  lisinopril (PRINIVIL,ZESTRIL) 10 MG tablet Take 1 tablet (10 mg total) by mouth daily. 06/30/16   Panosh, [name], MD  Multiple Vitamins-Minerals (ADULT GUMMY PO) Take by mouth.    [provider]  Pseudoephedrine-Acetaminophen (SM NON-ASPRIN SINUS PO) Take by mouth.    [provider]    Allergies    Patient has no known allergies.  Review of Systems   Review of Systems  Constitutional: Negative for fever.  HENT: Negative for sore throat.   Eyes: Positive for visual disturbance.  Respiratory: Negative for shortness of breath.   Cardiovascular: Negative for chest pain.  Gastrointestinal: Negative for abdominal pain.  Genitourinary: Negative for dysuria.  Musculoskeletal: Negative for neck pain.  Skin: Negative for rash.  Neurological: Positive for numbness and headaches.    Physical Exam Updated  Vital Signs BP (!) 215/116 (BP Location: Right Arm)   Pulse 73   Temp 98.2 F (36.8 C) (Oral)   Resp 14   SpO2 100%   Physical Exam Vitals and nursing note reviewed.  Constitutional:      General: She is not in acute distress.    Appearance: Normal appearance. She is well-developed.  HENT:     Head: Normocephalic and atraumatic.  Eyes:     Conjunctiva/sclera: Conjunctivae normal.  Cardiovascular:     Rate and Rhythm: Normal rate and regular rhythm.     Heart sounds: No murmur heard.   Pulmonary:     Effort: Pulmonary effort is normal. No respiratory  distress.     Breath sounds: Normal breath sounds.  Abdominal:     Palpations: Abdomen is soft.     Tenderness: There is no abdominal tenderness.  Musculoskeletal:        General: No deformity or signs of injury. Normal range of motion.     Cervical back: Neck supple.  Skin:    General: Skin is warm and dry.     Capillary Refill: Capillary refill takes less than 2 seconds.  Neurological:     General: No focal deficit present.     Mental Status: She is alert and oriented to person, place, and time.     Cranial Nerves: No cranial nerve deficit.     Sensory: No sensory deficit.     Motor: No weakness.     Coordination: Coordination normal.     Gait: Gait normal.     ED Results / Procedures / Treatments   Labs (all labs ordered are listed, but only abnormal results are displayed) Labs Reviewed  COMPREHENSIVE METABOLIC PANEL - Abnormal; Notable for the following components:      Result Value   Glucose, Bld 113 (*)    Total Protein 6.2 (*)    All other components within normal limits  LIPID PANEL - Abnormal; Notable for the following components:   Cholesterol 226 (*)    LDL Cholesterol 133 (*)    All other components within normal limits  I-STAT CHEM 8, ED - Abnormal; Notable for the following components:   Glucose, Bld 110 (*)    All other components within normal limits  RESP PANEL BY RT-PCR (FLU A&B, COVID) ARPGX2  PROTIME-INR  APTT  CBC  DIFFERENTIAL  HEMOGLOBIN A1C  HIV ANTIBODY (ROUTINE TESTING W REFLEX)  RAPID URINE DRUG SCREEN, HOSP PERFORMED  CBG MONITORING, ED    EKG EKG Interpretation  Date/Time:  Tuesday April 17 2020 16:57:36 EST Ventricular Rate:  64 PR Interval:  206 QRS Duration: 86 QT Interval:  404 QTC Calculation: 416 R Axis:   16 Text Interpretation: Normal sinus rhythm Moderate voltage criteria for LVH, may be normal variant ( R in aVL , Cornell product ) Borderline ECG No old tracing to compare Confirmed by Meridee Score [phone]) on  04/17/2020 6:29:43 PM   Radiology CT HEAD WO CONTRAST  Result Date: 04/17/2020 CLINICAL DATA:  Numbness and vibrating sensation over LEFT side of body, 2 minutes episode approximately 45 minutes prior to presentation, symptoms resolved, has dull headache and LEFT peripheral vision disturbance; no history of stroke EXAM: CT HEAD WITHOUT CONTRAST TECHNIQUE: Contiguous axial images were obtained from the base of the skull through the vertex without intravenous contrast. Sagittal and coronal MPR images reconstructed from axial data set. COMPARISON:  None FINDINGS: Brain: Normal ventricular morphology. No midline shift or mass effect. Normal appearance  of brain parenchyma. No intracranial hemorrhage, mass lesion or evidence of acute infarction. No extra-axial fluid collections. Vascular: No hyperdense vessels Skull: Intact Sinuses/Orbits: Clear Other: N/A IMPRESSION: Normal exam. Electronically Signed   By: Ulyses Southward M.D.   On: 04/17/2020 18:21   MR ANGIO HEAD WO CONTRAST  Result Date: 04/17/2020 CLINICAL DATA:  Initial evaluation for acute neuro deficit, acute stroke suspected. Transient numbness and vibratory sensation involving the left-sided body. Headache. EXAM: MRI HEAD WITHOUT CONTRAST MRA HEAD WITHOUT CONTRAST MRA NECK WITHOUT AND WITH CONTRAST TECHNIQUE: Multiplanar, multiecho pulse sequences of the brain and surrounding structures were obtained without intravenous contrast. Angiographic images of the Circle of Willis were obtained using MRA technique without intravenous contrast. Angiographic images of the neck were obtained using MRA technique without and with intravenous contrast. Carotid stenosis measurements (when applicable) are obtained utilizing NASCET criteria, using the distal internal carotid diameter as the denominator. CONTRAST:  5.73mL GADAVIST GADOBUTROL 1 MMOL/ML IV SOLN COMPARISON:  Prior head CT from earlier the same day. FINDINGS: MRI HEAD FINDINGS Brain: Cerebral volume within  normal limits for patient age. Mild patchy T2/FLAIR hyperintensity seen within the periventricular white matter and pons, most likely related chronic small vessel ischemic disease, mild in nature. Probable small remote lacunar infarct noted at the left midbrain (series 16, image 23). 6 mm focus of restricted diffusion seen involving the cortical gray matter of the right occipital lobe, consistent with an acute ischemic infarct (series 5, image 73), right PCA distribution. No associated hemorrhage or mass effect. No other diffusion abnormality to suggest acute or subacute ischemia. Gray-white matter differentiation otherwise maintained. No encephalomalacia to suggest chronic cortical infarction elsewhere within the brain. No foci of susceptibility artifact to suggest acute or chronic intracranial hemorrhage. No mass lesion, midline shift or mass effect. No hydrocephalus. No extra-axial fluid collection. Major dural sinuses are grossly patent. Pituitary gland and suprasellar region are normal. Midline structures intact and normal. Vascular: Major intracranial vascular flow voids well maintained and normal in appearance. Skull and upper cervical spine: Craniocervical junction normal. Visualized upper cervical spine within normal limits. Bone marrow signal intensity normal. No scalp soft tissue abnormality. Sinuses/Orbits: Globes and orbital soft tissues within normal limits. Minimal mucosal thickening noted within the ethmoidal air cells. Paranasal sinuses are otherwise clear. No mastoid effusion. Inner ear structures grossly normal. Other: None. MRA HEAD FINDINGS ANTERIOR CIRCULATION: Visualized distal cervical segments of the internal carotid arteries are patent with symmetric antegrade flow. Petrous, cavernous, and supraclinoid segments widely patent without stenosis or other abnormality. A1 segments patent bilaterally. Normal anterior communicating artery complex. Anterior cerebral arteries patent to their distal  aspects without stenosis. No M1 stenosis or occlusion. Normal MCA bifurcations. Distal MCA branches well perfused and symmetric. POSTERIOR CIRCULATION: Dominant left V4 segment widely patent without stenosis. Patent left PICA origin. Right V4 segment diffusely hypoplastic but patent as well. Right PICA not seen. Basilar patent to its distal aspect without stenosis. Superior cerebral arteries patent bilaterally. Both PCAs primarily supplied via the basilar. Left PCA widely patent and well perfused to its distal aspect. There is apparent occlusion of the proximal right P2 segment (series 1054, image 12). No significant flow within the right PCA distribution distally evident by MRA. No intracranial aneurysm. MRA NECK FINDINGS AORTIC ARCH: Visualized aortic arch of normal caliber with normal 3 vessel morphology. No hemodynamically significant stenosis seen about the origin of the great vessels. RIGHT CAROTID SYSTEM: Right common and internal carotid arteries widely patent without stenosis, evidence for  dissection, or occlusion. No significant atheromatous irregularity or narrowing about the right carotid bifurcation. LEFT CAROTID SYSTEM: Left common and internal carotid arteries widely patent without stenosis, evidence for dissection or occlusion. No significant atheromatous irregularity or narrowing about the left carotid bifurcation. VERTEBRAL ARTERIES: Both vertebral arteries arise from the subclavian arteries. Strongly dominant left vertebral artery with a diffusely hypoplastic right vertebral artery. Vertebral arteries widely patent within the neck without stenosis, evidence for dissection, or occlusion. IMPRESSION: MRI HEAD IMPRESSION: 1. 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 2. Underlying mild chronic microvascular ischemic disease for age. MRA HEAD IMPRESSION: 1. Proximal right P2 occlusion, consistent with the acute right PCA territory  infarct. There may be an underlying chronic stenosis at this location given the relative small size of the infarct. 2. Otherwise normal intracranial MRA. No other large vessel occlusion, hemodynamically significant stenosis, or other acute vascular abnormality. MRA NECK IMPRESSION: Normal MRA of the neck. Electronically Signed   By: Rise Mu M.D.   On: 04/17/2020 22:37   MR Angiogram Neck W or Wo Contrast  Result Date: 04/17/2020 CLINICAL DATA:  Initial evaluation for acute neuro deficit, acute stroke suspected. Transient numbness and vibratory sensation involving the left-sided body. Headache. EXAM: MRI HEAD WITHOUT CONTRAST MRA HEAD WITHOUT CONTRAST MRA NECK WITHOUT AND WITH CONTRAST TECHNIQUE: Multiplanar, multiecho pulse sequences of the brain and surrounding structures were obtained without intravenous contrast. Angiographic images of the Circle of Willis were obtained using MRA technique without intravenous contrast. Angiographic images of the neck were obtained using MRA technique without and with intravenous contrast. Carotid stenosis measurements (when applicable) are obtained utilizing NASCET criteria, using the distal internal carotid diameter as the denominator. CONTRAST:  5.81mL GADAVIST GADOBUTROL 1 MMOL/ML IV SOLN COMPARISON:  Prior head CT from earlier the same day. FINDINGS: MRI HEAD FINDINGS Brain: Cerebral volume within normal limits for patient age. Mild patchy T2/FLAIR hyperintensity seen within the periventricular white matter and pons, most likely related chronic small vessel ischemic disease, mild in nature. Probable small remote lacunar infarct noted at the left midbrain (series 16, image 23). 6 mm focus of restricted diffusion seen involving the cortical gray matter of the right occipital lobe, consistent with an acute ischemic infarct (series 5, image 73), right PCA distribution. No associated hemorrhage or mass effect. No other diffusion abnormality to suggest acute or  subacute ischemia. Gray-white matter differentiation otherwise maintained. No encephalomalacia to suggest chronic cortical infarction elsewhere within the brain. No foci of susceptibility artifact to suggest acute or chronic intracranial hemorrhage. No mass lesion, midline shift or mass effect. No hydrocephalus. No extra-axial fluid collection. Major dural sinuses are grossly patent. Pituitary gland and suprasellar region are normal. Midline structures intact and normal. Vascular: Major intracranial vascular flow voids well maintained and normal in appearance. Skull and upper cervical spine: Craniocervical junction normal. Visualized upper cervical spine within normal limits. Bone marrow signal intensity normal. No scalp soft tissue abnormality. Sinuses/Orbits: Globes and orbital soft tissues within normal limits. Minimal mucosal thickening noted within the ethmoidal air cells. Paranasal sinuses are otherwise clear. No mastoid effusion. Inner ear structures grossly normal. Other: None. MRA HEAD FINDINGS ANTERIOR CIRCULATION: Visualized distal cervical segments of the internal carotid arteries are patent with symmetric antegrade flow. Petrous, cavernous, and supraclinoid segments widely patent without stenosis or other abnormality. A1 segments patent bilaterally. Normal anterior communicating artery complex. Anterior cerebral arteries patent to their distal aspects without stenosis. No M1 stenosis or occlusion.  Normal MCA bifurcations. Distal MCA branches well perfused and symmetric. POSTERIOR CIRCULATION: Dominant left V4 segment widely patent without stenosis. Patent left PICA origin. Right V4 segment diffusely hypoplastic but patent as well. Right PICA not seen. Basilar patent to its distal aspect without stenosis. Superior cerebral arteries patent bilaterally. Both PCAs primarily supplied via the basilar. Left PCA widely patent and well perfused to its distal aspect. There is apparent occlusion of the proximal  right P2 segment (series 1054, image 12). No significant flow within the right PCA distribution distally evident by MRA. No intracranial aneurysm. MRA NECK FINDINGS AORTIC ARCH: Visualized aortic arch of normal caliber with normal 3 vessel morphology. No hemodynamically significant stenosis seen about the origin of the great vessels. RIGHT CAROTID SYSTEM: Right common and internal carotid arteries widely patent without stenosis, evidence for dissection, or occlusion. No significant atheromatous irregularity or narrowing about the right carotid bifurcation. LEFT CAROTID SYSTEM: Left common and internal carotid arteries widely patent without stenosis, evidence for dissection or occlusion. No significant atheromatous irregularity or narrowing about the left carotid bifurcation. VERTEBRAL ARTERIES: Both vertebral arteries arise from the subclavian arteries. Strongly dominant left vertebral artery with a diffusely hypoplastic right vertebral artery. Vertebral arteries widely patent within the neck without stenosis, evidence for dissection, or occlusion. IMPRESSION: MRI HEAD IMPRESSION: 1. 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 2. Underlying mild chronic microvascular ischemic disease for age. MRA HEAD IMPRESSION: 1. Proximal right P2 occlusion, consistent with the acute right PCA territory infarct. There may be an underlying chronic stenosis at this location given the relative small size of the infarct. 2. Otherwise normal intracranial MRA. No other large vessel occlusion, hemodynamically significant stenosis, or other acute vascular abnormality. MRA NECK IMPRESSION: Normal MRA of the neck. Electronically Signed   By: Rise MuBenjamin  McClintock M.D.   On: 04/17/2020 22:37   MR BRAIN WO CONTRAST  Result Date: 04/17/2020 CLINICAL DATA:  Initial evaluation for acute neuro deficit, acute stroke suspected. Transient numbness and vibratory sensation  involving the left-sided body. Headache. EXAM: MRI HEAD WITHOUT CONTRAST MRA HEAD WITHOUT CONTRAST MRA NECK WITHOUT AND WITH CONTRAST TECHNIQUE: Multiplanar, multiecho pulse sequences of the brain and surrounding structures were obtained without intravenous contrast. Angiographic images of the Circle of Willis were obtained using MRA technique without intravenous contrast. Angiographic images of the neck were obtained using MRA technique without and with intravenous contrast. Carotid stenosis measurements (when applicable) are obtained utilizing NASCET criteria, using the distal internal carotid diameter as the denominator. CONTRAST:  5.498mL GADAVIST GADOBUTROL 1 MMOL/ML IV SOLN COMPARISON:  Prior head CT from earlier the same day. FINDINGS: MRI HEAD FINDINGS Brain: Cerebral volume within normal limits for patient age. Mild patchy T2/FLAIR hyperintensity seen within the periventricular white matter and pons, most likely related chronic small vessel ischemic disease, mild in nature. Probable small remote lacunar infarct noted at the left midbrain (series 16, image 23). 6 mm focus of restricted diffusion seen involving the cortical gray matter of the right occipital lobe, consistent with an acute ischemic infarct (series 5, image 73), right PCA distribution. No associated hemorrhage or mass effect. No other diffusion abnormality to suggest acute or subacute ischemia. Gray-white matter differentiation otherwise maintained. No encephalomalacia to suggest chronic cortical infarction elsewhere within the brain. No foci of susceptibility artifact to suggest acute or chronic intracranial hemorrhage. No mass lesion, midline shift or mass effect. No hydrocephalus. No extra-axial fluid collection. Major dural sinuses are grossly patent. Pituitary  gland and suprasellar region are normal. Midline structures intact and normal. Vascular: Major intracranial vascular flow voids well maintained and normal in appearance. Skull and  upper cervical spine: Craniocervical junction normal. Visualized upper cervical spine within normal limits. Bone marrow signal intensity normal. No scalp soft tissue abnormality. Sinuses/Orbits: Globes and orbital soft tissues within normal limits. Minimal mucosal thickening noted within the ethmoidal air cells. Paranasal sinuses are otherwise clear. No mastoid effusion. Inner ear structures grossly normal. Other: None. MRA HEAD FINDINGS ANTERIOR CIRCULATION: Visualized distal cervical segments of the internal carotid arteries are patent with symmetric antegrade flow. Petrous, cavernous, and supraclinoid segments widely patent without stenosis or other abnormality. A1 segments patent bilaterally. Normal anterior communicating artery complex. Anterior cerebral arteries patent to their distal aspects without stenosis. No M1 stenosis or occlusion. Normal MCA bifurcations. Distal MCA branches well perfused and symmetric. POSTERIOR CIRCULATION: Dominant left V4 segment widely patent without stenosis. Patent left PICA origin. Right V4 segment diffusely hypoplastic but patent as well. Right PICA not seen. Basilar patent to its distal aspect without stenosis. Superior cerebral arteries patent bilaterally. Both PCAs primarily supplied via the basilar. Left PCA widely patent and well perfused to its distal aspect. There is apparent occlusion of the proximal right P2 segment (series 1054, image 12). No significant flow within the right PCA distribution distally evident by MRA. No intracranial aneurysm. MRA NECK FINDINGS AORTIC ARCH: Visualized aortic arch of normal caliber with normal 3 vessel morphology. No hemodynamically significant stenosis seen about the origin of the great vessels. RIGHT CAROTID SYSTEM: Right common and internal carotid arteries widely patent without stenosis, evidence for dissection, or occlusion. No significant atheromatous irregularity or narrowing about the right carotid bifurcation. LEFT CAROTID  SYSTEM: Left common and internal carotid arteries widely patent without stenosis, evidence for dissection or occlusion. No significant atheromatous irregularity or narrowing about the left carotid bifurcation. VERTEBRAL ARTERIES: Both vertebral arteries arise from the subclavian arteries. Strongly dominant left vertebral artery with a diffusely hypoplastic right vertebral artery. Vertebral arteries widely patent within the neck without stenosis, evidence for dissection, or occlusion. IMPRESSION: MRI HEAD IMPRESSION: 1. 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 2. Underlying mild chronic microvascular ischemic disease for age. MRA HEAD IMPRESSION: 1. Proximal right P2 occlusion, consistent with the acute right PCA territory infarct. There may be an underlying chronic stenosis at this location given the relative small size of the infarct. 2. Otherwise normal intracranial MRA. No other large vessel occlusion, hemodynamically significant stenosis, or other acute vascular abnormality. MRA NECK IMPRESSION: Normal MRA of the neck. Electronically Signed   By: Rise Mu M.D.   On: 04/17/2020 22:37    Procedures Procedures (including critical care time)  Medications Ordered in ED Medications  acetaminophen (TYLENOL) tablet 650 mg (has no administration in time range)    Or  acetaminophen (TYLENOL) 160 MG/5ML solution 650 mg (has no administration in time range)    Or  acetaminophen (TYLENOL) suppository 650 mg (has no administration in time range)  enoxaparin (LOVENOX) injection 40 mg (40 mg Subcutaneous Given 04/18/20 1113)  aspirin suppository 300 mg ( Rectal See Alternative 04/18/20 1112)    Or  aspirin tablet 325 mg (325 mg Oral Given 04/18/20 1112)  atorvastatin (LIPITOR) tablet 40 mg (40 mg Oral Refused 04/18/20 1113)  sodium chloride flush (NS) 0.9 % injection 3 mL (3 mLs Intravenous Given 04/17/20 1953)  gadobutrol  (GADAVIST) 1 MMOL/ML injection 5.8 mL (5.8 mLs Intravenous  Contrast Given 04/17/20 2153)  aspirin EC tablet 325 mg (325 mg Oral Given 04/17/20 2334)   stroke: mapping our early stages of recovery book ( Does not apply Given 04/18/20 0701)    ED Course  I have reviewed the triage vital signs and the nursing notes.  Pertinent labs & imaging results that were available during my care of the patient were reviewed by me and considered in my medical decision making (see chart for details).  Clinical Course as of Apr 18 1129  Tue Apr 17, 2020  1610 Discussed with neurology Dr. Thomasena Edis.  He recommends MRI MRA head and neck.  Patient agreeable to plan   [MB]  2330 Discussed with [facility] hospitalist Dr. Alinda Money who will evaluate the patient for admission.   [MB]  2338 Discussed with neurology Dr. Thomasena Edis who will consult on the patient input and recommendations.   [MB]    Clinical Course User Index [MB] Terrilee Files, MD   MDM Rules/Calculators/A&P                         This patient complains of 2 minutes of left-sided numbness, intermittent headache, intermittent peripheral vision disturbance; this involves an extensive number of treatment Options and is a complaint that carries with it a high risk of complications and Morbidity. The differential includes stroke, TIA, complex migraine, peripheral nerve  I ordered, reviewed and interpreted labs, which included CBC with normal white count normal hemoglobin, chemistries normal, coags normal, Covid testing negative I ordered medication aspirin I ordered imaging studies which included head CT, MRI/MRA head and neck and I independently    visualized and interpreted imaging which showed acute stroke Additional history obtained from patient's husband Previous records obtained and reviewed in epic, no recent admissions I consulted Dr. Thomasena Edis neurology and Dr. Alinda Money [facility] hospital[facility] and discussed lab and imaging findings  Critical Interventions:  None  After the interventions stated above, I reevaluated the patient and found patient to be asymptomatic.  Explained recommendations from neurology and she is agreeable to admission.   Final Clinical Impression(s) / ED Diagnoses Final diagnoses:  Primary hypertension  Acute CVA (cerebrovascular accident) [facility])    Rx / DC Orders ED Discharge Orders    None       Terrilee Files, MD 04/18/20 1134

## 2020-04-17 NOTE — ED Triage Notes (Signed)
Pt advised by PCP to come here for further eval of 2 minute episode that occurred approx 45 mins PTA of numbness and vibrating sensation over the L side of her body. Symptoms resolved but pt endorses dull headache and endorses visual disturbance in L peripheral vision over the last few days.Vicki Stevens

## 2020-04-17 NOTE — H&P (Signed)
History and Physical   Vicki Stevens ZOX:[id number] DOB: [birth year] DOA: 04/17/2020  PCP: Madelin HeadingsPanosh, Wanda K, MD   Patient coming from: Home  Chief Complaint: Left-sided numbness  HPI: Vicki ManifoldSandra L Perman is a 64 y.o. female with medical history significant of hypertension, insomnia who presents after an episode of left-sided numbness.  She states that she had an episode of left-sided numbness with some vibration sensation lasting about 2 minutes earlier today.  She additionally reports that she has had about 1 week of waxing and waning headache; 5 days of intermittent episodes of left peripheral vision changes that lasts for about 1 minute at a time and have occurred about 4 separate times in the past 5 days; and then the episode of left-sided numbness and vibration sensation which was followed by a dull headache and prompted her to present to the ED. She denies fever, cough, chest pain, shortness of breath, abdominal pain, nausea, constipation, diarrhea.  ED Course: Vitals in ED significant for blood pressure in the 170s to 200s systolic.  Lab work-up showed normal BMP, LFTs with protein of 6.2, CBC normal, PT PTT INR normal.  Respiratory panel for flu and Covid pending.  CT head was without acute abnormality.  MR brain showed acute CVA in PCA territory.  MRA showed occlusion of the right P2.  Review of Systems: As per HPI otherwise all other systems reviewed and are negative.  Past Medical History:  Diagnosis Date  . H/O hematuria remote   dr Vonita MossPeterson felt to be from running   . Heart murmur    evaluation ? mvp when younger  . Hx of varicella   . LOW BACK PAIN, ACUTE 03/05/2010    No past surgical history on file.  Social History  reports that she has never smoked. She has never used smokeless tobacco. She reports current alcohol use. No history on file for drug use.  No Known Allergies  Family History  Problem Relation Age of Onset  . Lung cancer Father        [id number]  . Other Mother         MVA 25   Reviewed on admission  Prior to Admission medications   Medication Sig Start Date End Date Taking? Authorizing Provider  Cyanocobalamin 2500 MCG TABS Take by mouth.    [provider]  lisinopril (PRINIVIL,ZESTRIL) 10 MG tablet Take 1 tablet (10 mg total) by mouth daily. 06/30/16   Panosh, Neta MendsWanda K, MD  Multiple Vitamins-Minerals (ADULT GUMMY PO) Take by mouth.    [provider]  Pseudoephedrine-Acetaminophen (SM NON-ASPRIN SINUS PO) Take by mouth.    [provider]    Physical Exam: Vitals:   04/17/20 2233 04/17/20 2245 04/17/20 2300 04/17/20 2315  BP: (!) 194/94 (!) 171/87    Pulse: (!) 56 (!) 56 (!) 54 67  Resp: 18 15 19 12   Temp:      TempSrc:      SpO2: 98% 98% 98% 99%   Physical Exam Constitutional:      General: She is not in acute distress.    Appearance: Normal appearance.  HENT:     Head: Normocephalic and atraumatic.     Mouth/Throat:     Mouth: Mucous membranes are moist.     Pharynx: Oropharynx is clear.  Eyes:     Extraocular Movements: Extraocular movements intact.     Pupils: Pupils are equal, round, and reactive to light.  Cardiovascular:     Rate and Rhythm: Normal rate  and regular rhythm.     Pulses: Normal pulses.     Heart sounds: Normal heart sounds.  Pulmonary:     Effort: Pulmonary effort is normal. No respiratory distress.     Breath sounds: Normal breath sounds.  Abdominal:     General: Bowel sounds are normal. There is no distension.     Palpations: Abdomen is soft.     Tenderness: There is no abdominal tenderness.  Musculoskeletal:        General: No swelling or deformity.  Skin:    General: Skin is warm and dry.  Neurological:     General: No focal deficit present.     Mental Status: Mental status is at baseline.     Comments: Mental Status: Patient is awake, alert, oriented x3 No signs of aphasia or neglect Cranial Nerves: II: Pupils equal, round, and reactive to light.  III,IV, VI: EOMI  without ptosis or diploplia.  V: Facial sensation is symmetric tolight touch VII: Facial movement is symmetric.  VIII: hearing is intact to voice X: Uvula elevates symmetrically XI: Shoulder shrug is symmetric. XII: tongue is midline without atrophy or fasciculations.  Motor: good effort thorughout, at Least 5/5 bilateral UE, 5/5 bilateral lower extremitiy Sensory: Sensation is grossly intact bilateral UEs & LEs Cerebellar: Finger-Nose intact bilat     Labs on Admission: I have personally reviewed following labs and imaging studies  CBC: Recent Labs  Lab 04/17/20 1703 04/17/20 1724  WBC 7.7  --   NEUTROABS 5.2  --   HGB 14.4 14.3  HCT 42.3 42.0  MCV 92.6  --   PLT 292  --     Basic Metabolic Panel: Recent Labs  Lab 04/17/20 1703 04/17/20 1724  NA 140 141  K 3.8 3.8  CL 102 103  CO2 29  --   GLUCOSE 113* 110*  BUN 18 20  CREATININE 0.59 0.50  CALCIUM 9.4  --     GFR: CrCl cannot be calculated (Unknown ideal weight.).  Liver Function Tests: Recent Labs  Lab 04/17/20 1703  AST 19  ALT 17  ALKPHOS 63  BILITOT 0.5  PROT 6.2*  ALBUMIN 3.8    Urine analysis:    Component Value Date/Time   BILIRUBINUR N 06/30/2016 1656   PROTEINUR N 06/30/2016 1656   UROBILINOGEN 0.2 06/30/2016 1656   NITRITE N 06/30/2016 1656   LEUKOCYTESUR Negative 06/30/2016 1656    Radiological Exams on Admission: CT HEAD WO CONTRAST  Result Date: 04/17/2020 CLINICAL DATA:  Numbness and vibrating sensation over LEFT side of body, 2 minutes episode approximately 45 minutes prior to presentation, symptoms resolved, has dull headache and LEFT peripheral vision disturbance; no history of stroke EXAM: CT HEAD WITHOUT CONTRAST TECHNIQUE: Contiguous axial images were obtained from the base of the skull through the vertex without intravenous contrast. Sagittal and coronal MPR images reconstructed from axial data set. COMPARISON:  None FINDINGS: Brain: Normal ventricular morphology. No  midline shift or mass effect. Normal appearance of brain parenchyma. No intracranial hemorrhage, mass lesion or evidence of acute infarction. No extra-axial fluid collections. Vascular: No hyperdense vessels Skull: Intact Sinuses/Orbits: Clear Other: N/A IMPRESSION: Normal exam. Electronically Signed   By: Ulyses Southward M.D.   On: 04/17/2020 18:21   MR ANGIO HEAD WO CONTRAST  Result Date: 04/17/2020 CLINICAL DATA:  Initial evaluation for acute neuro deficit, acute stroke suspected. Transient numbness and vibratory sensation involving the left-sided body. Headache. EXAM: MRI HEAD WITHOUT CONTRAST MRA HEAD WITHOUT CONTRAST MRA NECK  WITHOUT AND WITH CONTRAST TECHNIQUE: Multiplanar, multiecho pulse sequences of the brain and surrounding structures were obtained without intravenous contrast. Angiographic images of the Circle of Willis were obtained using MRA technique without intravenous contrast. Angiographic images of the neck were obtained using MRA technique without and with intravenous contrast. Carotid stenosis measurements (when applicable) are obtained utilizing NASCET criteria, using the distal internal carotid diameter as the denominator. CONTRAST:  5.9mL GADAVIST GADOBUTROL 1 MMOL/ML IV SOLN COMPARISON:  Prior head CT from earlier the same day. FINDINGS: MRI HEAD FINDINGS Brain: Cerebral volume within normal limits for patient age. Mild patchy T2/FLAIR hyperintensity seen within the periventricular white matter and pons, most likely related chronic small vessel ischemic disease, mild in nature. Probable small remote lacunar infarct noted at the left midbrain (series 16, image 23). 6 mm focus of restricted diffusion seen involving the cortical gray matter of the right occipital lobe, consistent with an acute ischemic infarct (series 5, image 73), right PCA distribution. No associated hemorrhage or mass effect. No other diffusion abnormality to suggest acute or subacute ischemia. Gray-white matter  differentiation otherwise maintained. No encephalomalacia to suggest chronic cortical infarction elsewhere within the brain. No foci of susceptibility artifact to suggest acute or chronic intracranial hemorrhage. No mass lesion, midline shift or mass effect. No hydrocephalus. No extra-axial fluid collection. Major dural sinuses are grossly patent. Pituitary gland and suprasellar region are normal. Midline structures intact and normal. Vascular: Major intracranial vascular flow voids well maintained and normal in appearance. Skull and upper cervical spine: Craniocervical junction normal. Visualized upper cervical spine within normal limits. Bone marrow signal intensity normal. No scalp soft tissue abnormality. Sinuses/Orbits: Globes and orbital soft tissues within normal limits. Minimal mucosal thickening noted within the ethmoidal air cells. Paranasal sinuses are otherwise clear. No mastoid effusion. Inner ear structures grossly normal. Other: None. MRA HEAD FINDINGS ANTERIOR CIRCULATION: Visualized distal cervical segments of the internal carotid arteries are patent with symmetric antegrade flow. Petrous, cavernous, and supraclinoid segments widely patent without stenosis or other abnormality. A1 segments patent bilaterally. Normal anterior communicating artery complex. Anterior cerebral arteries patent to their distal aspects without stenosis. No M1 stenosis or occlusion. Normal MCA bifurcations. Distal MCA branches well perfused and symmetric. POSTERIOR CIRCULATION: Dominant left V4 segment widely patent without stenosis. Patent left PICA origin. Right V4 segment diffusely hypoplastic but patent as well. Right PICA not seen. Basilar patent to its distal aspect without stenosis. Superior cerebral arteries patent bilaterally. Both PCAs primarily supplied via the basilar. Left PCA widely patent and well perfused to its distal aspect. There is apparent occlusion of the proximal right P2 segment (series 1054, image  12). No significant flow within the right PCA distribution distally evident by MRA. No intracranial aneurysm. MRA NECK FINDINGS AORTIC ARCH: Visualized aortic arch of normal caliber with normal 3 vessel morphology. No hemodynamically significant stenosis seen about the origin of the great vessels. RIGHT CAROTID SYSTEM: Right common and internal carotid arteries widely patent without stenosis, evidence for dissection, or occlusion. No significant atheromatous irregularity or narrowing about the right carotid bifurcation. LEFT CAROTID SYSTEM: Left common and internal carotid arteries widely patent without stenosis, evidence for dissection or occlusion. No significant atheromatous irregularity or narrowing about the left carotid bifurcation. VERTEBRAL ARTERIES: Both vertebral arteries arise from the subclavian arteries. Strongly dominant left vertebral artery with a diffusely hypoplastic right vertebral artery. Vertebral arteries widely patent within the neck without stenosis, evidence for dissection, or occlusion. IMPRESSION: MRI HEAD IMPRESSION: 1. 6 mm acute ischemic  nonhemorrhagic cortical infarct involving the right occipital lobe, right PCA distribution. 2. Underlying mild chronic microvascular ischemic disease for age. MRA HEAD IMPRESSION: 1. Proximal right P2 occlusion, consistent with the acute right PCA territory infarct. There may be an underlying chronic stenosis at this location given the relative small size of the infarct. 2. Otherwise normal intracranial MRA. No other large vessel occlusion, hemodynamically significant stenosis, or other acute vascular abnormality. MRA NECK IMPRESSION: Normal MRA of the neck. Electronically Signed   By: Rise Mu M.D.   On: 04/17/2020 22:37   MR Angiogram Neck W or Wo Contrast  Result Date: 04/17/2020 CLINICAL DATA:  Initial evaluation for acute neuro deficit, acute stroke suspected. Transient numbness and vibratory sensation involving the left-sided body.  Headache. EXAM: MRI HEAD WITHOUT CONTRAST MRA HEAD WITHOUT CONTRAST MRA NECK WITHOUT AND WITH CONTRAST TECHNIQUE: Multiplanar, multiecho pulse sequences of the brain and surrounding structures were obtained without intravenous contrast. Angiographic images of the Circle of Willis were obtained using MRA technique without intravenous contrast. Angiographic images of the neck were obtained using MRA technique without and with intravenous contrast. Carotid stenosis measurements (when applicable) are obtained utilizing NASCET criteria, using the distal internal carotid diameter as the denominator. CONTRAST:  5.56mL GADAVIST GADOBUTROL 1 MMOL/ML IV SOLN COMPARISON:  Prior head CT from earlier the same day. FINDINGS: MRI HEAD FINDINGS Brain: Cerebral volume within normal limits for patient age. Mild patchy T2/FLAIR hyperintensity seen within the periventricular white matter and pons, most likely related chronic small vessel ischemic disease, mild in nature. Probable small remote lacunar infarct noted at the left midbrain (series 16, image 23). 6 mm focus of restricted diffusion seen involving the cortical gray matter of the right occipital lobe, consistent with an acute ischemic infarct (series 5, image 73), right PCA distribution. No associated hemorrhage or mass effect. No other diffusion abnormality to suggest acute or subacute ischemia. Gray-white matter differentiation otherwise maintained. No encephalomalacia to suggest chronic cortical infarction elsewhere within the brain. No foci of susceptibility artifact to suggest acute or chronic intracranial hemorrhage. No mass lesion, midline shift or mass effect. No hydrocephalus. No extra-axial fluid collection. Major dural sinuses are grossly patent. Pituitary gland and suprasellar region are normal. Midline structures intact and normal. Vascular: Major intracranial vascular flow voids well maintained and normal in appearance. Skull and upper cervical spine:  Craniocervical junction normal. Visualized upper cervical spine within normal limits. Bone marrow signal intensity normal. No scalp soft tissue abnormality. Sinuses/Orbits: Globes and orbital soft tissues within normal limits. Minimal mucosal thickening noted within the ethmoidal air cells. Paranasal sinuses are otherwise clear. No mastoid effusion. Inner ear structures grossly normal. Other: None. MRA HEAD FINDINGS ANTERIOR CIRCULATION: Visualized distal cervical segments of the internal carotid arteries are patent with symmetric antegrade flow. Petrous, cavernous, and supraclinoid segments widely patent without stenosis or other abnormality. A1 segments patent bilaterally. Normal anterior communicating artery complex. Anterior cerebral arteries patent to their distal aspects without stenosis. No M1 stenosis or occlusion. Normal MCA bifurcations. Distal MCA branches well perfused and symmetric. POSTERIOR CIRCULATION: Dominant left V4 segment widely patent without stenosis. Patent left PICA origin. Right V4 segment diffusely hypoplastic but patent as well. Right PICA not seen. Basilar patent to its distal aspect without stenosis. Superior cerebral arteries patent bilaterally. Both PCAs primarily supplied via the basilar. Left PCA widely patent and well perfused to its distal aspect. There is apparent occlusion of the proximal right P2 segment (series 1054, image 12). No significant flow within the right  PCA distribution distally evident by MRA. No intracranial aneurysm. MRA NECK FINDINGS AORTIC ARCH: Visualized aortic arch of normal caliber with normal 3 vessel morphology. No hemodynamically significant stenosis seen about the origin of the great vessels. RIGHT CAROTID SYSTEM: Right common and internal carotid arteries widely patent without stenosis, evidence for dissection, or occlusion. No significant atheromatous irregularity or narrowing about the right carotid bifurcation. LEFT CAROTID SYSTEM: Left common and  internal carotid arteries widely patent without stenosis, evidence for dissection or occlusion. No significant atheromatous irregularity or narrowing about the left carotid bifurcation. VERTEBRAL ARTERIES: Both vertebral arteries arise from the subclavian arteries. Strongly dominant left vertebral artery with a diffusely hypoplastic right vertebral artery. Vertebral arteries widely patent within the neck without stenosis, evidence for dissection, or occlusion. IMPRESSION: MRI HEAD IMPRESSION: 1. 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 2. Underlying mild chronic microvascular ischemic disease for age. MRA HEAD IMPRESSION: 1. Proximal right P2 occlusion, consistent with the acute right PCA territory infarct. There may be an underlying chronic stenosis at this location given the relative small size of the infarct. 2. Otherwise normal intracranial MRA. No other large vessel occlusion, hemodynamically significant stenosis, or other acute vascular abnormality. MRA NECK IMPRESSION: Normal MRA of the neck. Electronically Signed   By: Rise Mu M.D.   On: 04/17/2020 22:37   MR BRAIN WO CONTRAST  Result Date: 04/17/2020 CLINICAL DATA:  Initial evaluation for acute neuro deficit, acute stroke suspected. Transient numbness and vibratory sensation involving the left-sided body. Headache. EXAM: MRI HEAD WITHOUT CONTRAST MRA HEAD WITHOUT CONTRAST MRA NECK WITHOUT AND WITH CONTRAST TECHNIQUE: Multiplanar, multiecho pulse sequences of the brain and surrounding structures were obtained without intravenous contrast. Angiographic images of the Circle of Willis were obtained using MRA technique without intravenous contrast. Angiographic images of the neck were obtained using MRA technique without and with intravenous contrast. Carotid stenosis measurements (when applicable) are obtained utilizing NASCET criteria, using the distal internal carotid  diameter as the denominator. CONTRAST:  5.70mL GADAVIST GADOBUTROL 1 MMOL/ML IV SOLN COMPARISON:  Prior head CT from earlier the same day. FINDINGS: MRI HEAD FINDINGS Brain: Cerebral volume within normal limits for patient age. Mild patchy T2/FLAIR hyperintensity seen within the periventricular white matter and pons, most likely related chronic small vessel ischemic disease, mild in nature. Probable small remote lacunar infarct noted at the left midbrain (series 16, image 23). 6 mm focus of restricted diffusion seen involving the cortical gray matter of the right occipital lobe, consistent with an acute ischemic infarct (series 5, image 73), right PCA distribution. No associated hemorrhage or mass effect. No other diffusion abnormality to suggest acute or subacute ischemia. Gray-white matter differentiation otherwise maintained. No encephalomalacia to suggest chronic cortical infarction elsewhere within the brain. No foci of susceptibility artifact to suggest acute or chronic intracranial hemorrhage. No mass lesion, midline shift or mass effect. No hydrocephalus. No extra-axial fluid collection. Major dural sinuses are grossly patent. Pituitary gland and suprasellar region are normal. Midline structures intact and normal. Vascular: Major intracranial vascular flow voids well maintained and normal in appearance. Skull and upper cervical spine: Craniocervical junction normal. Visualized upper cervical spine within normal limits. Bone marrow signal intensity normal. No scalp soft tissue abnormality. Sinuses/Orbits: Globes and orbital soft tissues within normal limits. Minimal mucosal thickening noted within the ethmoidal air cells. Paranasal sinuses are otherwise clear. No mastoid effusion. Inner ear structures grossly normal. Other: None. MRA HEAD FINDINGS ANTERIOR CIRCULATION: Visualized distal  cervical segments of the internal carotid arteries are patent with symmetric antegrade flow. Petrous, cavernous, and  supraclinoid segments widely patent without stenosis or other abnormality. A1 segments patent bilaterally. Normal anterior communicating artery complex. Anterior cerebral arteries patent to their distal aspects without stenosis. No M1 stenosis or occlusion. Normal MCA bifurcations. Distal MCA branches well perfused and symmetric. POSTERIOR CIRCULATION: Dominant left V4 segment widely patent without stenosis. Patent left PICA origin. Right V4 segment diffusely hypoplastic but patent as well. Right PICA not seen. Basilar patent to its distal aspect without stenosis. Superior cerebral arteries patent bilaterally. Both PCAs primarily supplied via the basilar. Left PCA widely patent and well perfused to its distal aspect. There is apparent occlusion of the proximal right P2 segment (series 1054, image 12). No significant flow within the right PCA distribution distally evident by MRA. No intracranial aneurysm. MRA NECK FINDINGS AORTIC ARCH: Visualized aortic arch of normal caliber with normal 3 vessel morphology. No hemodynamically significant stenosis seen about the origin of the great vessels. RIGHT CAROTID SYSTEM: Right common and internal carotid arteries widely patent without stenosis, evidence for dissection, or occlusion. No significant atheromatous irregularity or narrowing about the right carotid bifurcation. LEFT CAROTID SYSTEM: Left common and internal carotid arteries widely patent without stenosis, evidence for dissection or occlusion. No significant atheromatous irregularity or narrowing about the left carotid bifurcation. VERTEBRAL ARTERIES: Both vertebral arteries arise from the subclavian arteries. Strongly dominant left vertebral artery with a diffusely hypoplastic right vertebral artery. Vertebral arteries widely patent within the neck without stenosis, evidence for dissection, or occlusion. IMPRESSION: MRI HEAD IMPRESSION: 1. 6 mm acute ischemic nonhemorrhagic cortical infarct involving the right  occipital lobe, right PCA distribution. 2. Underlying mild chronic microvascular ischemic disease for age. MRA HEAD IMPRESSION: 1. Proximal right P2 occlusion, consistent with the acute right PCA territory infarct. There may be an underlying chronic stenosis at this location given the relative small size of the infarct. 2. Otherwise normal intracranial MRA. No other large vessel occlusion, hemodynamically significant stenosis, or other acute vascular abnormality. MRA NECK IMPRESSION: Normal MRA of the neck. Electronically Signed   By: Rise Mu M.D.   On: 04/17/2020 22:37    EKG: Independently reviewed.  Normal sinus rhythm, no previous to compare to.  Assessment/Plan Principal Problem:   CVA (cerebral vascular accident) [facility]) Active Problems:   Essential hypertension  CVA > Left peripheral vision changes, 2-minute episode of left-sided numbness with vibration sensation. > CT head was without acute abnormality.  MR brain showed acute CVA in PCA territory.  MRA showed occlusion of the right P2. - Neurology has been consulted by EDP - Allow for permissive HTN systolic < 220 and diastolic < 120 - ASA 325 mg / 409 mg daily  - Start Atorvastatin  - Echocardiogram   - A1C  - Lipid panel  - Tele monitoring  - SLP eval - PT/OT  Hypertension - Holding home antihypertensives for permissive hypertension  DVT prophylaxis: Lovenox Code Status:   Full  Family Communication:  None on admission Disposition Plan:   Patient is from:  Home  Anticipated DC to:  Home  Anticipated DC date:  12/8, pending work-up  Anticipated DC barriers: None  Consults called:  Neurology, consulted by EDP  Admission status:  observation, telemetry  Severity of Illness: The appropriate patient status for this patient is OBSERVATION. Observation status is judged to be reasonable and necessary in order to provide the required intensity of service to ensure the patient's safety. The  patient's presenting  symptoms, physical exam findings, and initial radiographic and laboratory data in the context of their medical condition is felt to place them at decreased risk for further clinical deterioration. Furthermore, it is anticipated that the patient will be medically stable for discharge from the hospital within 2 midnights of admission. The following factors support the patient status of observation.   " The patient's presenting symptoms include left peripheral vision changes, left-sided numbness and vibration sensation. " The physical exam findings include stable. " The initial radiographic and laboratory data are consistent with acute stroke with a PCA territory with occlusion of the right P2.   Synetta Fail MD [facility]  How to contact the [facility] Attending or Consulting provider 7A - 7P or covering provider during after hours 7P -7A, for this patient?   1. Check the care team in [facility] and look for a) attending/consulting TRH provider listed and b) the [facility] team listed 2. Log into [email or web address] and use 's universal password to access. If you do not have the password, please contact the hospital operator. 3. Locate the [facility] provider you are looking for under [facility] and page to a number that you can be directly reached. 4. If you still have difficulty reaching the provider, please page the [facility] (Director on Call) for the Hospitalists listed on amion for assistance.  04/17/2020, 11:42 PM

## 2020-04-18 ENCOUNTER — Observation Stay (HOSPITAL_BASED_OUTPATIENT_CLINIC_OR_DEPARTMENT_OTHER): Payer: 59

## 2020-04-18 ENCOUNTER — Encounter (HOSPITAL_COMMUNITY): Payer: Self-pay | Admitting: Internal Medicine

## 2020-04-18 ENCOUNTER — Encounter: Payer: Self-pay | Admitting: Neurology

## 2020-04-18 DIAGNOSIS — I6389 Other cerebral infarction: Secondary | ICD-10-CM | POA: Diagnosis not present

## 2020-04-18 DIAGNOSIS — I10 Essential (primary) hypertension: Secondary | ICD-10-CM | POA: Diagnosis not present

## 2020-04-18 DIAGNOSIS — I639 Cerebral infarction, unspecified: Secondary | ICD-10-CM

## 2020-04-18 DIAGNOSIS — I6300 Cerebral infarction due to thrombosis of unspecified precerebral artery: Secondary | ICD-10-CM | POA: Diagnosis not present

## 2020-04-18 LAB — RAPID URINE DRUG SCREEN, HOSP PERFORMED
Amphetamines: NOT DETECTED
Barbiturates: NOT DETECTED
Benzodiazepines: NOT DETECTED
Cocaine: NOT DETECTED
Opiates: NOT DETECTED
Tetrahydrocannabinol: NOT DETECTED

## 2020-04-18 LAB — HEMOGLOBIN A1C
Hgb A1c MFr Bld: 5.3 % (ref 4.8–5.6)
Mean Plasma Glucose: 105.41 mg/dL

## 2020-04-18 LAB — ECHOCARDIOGRAM COMPLETE
Area-P 1/2: 3.12 cm2
S' Lateral: 3.5 cm

## 2020-04-18 LAB — RESP PANEL BY RT-PCR (FLU A&B, COVID) ARPGX2
Influenza A by PCR: NEGATIVE
Influenza B by PCR: NEGATIVE
SARS Coronavirus 2 by RT PCR: NEGATIVE

## 2020-04-18 LAB — LIPID PANEL
Cholesterol: 226 mg/dL — ABNORMAL HIGH (ref 0–200)
HDL: 81 mg/dL (ref >40)
LDL Cholesterol: 133 mg/dL — ABNORMAL HIGH (ref 0–99)
Total CHOL/HDL Ratio: 2.8 RATIO
Triglycerides: 60 mg/dL (ref <150)
VLDL: 12 mg/dL (ref 0–40)

## 2020-04-18 MED ORDER — SODIUM CHLORIDE 0.9 % IV SOLN: INTRAVENOUS | Status: DC

## 2020-04-18 NOTE — Progress Notes (Signed)
    [facility] has been requested to perform a transesophageal echocardiogram on 04/19/20 for Dr. Jacques Navy for Stroke.  After careful review of history and examination, the risks and benefits of transesophageal echocardiogram have been explained including risks of esophageal damage, perforation (1:10,000 risk), bleeding, pharyngeal hematoma as well as other potential complications associated with conscious sedation including aspiration, arrhythmia, respiratory failure and death. Alternatives to treatment were discussed, questions were answered. Patient is willing to proceed. Labs and vital signs stable at the time of consent.   Laverda Page, NP-C 04/18/2020 4:35 PM

## 2020-04-18 NOTE — Progress Notes (Signed)
SLP Cancellation Note  Patient Details Name: Vicki MCCOSH MRN: [id number] DOB: Sep 19, 1955   Cancelled treatment:       Reason Eval/Treat Not Completed: SLP screened, no needs identified, will sign off   [name], [name] 04/18/2020, 11:49 AM

## 2020-04-18 NOTE — H&P (View-Only) (Signed)
    [facility] has been requested to perform a transesophageal echocardiogram on 04/19/20 for Dr. Acharya for Stroke.  After careful review of history and examination, the risks and benefits of transesophageal echocardiogram have been explained including risks of esophageal damage, perforation (1:10,000 risk), bleeding, pharyngeal hematoma as well as other potential complications associated with conscious sedation including aspiration, arrhythmia, respiratory failure and death. Alternatives to treatment were discussed, questions were answered. Patient is willing to proceed. Labs and vital signs stable at the time of consent.   Hendry Speas, NP-C 04/18/2020 4:35 PM  

## 2020-04-18 NOTE — Progress Notes (Signed)
PROGRESS NOTE  Vicki Stevens ZOX:[id number] DOB: 05/23/1955 DOA: 04/17/2020 PCP: Madelin HeadingsPanosh, Wanda K, MD  HPI/Recap of past 24 hours: Vicki Stevens is a 64 y.o. female with medical history significant of hypertension, insomnia who presents after an episode of left-sided numbness.  She states that she had an episode of left-sided numbness with some vibration sensation lasting about 2 minutes. She additionally reports that she has had about 1 week of waxing and waning headache; 5 days of intermittent episodes of left peripheral vision changes that lasts for about 1 minute at a time and have occurred about 4 separate times in the past 5 days; and then the episode of left-sided numbness and vibration sensation which was followed by a dull headache and prompted her to present to the ED. In the ED, vitals significant for BP in the 170s to 200s systolic.  Lab work-up unremarkable. Respiratory panel for flu and Covid negative. CT head was without acute abnormality.  MR brain showed acute CVA in PCA territory.  MRA showed occlusion of the right P2.  Neurology consulted.  Patient admitted for further management.    Today, patient denies any new complaints, somewhat frustrated about diagnosis.  Had numerous concerns/questions, answered accordingly.  Denies any numbness, vision changes, worsening headache, chest pain, abdominal pain, nausea/vomiting, fever/chills.  Assessment/Plan: Principal Problem:   CVA (cerebral vascular accident) (HCC) Active Problems:   Essential hypertension  Acute CVA MR brain showed acute CVA in the PCA territory, MRA showed occlusion of the right P2 A1c 5.3, LDL 133 Echo pending PT/OT/SLP Neurology on board, plan for possible TEE if TTE is unremarkable Continue ASA, Lipitor Neurochecks, telemetry monitoring  Hypertension Allow permissive hypertension, hold home lisinopril  Hyperlipidemia Start Lipitor           Malnutrition Type:      Malnutrition  Characteristics:      Nutrition Interventions:       Estimated body mass index is 23.38 kg/m as calculated from the following:   Height as of 07/22/16: 5' 2.5" (1.588 m).   Weight as of 07/22/16: 58.9 kg.     Code Status: Full  Family Communication: Discussed extensively with patient  Disposition Plan: Status is: Observation  The patient remains OBS appropriate and will d/c before 2 midnights.  Dispo: The patient is from: Home              Anticipated d/c is to: Home              Anticipated d/c date is: 1 day              Patient currently is not medically stable to d/c.   Consultants:  Neurology  Procedures:  None  Antimicrobials:  None  DVT prophylaxis: Lovenox   Objective: Vitals:   04/18/20 1100 04/18/20 1227 04/18/20 1241 04/18/20 1304  BP: (!) 184/96 (!) 167/88  (!) 189/91  Pulse: 61 (!) 57  66  Resp: 14 18  16   Temp:   98 F (36.7 C) 98 F (36.7 C)  TempSrc:   Oral Oral  SpO2: 97% 95%  97%   No intake or output data in the 24 hours ending 04/18/20 1409 There were no vitals filed for this visit.  Exam:  General: NAD   Cardiovascular: S1, S2 present  Respiratory: CTAB  Abdomen: Soft, nontender, nondistended, bowel sounds present  Musculoskeletal: No bilateral pedal edema noted  Skin: Normal  Psychiatry: Normal mood  Neurology: Strength equal in all extremities, 5/5,  intact sensation, no obvious focal neurologic deficit noted    Data Reviewed: CBC: Recent Labs  Lab 04/17/20 1703 04/17/20 1724  WBC 7.7  --   NEUTROABS 5.2  --   HGB 14.4 14.3  HCT 42.3 42.0  MCV 92.6  --   PLT 292  --    Basic Metabolic Panel: Recent Labs  Lab 04/17/20 1703 04/17/20 1724  NA 140 141  K 3.8 3.8  CL 102 103  CO2 29  --   GLUCOSE 113* 110*  BUN 18 20  CREATININE 0.59 0.50  CALCIUM 9.4  --    GFR: CrCl cannot be calculated (Unknown ideal weight.). Liver Function Tests: Recent Labs  Lab 04/17/20 1703  AST 19  ALT 17   ALKPHOS 63  BILITOT 0.5  PROT 6.2*  ALBUMIN 3.8   No results for input(s): LIPASE, AMYLASE in the last 168 hours. No results for input(s): AMMONIA in the last 168 hours. Coagulation Profile: Recent Labs  Lab 04/17/20 1703  INR 1.0   Cardiac Enzymes: No results for input(s): CKTOTAL, CKMB, CKMBINDEX, TROPONINI in the last 168 hours. BNP (last 3 results) No results for input(s): PROBNP in the last 8760 hours. HbA1C: Recent Labs    04/18/20 0534  HGBA1C 5.3   CBG: Recent Labs  Lab 04/17/20 1929  GLUCAP 94   Lipid Profile: Recent Labs    04/18/20 0534  CHOL 226*  HDL 81  LDLCALC 133*  TRIG 60  CHOLHDL 2.8   Thyroid Function Tests: No results for input(s): TSH, T4TOTAL, FREET4, T3FREE, THYROIDAB in the last 72 hours. Anemia Panel: No results for input(s): VITAMINB12, FOLATE, FERRITIN, TIBC, IRON, RETICCTPCT in the last 72 hours. Urine analysis:    Component Value Date/Time   BILIRUBINUR N 06/30/2016 1656   PROTEINUR N 06/30/2016 1656   UROBILINOGEN 0.2 06/30/2016 1656   NITRITE N 06/30/2016 1656   LEUKOCYTESUR Negative 06/30/2016 1656   Sepsis Labs: @LABRCNTIP (procalcitonin:4,lacticidven:4)  ) Recent Results (from the past 240 hour(s))  Resp Panel by RT-PCR (Flu A&B, Covid) Nasopharyngeal Swab     Status: None   Collection Time: 04/17/20 11:01 PM   Specimen: Nasopharyngeal Swab; Nasopharyngeal(NP) swabs in vial transport medium  Result Value Ref Range Status   SARS Coronavirus 2 by RT PCR NEGATIVE NEGATIVE Final    Comment: (NOTE) SARS-CoV-2 target nucleic acids are NOT DETECTED.  The SARS-CoV-2 RNA is generally detectable in upper respiratory specimens during the acute phase of infection. The lowest concentration of SARS-CoV-2 viral copies this assay can detect is 138 copies/mL. A negative result does not preclude SARS-Cov-2 infection and should not be used as the sole basis for treatment or other patient management decisions. A negative result may  occur with  improper specimen collection/handling, submission of specimen other than nasopharyngeal swab, presence of viral mutation(s) within the areas targeted by this assay, and inadequate number of viral copies(<138 copies/mL). A negative result must be combined with clinical observations, patient history, and epidemiological information. The expected result is Negative.  Fact Sheet for Patients:  14/07/21  Fact Sheet for Healthcare Providers:  BloggerCourse.com  This test is no t yet approved or cleared by the [employer] FDA and  has been authorized for detection and/or diagnosis of SARS-CoV-2 by FDA under an Emergency Use Authorization (EUA). This EUA will remain  in effect (meaning this test can be used) for the duration of the COVID-19 declaration under Section 564(b)(1) of the Act, 21 U.S.C.section 360bbb-3(b)(1), unless the authorization is terminated  or revoked sooner.       Influenza A by PCR NEGATIVE NEGATIVE Final   Influenza B by PCR NEGATIVE NEGATIVE Final    Comment: (NOTE) The Xpert Xpress SARS-CoV-2/FLU/RSV plus assay is intended as an aid in the diagnosis of influenza from Nasopharyngeal swab specimens and should not be used as a sole basis for treatment. Nasal washings and aspirates are unacceptable for Xpert Xpress SARS-CoV-2/FLU/RSV testing.  Fact Sheet for Patients: BloggerCourse.com  Fact Sheet for Healthcare Providers: [employer]ious[employer]ker[employer]it  This test is not yet approved or cleared by the [place] FDA and has been authorized for detection and/or diagnosis of SARS-CoV-2 by FDA under an Emergency Use Authorization (EUA). This EUA will remain in effect (meaning this test can be used) for the duration of the COVID-19 declaration under Section 564(b)(1) of the Act, 21 U.S.C. section 360bbb-3(b)(1), unless the authorization is terminated  or revoked.  Performed at [facility], [address] [place], [place] [address]       Studies: CT HEAD WO CONTRAST  Result Date: 04/17/2020 CLINICAL DATA:  Numbness and vibrating sensation over LEFT side of body, 2 minutes episode approximately 45 minutes prior to presentation, symptoms resolved, has dull headache and LEFT peripheral vision disturbance; no history of stroke EXAM: CT HEAD WITHOUT CONTRAST TECHNIQUE: Contiguous axial images were obtained from the base of the skull through the vertex without intravenous contrast. Sagittal and coronal MPR images reconstructed from axial data set. COMPARISON:  None FINDINGS: Brain: Normal ventricular morphology. No midline shift or mass effect. Normal appearance of brain parenchyma. No intracranial hemorrhage, mass lesion or evidence of acute infarction. No extra-axial fluid collections. Vascular: No hyperdense vessels Skull: Intact Sinuses/Orbits: Clear Other: N/A IMPRESSION: Normal exam. Electronically Signed   By: Ulyses Southward M.D.   On: 04/17/2020 18:21   MR ANGIO HEAD WO CONTRAST  Result Date: 04/17/2020 CLINICAL DATA:  Initial evaluation for acute neuro deficit, acute stroke suspected. Transient numbness and vibratory sensation involving the left-sided body. Headache. EXAM: MRI HEAD WITHOUT CONTRAST MRA HEAD WITHOUT CONTRAST MRA NECK WITHOUT AND WITH CONTRAST TECHNIQUE: Multiplanar, multiecho pulse sequences of the brain and surrounding structures were obtained without intravenous contrast. Angiographic images of the Circle of Willis were obtained using MRA technique without intravenous contrast. Angiographic images of the neck were obtained using MRA technique without and with intravenous contrast. Carotid stenosis measurements (when applicable) are obtained utilizing NASCET criteria, using the distal internal carotid diameter as the denominator. CONTRAST:  5.31mL GADAVIST GADOBUTROL 1 MMOL/ML IV SOLN COMPARISON:  Prior head CT from earlier  the same day. FINDINGS: MRI HEAD FINDINGS Brain: Cerebral volume within normal limits for patient age. Mild patchy T2/FLAIR hyperintensity seen within the periventricular white matter and pons, most likely related chronic small vessel ischemic disease, mild in nature. Probable small remote lacunar infarct noted at the left midbrain (series 16, image 23). 6 mm focus of restricted diffusion seen involving the cortical gray matter of the right occipital lobe, consistent with an acute ischemic infarct (series 5, image 73), right PCA distribution. No associated hemorrhage or mass effect. No other diffusion abnormality to suggest acute or subacute ischemia. Gray-white matter differentiation otherwise maintained. No encephalomalacia to suggest chronic cortical infarction elsewhere within the brain. No foci of susceptibility artifact to suggest acute or chronic intracranial hemorrhage. No mass lesion, midline shift or mass effect. No hydrocephalus. No extra-axial fluid collection. Major dural sinuses are grossly patent. Pituitary gland and suprasellar region are normal. Midline structures intact and normal.  Vascular: Major intracranial vascular flow voids well maintained and normal in appearance. Skull and upper cervical spine: Craniocervical junction normal. Visualized upper cervical spine within normal limits. Bone marrow signal intensity normal. No scalp soft tissue abnormality. Sinuses/Orbits: Globes and orbital soft tissues within normal limits. Minimal mucosal thickening noted within the ethmoidal air cells. Paranasal sinuses are otherwise clear. No mastoid effusion. Inner ear structures grossly normal. Other: None. MRA HEAD FINDINGS ANTERIOR CIRCULATION: Visualized distal cervical segments of the internal carotid arteries are patent with symmetric antegrade flow. Petrous, cavernous, and supraclinoid segments widely patent without stenosis or other abnormality. A1 segments patent bilaterally. Normal anterior  communicating artery complex. Anterior cerebral arteries patent to their distal aspects without stenosis. No M1 stenosis or occlusion. Normal MCA bifurcations. Distal MCA branches well perfused and symmetric. POSTERIOR CIRCULATION: Dominant left V4 segment widely patent without stenosis. Patent left PICA origin. Right V4 segment diffusely hypoplastic but patent as well. Right PICA not seen. Basilar patent to its distal aspect without stenosis. Superior cerebral arteries patent bilaterally. Both PCAs primarily supplied via the basilar. Left PCA widely patent and well perfused to its distal aspect. There is apparent occlusion of the proximal right P2 segment (series 1054, image 12). No significant flow within the right PCA distribution distally evident by MRA. No intracranial aneurysm. MRA NECK FINDINGS AORTIC ARCH: Visualized aortic arch of normal caliber with normal 3 vessel morphology. No hemodynamically significant stenosis seen about the origin of the great vessels. RIGHT CAROTID SYSTEM: Right common and internal carotid arteries widely patent without stenosis, evidence for dissection, or occlusion. No significant atheromatous irregularity or narrowing about the right carotid bifurcation. LEFT CAROTID SYSTEM: Left common and internal carotid arteries widely patent without stenosis, evidence for dissection or occlusion. No significant atheromatous irregularity or narrowing about the left carotid bifurcation. VERTEBRAL ARTERIES: Both vertebral arteries arise from the subclavian arteries. Strongly dominant left vertebral artery with a diffusely hypoplastic right vertebral artery. Vertebral arteries widely patent within the neck without stenosis, evidence for dissection, or occlusion. IMPRESSION: MRI HEAD IMPRESSION: 1. 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 2. Underlying mild chronic microvascular ischemic disease for age. MRA HEAD IMPRESSION:  1. Proximal right P2 occlusion, consistent with the acute right PCA territory infarct. There may be an underlying chronic stenosis at this location given the relative small size of the infarct. 2. Otherwise normal intracranial MRA. No other large vessel occlusion, hemodynamically significant stenosis, or other acute vascular abnormality. MRA NECK IMPRESSION: Normal MRA of the neck. Electronically Signed   By: Rise Mu M.D.   On: 04/17/2020 22:37   MR Angiogram Neck W or Wo Contrast  Result Date: 04/17/2020 CLINICAL DATA:  Initial evaluation for acute neuro deficit, acute stroke suspected. Transient numbness and vibratory sensation involving the left-sided body. Headache. EXAM: MRI HEAD WITHOUT CONTRAST MRA HEAD WITHOUT CONTRAST MRA NECK WITHOUT AND WITH CONTRAST TECHNIQUE: Multiplanar, multiecho pulse sequences of the brain and surrounding structures were obtained without intravenous contrast. Angiographic images of the Circle of Willis were obtained using MRA technique without intravenous contrast. Angiographic images of the neck were obtained using MRA technique without and with intravenous contrast. Carotid stenosis measurements (when applicable) are obtained utilizing NASCET criteria, using the distal internal carotid diameter as the denominator. CONTRAST:  5.50mL GADAVIST GADOBUTROL 1 MMOL/ML IV SOLN COMPARISON:  Prior head CT from earlier the same day. FINDINGS: MRI HEAD FINDINGS Brain: Cerebral volume within normal limits for patient age. Mild patchy T2/FLAIR  hyperintensity seen within the periventricular white matter and pons, most likely related chronic small vessel ischemic disease, mild in nature. Probable small remote lacunar infarct noted at the left midbrain (series 16, image 23). 6 mm focus of restricted diffusion seen involving the cortical gray matter of the right occipital lobe, consistent with an acute ischemic infarct (series 5, image 73), right PCA distribution. No associated  hemorrhage or mass effect. No other diffusion abnormality to suggest acute or subacute ischemia. Gray-white matter differentiation otherwise maintained. No encephalomalacia to suggest chronic cortical infarction elsewhere within the brain. No foci of susceptibility artifact to suggest acute or chronic intracranial hemorrhage. No mass lesion, midline shift or mass effect. No hydrocephalus. No extra-axial fluid collection. Major dural sinuses are grossly patent. Pituitary gland and suprasellar region are normal. Midline structures intact and normal. Vascular: Major intracranial vascular flow voids well maintained and normal in appearance. Skull and upper cervical spine: Craniocervical junction normal. Visualized upper cervical spine within normal limits. Bone marrow signal intensity normal. No scalp soft tissue abnormality. Sinuses/Orbits: Globes and orbital soft tissues within normal limits. Minimal mucosal thickening noted within the ethmoidal air cells. Paranasal sinuses are otherwise clear. No mastoid effusion. Inner ear structures grossly normal. Other: None. MRA HEAD FINDINGS ANTERIOR CIRCULATION: Visualized distal cervical segments of the internal carotid arteries are patent with symmetric antegrade flow. Petrous, cavernous, and supraclinoid segments widely patent without stenosis or other abnormality. A1 segments patent bilaterally. Normal anterior communicating artery complex. Anterior cerebral arteries patent to their distal aspects without stenosis. No M1 stenosis or occlusion. Normal MCA bifurcations. Distal MCA branches well perfused and symmetric. POSTERIOR CIRCULATION: Dominant left V4 segment widely patent without stenosis. Patent left PICA origin. Right V4 segment diffusely hypoplastic but patent as well. Right PICA not seen. Basilar patent to its distal aspect without stenosis. Superior cerebral arteries patent bilaterally. Both PCAs primarily supplied via the basilar. Left PCA widely patent and well  perfused to its distal aspect. There is apparent occlusion of the proximal right P2 segment (series 1054, image 12). No significant flow within the right PCA distribution distally evident by MRA. No intracranial aneurysm. MRA NECK FINDINGS AORTIC ARCH: Visualized aortic arch of normal caliber with normal 3 vessel morphology. No hemodynamically significant stenosis seen about the origin of the great vessels. RIGHT CAROTID SYSTEM: Right common and internal carotid arteries widely patent without stenosis, evidence for dissection, or occlusion. No significant atheromatous irregularity or narrowing about the right carotid bifurcation. LEFT CAROTID SYSTEM: Left common and internal carotid arteries widely patent without stenosis, evidence for dissection or occlusion. No significant atheromatous irregularity or narrowing about the left carotid bifurcation. VERTEBRAL ARTERIES: Both vertebral arteries arise from the subclavian arteries. Strongly dominant left vertebral artery with a diffusely hypoplastic right vertebral artery. Vertebral arteries widely patent within the neck without stenosis, evidence for dissection, or occlusion. IMPRESSION: MRI HEAD IMPRESSION: 1. 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 2. Underlying mild chronic microvascular ischemic disease for age. MRA HEAD IMPRESSION: 1. Proximal right P2 occlusion, consistent with the acute right PCA territory infarct. There may be an underlying chronic stenosis at this location given the relative small size of the infarct. 2. Otherwise normal intracranial MRA. No other large vessel occlusion, hemodynamically significant stenosis, or other acute vascular abnormality. MRA NECK IMPRESSION: Normal MRA of the neck. Electronically Signed   By: Rise Mu M.D.   On: 04/17/2020 22:37   MR BRAIN WO CONTRAST  Result Date: 04/17/2020  CLINICAL DATA:  Initial evaluation for acute neuro deficit,  acute stroke suspected. Transient numbness and vibratory sensation involving the left-sided body. Headache. EXAM: MRI HEAD WITHOUT CONTRAST MRA HEAD WITHOUT CONTRAST MRA NECK WITHOUT AND WITH CONTRAST TECHNIQUE: Multiplanar, multiecho pulse sequences of the brain and surrounding structures were obtained without intravenous contrast. Angiographic images of the Circle of Willis were obtained using MRA technique without intravenous contrast. Angiographic images of the neck were obtained using MRA technique without and with intravenous contrast. Carotid stenosis measurements (when applicable) are obtained utilizing NASCET criteria, using the distal internal carotid diameter as the denominator. CONTRAST:  5.89mL GADAVIST GADOBUTROL 1 MMOL/ML IV SOLN COMPARISON:  Prior head CT from earlier the same day. FINDINGS: MRI HEAD FINDINGS Brain: Cerebral volume within normal limits for patient age. Mild patchy T2/FLAIR hyperintensity seen within the periventricular white matter and pons, most likely related chronic small vessel ischemic disease, mild in nature. Probable small remote lacunar infarct noted at the left midbrain (series 16, image 23). 6 mm focus of restricted diffusion seen involving the cortical gray matter of the right occipital lobe, consistent with an acute ischemic infarct (series 5, image 73), right PCA distribution. No associated hemorrhage or mass effect. No other diffusion abnormality to suggest acute or subacute ischemia. Gray-white matter differentiation otherwise maintained. No encephalomalacia to suggest chronic cortical infarction elsewhere within the brain. No foci of susceptibility artifact to suggest acute or chronic intracranial hemorrhage. No mass lesion, midline shift or mass effect. No hydrocephalus. No extra-axial fluid collection. Major dural sinuses are grossly patent. Pituitary gland and suprasellar region are normal. Midline structures intact and normal. Vascular: Major intracranial vascular  flow voids well maintained and normal in appearance. Skull and upper cervical spine: Craniocervical junction normal. Visualized upper cervical spine within normal limits. Bone marrow signal intensity normal. No scalp soft tissue abnormality. Sinuses/Orbits: Globes and orbital soft tissues within normal limits. Minimal mucosal thickening noted within the ethmoidal air cells. Paranasal sinuses are otherwise clear. No mastoid effusion. Inner ear structures grossly normal. Other: None. MRA HEAD FINDINGS ANTERIOR CIRCULATION: Visualized distal cervical segments of the internal carotid arteries are patent with symmetric antegrade flow. Petrous, cavernous, and supraclinoid segments widely patent without stenosis or other abnormality. A1 segments patent bilaterally. Normal anterior communicating artery complex. Anterior cerebral arteries patent to their distal aspects without stenosis. No M1 stenosis or occlusion. Normal MCA bifurcations. Distal MCA branches well perfused and symmetric. POSTERIOR CIRCULATION: Dominant left V4 segment widely patent without stenosis. Patent left PICA origin. Right V4 segment diffusely hypoplastic but patent as well. Right PICA not seen. Basilar patent to its distal aspect without stenosis. Superior cerebral arteries patent bilaterally. Both PCAs primarily supplied via the basilar. Left PCA widely patent and well perfused to its distal aspect. There is apparent occlusion of the proximal right P2 segment (series 1054, image 12). No significant flow within the right PCA distribution distally evident by MRA. No intracranial aneurysm. MRA NECK FINDINGS AORTIC ARCH: Visualized aortic arch of normal caliber with normal 3 vessel morphology. No hemodynamically significant stenosis seen about the origin of the great vessels. RIGHT CAROTID SYSTEM: Right common and internal carotid arteries widely patent without stenosis, evidence for dissection, or occlusion. No significant atheromatous irregularity or  narrowing about the right carotid bifurcation. LEFT CAROTID SYSTEM: Left common and internal carotid arteries widely patent without stenosis, evidence for dissection or occlusion. No significant atheromatous irregularity or narrowing about the left carotid bifurcation. VERTEBRAL ARTERIES: Both vertebral arteries arise from the subclavian arteries. Strongly  dominant left vertebral artery with a diffusely hypoplastic right vertebral artery. Vertebral arteries widely patent within the neck without stenosis, evidence for dissection, or occlusion. IMPRESSION: MRI HEAD IMPRESSION: 1. 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 2. Underlying mild chronic microvascular ischemic disease for age. MRA HEAD IMPRESSION: 1. Proximal right P2 occlusion, consistent with the acute right PCA territory infarct. There may be an underlying chronic stenosis at this location given the relative small size of the infarct. 2. Otherwise normal intracranial MRA. No other large vessel occlusion, hemodynamically significant stenosis, or other acute vascular abnormality. MRA NECK IMPRESSION: Normal MRA of the neck. Electronically Signed   By: Rise Mu M.D.   On: 04/17/2020 22:37    Scheduled Meds: . aspirin  300 mg Rectal Daily   Or  . aspirin  325 mg Oral Daily  . atorvastatin  40 mg Oral Daily  . enoxaparin (LOVENOX) injection  40 mg Subcutaneous Q24H    Continuous Infusions:   LOS: 0 days     Briant Cedar, MD [facility]  If 7PM-7AM, please contact night-coverage [email or web address] 04/18/2020, 2:09 PM

## 2020-04-18 NOTE — Progress Notes (Signed)
OT Cancellation Note  Patient Details Name: Vicki Stevens MRN: [id number] DOB: 1956-03-31   Cancelled Treatment:    Reason Eval/Treat Not Completed: OT screened, no needs identified, will sign off  Janitza Revuelta,HILLARY 04/18/2020, 10:04 AM  Luisa Dago, OT/L   Acute OT Clinical Specialist Acute Rehabilitation Services Pager [phone] Office [phone]

## 2020-04-18 NOTE — Progress Notes (Addendum)
TCD bubble study and bilateral lower extremity venous duplex completed. Refer to "CV Proc" under chart review to view preliminary results.  04/18/2020 3:45 PM Eula Fried., MHA, RVT, RDCS, RDMS

## 2020-04-18 NOTE — Progress Notes (Signed)
  Echocardiogram 2D Echocardiogram has been performed.  Vicki Stevens Vicki Stevens 04/18/2020, 3:09 PM

## 2020-04-18 NOTE — Progress Notes (Signed)
STROKE TEAM PROGRESS NOTE   HISTORY OF PRESENT ILLNESS (per record) Vicki Stevens is a 64 y.o. female which is physically fit and borderline HTN seen for episode of left-sided numbness followed by vibration sensation lasted ~2 minutes 04/17/2020.She adds that ~1 week of waxing and waning headache and 4 episodes of intermittent  left peripheral visual changes which lasted ~1 minute and resolved completely.  After the episode of left-sided numbness and vibration sensation she developed dull headache and she called her PCP who prompted her to present to the ED. She runs 8K per day, is very active and in good health. She reports a feeling in her chest described as an unusually strong heartbeat which happens randomly without provocation even while sitting quietly. Denies fever, cough, chest pain, shortness of breath, abdominal pain, nausea, constipation, diarrhea. LKW: 5 days ago prior to episodes. tpa given?: No, OSW IR Thrombectomy? Not candidate Modified Rankin Scale: 0-Completely asymptomatic and back to baseline post- stroke NIHSS: 0   INTERVAL HISTORY I have personally reviewed history of presenting illness with the patient, electronic medical records and imaging films in PACS.  She presented with recurrent episodes of transient blurred vision for a few days and then 1 brief episode of left hemibody numbness yesterday.  MRI scan shows embolic right PCA small infarct with MR angiogram showing right P2/P3 occlusion.  He has no significant vascular risk factors except borderline hypertension.  She has no prior history of DVT, pulmonary embolism, recurrent miscarriages or family history of strokes or heart attacks at a young age.    OBJECTIVE Vitals:   04/18/20 0145 04/18/20 0200 04/18/20 0245 04/18/20 0515  BP: (!) 197/83 (!) 195/92 (!) 187/95 (!) 154/91  Pulse: (!) 34 63 63 (!) 55  Resp: 18 17 15 17   Temp:      TempSrc:      SpO2: 95% 97% 95% 96%    CBC:  Recent Labs  Lab  04/17/20 1703 04/17/20 1724  WBC 7.7  --   NEUTROABS 5.2  --   HGB 14.4 14.3  HCT 42.3 42.0  MCV 92.6  --   PLT 292  --     Basic Metabolic Panel:  Recent Labs  Lab 04/17/20 1703 04/17/20 1724  NA 140 141  K 3.8 3.8  CL 102 103  CO2 29  --   GLUCOSE 113* 110*  BUN 18 20  CREATININE 0.59 0.50  CALCIUM 9.4  --     Lipid Panel:     Component Value Date/Time   CHOL 210 (H) 06/12/2014 1525   TRIG 122.0 06/12/2014 1525   HDL 63.60 06/12/2014 1525   CHOLHDL 3 06/12/2014 1525   VLDL 24.4 06/12/2014 1525   LDLCALC 122 (H) 06/12/2014 1525   HgbA1c: No results found for: HGBA1C Urine Drug Screen: No results found for: LABOPIA, COCAINSCRNUR, LABBENZ, AMPHETMU, THCU, LABBARB  Alcohol Level No results found for: ETH  IMAGING  CT HEAD WO CONTRAST 04/17/2020 IMPRESSION:  Normal exam.   MR BRAIN WO CONTRAST MR ANGIO HEAD WO CONTRAST MR Angiogram Neck W or Wo Contrast 04/17/2020  MRI HEAD  IMPRESSION:  1. 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  2. Underlying mild chronic microvascular ischemic disease for age.   MRA HEAD  IMPRESSION:  1. Proximal right P2 occlusion, consistent with the acute right PCA territory infarct. There may be an underlying chronic stenosis at this location given the relative small size of the  infarct.  2. Otherwise normal intracranial MRA. No other large vessel occlusion, hemodynamically significant stenosis, or other acute vascular abnormality.   MRA NECK  IMPRESSION:  Normal MRA of the neck.   Transthoracic Echocardiogram  00/00/2021 Pending  ECG - SR rate 64 BPM. (See cardiology reading for complete details)  PHYSICAL EXAM Blood pressure (!) 154/91, pulse (!) 55, temperature 98.2 F (36.8 C), temperature source Oral, resp. rate 17, SpO2 96 %. Pleasant middle-age Caucasian lady not in distress. . Afebrile. Head is nontraumatic. Neck is supple without bruit.     Cardiac exam no murmur or gallop. Lungs are clear to auscultation. Distal pulses are well felt. Neurological Exam ;  Awake  Alert oriented x 3. Normal speech and language.eye movements full without nystagmus.fundi were not visualized. Vision acuity and fields appear normal. Hearing is normal. Palatal movements are normal. Face symmetric. Tongue midline. Normal strength, tone, reflexes and coordination. Normal sensation. Gait deferred.     ASSESSMENT/PLAN [name] is a 64 y.o. female who is physically fit with borderline HTN and remote hx of MVP presenting with an episode of left-sided numbness followed by vibration sensation which lasted ~2 minutes 04/17/2020 as well as ~1 week of waxing and waning headache and 4 episodes of intermittent  left peripheral visual changes which lasted ~1 minute and resolved completely. Also of note pt reports episodes of unusually strong heartbeat which happens randomly without provocation even while sitting quietly. She did not receive IV t-PA due to NIHSS - 0 and late presentation (>4.5 hours from time of onset).   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  Resultant no deficits   code Stroke CT Head - not ordered  CT head - Normal exam.  MRI head - 6 mm acute ischemic nonhemorrhagic cortical infarct involving the right occipital lobe, right PCA distribution. Underlying mild chronic microvascular ischemic disease for age.   MRA head - Proximal right P2 occlusion, consistent with the acute right PCA territory infarct. There may be an underlying chronic stenosis at this location given the relative small size of the infarct.   MRA neck - Normal  CTA H&N - not ordered  CT Perfusion - not ordered  Carotid Doppler - MRA neck ordered - carotid dopplers not indicated  2D Echo - pending  Sars Corona Virus 2 - negative  LDL - pending  HgbA1c -  pending  UDS - not ordered  VTE prophylaxis - Lovenox 40 mg daily Diet  Diet Order            Diet NPO time specified  Diet effective now                 No antithrombotic prior to admission, now on aspirin 300 mg suppository daily  Patient counseled to be compliant with her antithrombotic medications  Ongoing aggressive stroke risk factor management  Therapy recommendations:  pending  Disposition:  Pending  Hypertension  Home BP meds: Lisinopril  Current BP meds: none   Blood pressure somewhat high at times but within post stroke/TIA parameters . Permissive hypertension (OK if < 220/120) but gradually normalize in 5-7 days  . Long-term BP goal normotensive  Hyperlipidemia  Home Lipid lowering medication: none   LDL pending, goal < 70  Current lipid lowering medication: Lipitor 40 mg daily   Continue statin at discharge  Other Stroke Risk Factors  Advanced age  ETOH use, advised to  drink no more than 1 alcoholic beverage per day.  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  Recommend continue ongoing stroke work-up check echocardiogram and if negative TEE and loop recorder tomorrow.  Vasculitic and hypercoagulable labs.  TCD bubble study for PFO.  Aspirin Plavix for 3 weeks followed by aspirin alone.  Long discussion the patient and answered questions. Discussed with Dr. Sharolyn Douglas.  Greater than 50% time during the 35-minute visit was spent on counseling and coordination of care about her embolic stroke and answering questions and discussion with care team. Delia Heady, MD To contact Stroke Continuity provider, please refer to [email or web address]. After hours, contact General Neurology

## 2020-04-18 NOTE — Consult Note (Signed)
Neurology Consult H&P  CC: left sided numbness  History is obtained from: patient  HPI: Vicki Stevens is a 64 y.o. female which is physically fit and borderline HTN seen for episode of left-sided numbness followed by vibration sensation lasted ~2 minutes 04/17/2020. She adds that ~1 week of waxing and waning headache and 4 episodes of intermittent  left peripheral visual changes which lasted ~1 minute and resolved completely.  After the episode of left-sided numbness and vibration sensation she developed dull headache and she called her PCP who prompted her to present to the ED.   She runs 8K per day, is very active and in good health.  She reports a feeling in her chest described as an unusually strong heartbeat which happens randomly without provocation even while sitting quietly.  Denies fever, cough, chest pain, shortness of breath, abdominal pain, nausea, constipation, diarrhea.  LKW: 5 days ago prior to episodes. tpa given?: No, OSW IR Thrombectomy? Not candidate Modified Rankin Scale: 0-Completely asymptomatic and back to baseline post- stroke NIHSS: 0  ROS: A complete ROS was performed and is negative except as noted in the HPI.   Past Medical History:  Diagnosis Date  . H/O hematuria remote   dr Vonita Moss felt to be from running   . Heart murmur    evaluation ? mvp when younger  . Hx of varicella   . LOW BACK PAIN, ACUTE 03/05/2010    Family History  Problem Relation Age of Onset  . Lung cancer Father        108  . Other Mother        MVA 56     Social History:  reports that she has never smoked. She has never used smokeless tobacco. She reports current alcohol use. No history on file for drug use.   Prior to Admission medications   Medication Sig Start Date End Date Taking? Authorizing Provider  Cyanocobalamin 2500 MCG TABS Take by mouth.    [provider]  lisinopril (PRINIVIL,ZESTRIL) 10 MG tablet Take 1 tablet (10 mg total) by mouth daily. 06/30/16    Panosh, [name], MD  Multiple Vitamins-Minerals (ADULT GUMMY PO) Take by mouth.    [provider]  Pseudoephedrine-Acetaminophen (SM NON-ASPRIN SINUS PO) Take by mouth.    [provider]   Exam: Current vital signs: BP (!) 187/95   Pulse 63   Temp 98.2 F (36.8 C) (Oral)   Resp 15   SpO2 95%   Physical Exam  Constitutional: Appears well-developed and well-nourished.  Psych: Affect appropriate to situation Eyes: No scleral injection HENT: No OP obstrucion Head: Normocephalic.  Cardiovascular: Normal rate and regular rhythm.  Respiratory: Effort normal and breath sounds normal to anterior ascultation GI: Soft.  No distension. There is no tenderness.  Skin: WDI  Neuro: Mental Status: Patient is awake, alert, oriented to person, place, month, year, and situation. Patient is able to give a clear and coherent history. No signs of aphasia or neglect. Cranial Nerves: II: Visual Fields are full. Pupils are equal, round, and reactive to light. III,IV, VI: EOMI without ptosis or diploplia.  V: Facial sensation is symmetric to temperature VII: Facial movement is symmetric.  VIII: hearing is intact to voice X: Uvula elevates symmetrically XI: Shoulder shrug is symmetric. XII: tongue is midline without atrophy or fasciculations.  Motor: Tone is normal. Bulk is normal. 5/5 strength was present in all four extremities. Sensory: Sensation is symmetric to light touch and temperature in the arms and  legs. Deep Tendon Reflexes: 2+ and symmetric in the biceps and patellae. Plantars: Toes are downgoing bilaterally. Cerebellar: FNF and HKS are intact bilaterally.  I have reviewed the images obtained: MRI brain 6 mm acute ischemic nonhemorrhagic cortical infarct involving the right occipital lobe, right PCA distribution. MRA head and neck showed proximal right P2 occlusion, consistent with the acute right PCA territory infarct. There may be an underlying chronic  stenosis at this location given the relative small size of the infarct.  Assessment: Vicki Stevens is a 64 y.o. female physically fit runs 8K per day no vascular risk factors with acute small right PCA stroke.   Recommended aspirin 324mg  now.  Plan: - Recommend TTE. - Recommend labs: HbA1c, lipid panel, TSH. - Recommend Statin if LDL > 70 - Aspirin 81mg  daily. - Permissive hypertension first 24 h < 220/110.  - Telemetry monitoring for arrhythmia. - Recommend bedside Swallow screen. - Recommend Stroke education. - Recommend PT/OT/SLP consult.  Electronically signed by: Dr. Pager: [phone] 04/18/2020, 4:27 AM

## 2020-04-18 NOTE — Evaluation (Signed)
Physical Therapy Evaluation and Discharge Patient Details Name: PEG FIFER MRN: [id number] DOB: [birth year] Today's Date: 04/18/2020   History of Present Illness  Pt is a 64 y/o female admitted secondary to L sided numbness and tingling. Pt reports symptoms have since resolved. Found to have R PCA and occipital infarct. PMH includes HTN.   Clinical Impression  Patient evaluated by Physical Therapy with no further acute PT needs identified. All education has been completed and the patient has no further questions. Pt overall steady with mobility tasks. Able to perform DGI tasks without LOB. Scored 24 on DGI indicating low fall risk. Educated about "BE FAST" symptoms when recognizing CVA symptoms.  See below for any follow-up Physical Therapy or equipment needs. PT is signing off. Thank you for this referral. If needs change, please re-consult.      Follow Up Recommendations No PT follow up    Equipment Recommendations  None recommended by PT    Recommendations for Other Services       Precautions / Restrictions Precautions Precautions: None Restrictions Weight Bearing Restrictions: No      Mobility  Bed Mobility Overal bed mobility: Independent                  Transfers Overall transfer level: Independent                  Ambulation/Gait Ambulation/Gait assistance: Independent Gait Distance (Feet): 200 Feet Assistive device: None Gait Pattern/deviations: WFL(Within Functional Limits) Gait velocity: WFL    General Gait Details: Able to perform dynamic gait tasks without LOB. Good gait speed  Stairs            Wheelchair Mobility    Modified Rankin (Stroke Patients Only) Modified Rankin (Stroke Patients Only) Pre-Morbid Rankin Score: No symptoms Modified Rankin: No symptoms     Balance Overall balance assessment: Independent                               Standardized Balance Assessment Standardized Balance Assessment :  Dynamic Gait Index   Dynamic Gait Index Level Surface: Normal Change in Gait Speed: Normal Gait with Horizontal Head Turns: Normal Gait with Vertical Head Turns: Normal Gait and Pivot Turn: Normal Step Over Obstacle: Normal Step Around Obstacles: Normal Steps: Normal Total Score: 24       Pertinent Vitals/Pain Pain Assessment: Faces Faces Pain Scale: Hurts a little bit Pain Location: headache Pain Descriptors / Indicators: Headache Pain Intervention(s): Limited activity within patient's tolerance;Monitored during session;Repositioned    Home Living Family/patient expects to be discharged to:: Private residence Living Arrangements: Spouse/significant other Available Help at Discharge: Family Type of Home: House Home Access: Stairs to enter Entrance Stairs-Rails: None Secretary/administrator of Steps: 1 Home Layout: One level Home Equipment: None      Prior Function Level of Independence: Independent         Comments: Very active; runs daily     Hand Dominance        Extremity/Trunk Assessment   Upper Extremity Assessment Upper Extremity Assessment: Overall WFL for tasks assessed    Lower Extremity Assessment Lower Extremity Assessment: Overall WFL for tasks assessed    Cervical / Trunk Assessment Cervical / Trunk Assessment: Normal  Communication   Communication: No difficulties  Cognition Arousal/Alertness: Awake/alert Behavior During Therapy: WFL for tasks assessed/performed Overall Cognitive Status: Within Functional Limits for tasks assessed  General Comments General comments (skin integrity, edema, etc.): Educated about "BE FAST" Acronym in recognizing CVA symptoms.     Exercises     Assessment/Plan    PT Assessment Patent does not need any further PT services  PT Problem List         PT Treatment Interventions      PT Goals (Current goals can be found in the Care Plan section)   Acute Rehab PT Goals Patient Stated Goal: to go home PT Goal Formulation: All assessment and education complete, DC therapy Time For Goal Achievement: 04/18/20 Potential to Achieve Goals: Good    Frequency     Barriers to discharge        Co-evaluation               AM-PAC PT "6 Clicks" Mobility  Outcome Measure Help needed turning from your back to your side while in a flat bed without using bedrails?: None Help needed moving from lying on your back to sitting on the side of a flat bed without using bedrails?: None Help needed moving to and from a bed to a chair (including a wheelchair)?: None Help needed standing up from a chair using your arms (e.g., wheelchair or bedside chair)?: None Help needed to walk in hospital room?: None Help needed climbing 3-5 steps with a railing? : None 6 Click Score: 24    End of Session   Activity Tolerance: Patient tolerated treatment well Patient left: in bed;with call bell/phone within reach;with family/visitor present (on stretcher in ED ) Nurse Communication: Mobility status PT Visit Diagnosis: Other symptoms and signs involving the nervous system (U31.497)    Time: 0263-7858 PT Time Calculation (min) (ACUTE ONLY): 12 min   Charges:   PT Evaluation $PT Eval Low Complexity: 1 Low          Cindee Salt, DPT  Acute Rehabilitation Services  Pager: [phone] Office: [phone]   Lehman Prom 04/18/2020, 10:24 AM

## 2020-04-18 NOTE — ED Notes (Signed)
Hooked patient back up to the monitor patient is resting with call bell in reach and family at bedside 

## 2020-04-19 ENCOUNTER — Observation Stay (HOSPITAL_BASED_OUTPATIENT_CLINIC_OR_DEPARTMENT_OTHER): Payer: 59

## 2020-04-19 ENCOUNTER — Observation Stay (HOSPITAL_COMMUNITY): Payer: 59 | Admitting: Anesthesiology

## 2020-04-19 ENCOUNTER — Encounter (HOSPITAL_COMMUNITY): Payer: Self-pay | Admitting: Internal Medicine

## 2020-04-19 ENCOUNTER — Encounter (HOSPITAL_COMMUNITY)
Admission: EM | Disposition: A | Discharge status: Home / Self Care | Payer: Self-pay | Source: Home / Self Care | Attending: Emergency Medicine

## 2020-04-19 DIAGNOSIS — I10 Essential (primary) hypertension: Secondary | ICD-10-CM | POA: Diagnosis not present

## 2020-04-19 DIAGNOSIS — E785 Hyperlipidemia, unspecified: Secondary | ICD-10-CM

## 2020-04-19 DIAGNOSIS — Z20822 Contact with and (suspected) exposure to covid-19: Secondary | ICD-10-CM | POA: Diagnosis not present

## 2020-04-19 DIAGNOSIS — I313 Pericardial effusion (noninflammatory): Secondary | ICD-10-CM | POA: Diagnosis not present

## 2020-04-19 DIAGNOSIS — I6389 Other cerebral infarction: Secondary | ICD-10-CM

## 2020-04-19 DIAGNOSIS — I639 Cerebral infarction, unspecified: Secondary | ICD-10-CM | POA: Diagnosis not present

## 2020-04-19 DIAGNOSIS — I6300 Cerebral infarction due to thrombosis of unspecified precerebral artery: Secondary | ICD-10-CM | POA: Diagnosis not present

## 2020-04-19 HISTORY — PX: BUBBLE STUDY: SHX6837

## 2020-04-19 HISTORY — PX: TEE WITHOUT CARDIOVERSION: SHX5443

## 2020-04-19 HISTORY — PX: LOOP RECORDER INSERTION: EP1214

## 2020-04-19 LAB — BASIC METABOLIC PANEL
Anion gap: 8 (ref 5–15)
BUN: 11 mg/dL (ref 8–23)
CO2: 28 mmol/L (ref 22–32)
Calcium: 9.2 mg/dL (ref 8.9–10.3)
Chloride: 105 mmol/L (ref 98–111)
Creatinine, Ser: 0.56 mg/dL (ref 0.44–1.00)
GFR, Estimated: >60 mL/min (ref >60)
Glucose, Bld: 122 mg/dL — ABNORMAL HIGH (ref 70–99)
Potassium: 4.1 mmol/L (ref 3.5–5.1)
Sodium: 141 mmol/L (ref 135–145)

## 2020-04-19 LAB — CBC WITH DIFFERENTIAL/PLATELET
Abs Immature Granulocytes: 0.01 10*3/uL (ref 0.00–0.07)
Basophils Absolute: 0 10*3/uL (ref 0.0–0.1)
Basophils Relative: 1 %
Eosinophils Absolute: 0.3 10*3/uL (ref 0.0–0.5)
Eosinophils Relative: 5 %
HCT: 43.4 % (ref 36.0–46.0)
Hemoglobin: 14.6 g/dL (ref 12.0–15.0)
Immature Granulocytes: 0 %
Lymphocytes Relative: 26 %
Lymphs Abs: 1.4 10*3/uL (ref 0.7–4.0)
MCH: 30.9 pg (ref 26.0–34.0)
MCHC: 33.6 g/dL (ref 30.0–36.0)
MCV: 91.9 fL (ref 80.0–100.0)
Monocytes Absolute: 0.4 10*3/uL (ref 0.1–1.0)
Monocytes Relative: 7 %
Neutro Abs: 3.3 10*3/uL (ref 1.7–7.7)
Neutrophils Relative %: 61 %
Platelets: 261 10*3/uL (ref 150–400)
RBC: 4.72 MIL/uL (ref 3.87–5.11)
RDW: 12.2 % (ref 11.5–15.5)
WBC: 5.4 10*3/uL (ref 4.0–10.5)
nRBC: 0 % (ref 0.0–0.2)

## 2020-04-19 LAB — HIV ANTIBODY (ROUTINE TESTING W REFLEX): HIV Screen 4th Generation wRfx: NONREACTIVE

## 2020-04-19 SURGERY — ECHOCARDIOGRAM, TRANSESOPHAGEAL
Anesthesia: Monitor Anesthesia Care

## 2020-04-19 SURGERY — LOOP RECORDER INSERTION

## 2020-04-19 MED ORDER — HYDRALAZINE HCL 20 MG/ML IJ SOLN
10.0000 mg | Freq: Once | INTRAMUSCULAR | Status: AC
  Administered 2020-04-19: 10 mg via INTRAVENOUS

## 2020-04-19 MED ORDER — CLOPIDOGREL BISULFATE 75 MG PO TABS: 75.0000 mg | ORAL_TABLET | Freq: Every day | ORAL | 0 refills | Status: AC

## 2020-04-19 MED ORDER — HYDRALAZINE HCL 20 MG/ML IJ SOLN
INTRAMUSCULAR | Status: AC
  Filled 2020-04-19: qty 1

## 2020-04-19 MED ORDER — ASPIRIN EC 81 MG PO TBEC: 81.0000 mg | DELAYED_RELEASE_TABLET | Freq: Every day | ORAL | 0 refills | Status: DC

## 2020-04-19 MED ORDER — LIDOCAINE HCL (PF) 1 % IJ SOLN
INTRAMUSCULAR | Status: DC | PRN
  Administered 2020-04-19: 30 mL

## 2020-04-19 MED ORDER — LIDOCAINE 2% (20 MG/ML) 5 ML SYRINGE
INTRAMUSCULAR | Status: DC | PRN
  Administered 2020-04-19: 40 mg via INTRAVENOUS

## 2020-04-19 MED ORDER — LISINOPRIL 10 MG PO TABS: 10.0000 mg | ORAL_TABLET | Freq: Every day | ORAL | 0 refills | Status: DC

## 2020-04-19 MED ORDER — ATORVASTATIN CALCIUM 40 MG PO TABS: 40.0000 mg | ORAL_TABLET | Freq: Every day | ORAL | 0 refills | Status: DC

## 2020-04-19 MED ORDER — BUTAMBEN-TETRACAINE-BENZOCAINE 2-2-14 % EX AERO
INHALATION_SPRAY | CUTANEOUS | Status: DC | PRN
  Administered 2020-04-19: 2 via TOPICAL

## 2020-04-19 MED ORDER — LIDOCAINE-EPINEPHRINE 1 %-1:100000 IJ SOLN
INTRAMUSCULAR | Status: AC
  Filled 2020-04-19: qty 1

## 2020-04-19 MED ORDER — PROPOFOL 10 MG/ML IV BOLUS
INTRAVENOUS | Status: DC | PRN
  Administered 2020-04-19: 25 mg via INTRAVENOUS
  Administered 2020-04-19: 20 mg via INTRAVENOUS
  Administered 2020-04-19: 15 mg via INTRAVENOUS
  Administered 2020-04-19: 25 mg via INTRAVENOUS

## 2020-04-19 MED ORDER — PROPOFOL 500 MG/50ML IV EMUL
INTRAVENOUS | Status: DC | PRN
  Administered 2020-04-19: 150 ug/kg/min via INTRAVENOUS

## 2020-04-19 SURGICAL SUPPLY — 2 items
MONITOR REVEAL LINQ II (Prosthesis & Implant Heart) ×2 IMPLANT
PACK LOOP INSERTION (CUSTOM PROCEDURE TRAY) ×3 IMPLANT

## 2020-04-19 NOTE — Progress Notes (Signed)
  Echocardiogram Echocardiogram Transesophageal has been performed.  Burnard Hawthorne 04/19/2020, 2:53 PM

## 2020-04-19 NOTE — Consult Note (Addendum)
ELECTROPHYSIOLOGY CONSULT NOTE  Patient ID: Vicki Stevens MRN: [id number], DOB/AGE: [birth year]   Admit date: 04/17/2020 Date of Consult: 04/19/2020  Primary Physician: Madelin Headings, MD Primary Cardiologist: No primary care provider on file.  Primary Electrophysiologist: New to Dr. Lalla Brothers Reason for Consultation: Cryptogenic stroke; recommendations regarding Implantable Loop Recorder Insurance: '[employer]'  History of Present Illness EP has been asked to evaluate Virgel Manifold for placement of an implantable loop recorder to monitor for atrial fibrillation by Dr Pearlean Brownie.  The patient was admitted on 04/17/2020 with left sided numbness and waxing waning headaches and visual changed.  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   She has undergone workup for stroke including;    code Stroke CT Head - not ordered  CT head - Normal exam.  MRI head - 6 mm acute ischemic nonhemorrhagic cortical infarct involving the right occipital lobe, right PCA distribution. Underlying mild chronic microvascular ischemic disease for age.   MRA head - Proximal right P2 occlusion, consistent with the acute right PCA territory infarct. There may be an underlying chronic stenosis at this location given the relative small size of the infarct.   MRA neck - Normal  CTA H&N - not ordered  CT Perfusion - not ordered  Carotid Doppler - MRA neck ordered - carotid dopplers not indicated  Echo with LVEF 50-55%. No source of embolus noted.  Inpatient stroke work-up will require a TEE per Neurology.   Lab work is reviewed.  Prior to admission, the patient denies chest pain, shortness of breath, dizziness, or syncope. She has intermittent brief palpitations while resting quietly. Single beat. Likely consistent with PACs noted on tele. She is recovering from her stroke with plans to return home  at  discharge.  Past Medical History:  Diagnosis Date  . H/O hematuria remote   dr Vonita Moss felt to be from running   . Heart murmur    evaluation ? mvp when younger  . Hx of varicella   . LOW BACK PAIN, ACUTE 03/05/2010     Surgical History: No past surgical history on file.   Medications Prior to Admission  Medication Sig Dispense Refill Last Dose  . Cyanocobalamin 2500 MCG TABS Take by mouth. (Patient not taking: Reported on 04/18/2020)   Not Taking at Unknown time  . lisinopril (PRINIVIL,ZESTRIL) 10 MG tablet Take 1 tablet (10 mg total) by mouth daily. (Patient not taking: Reported on 04/18/2020) 90 tablet 1 Not Taking at Unknown time  . Multiple Vitamins-Minerals (ADULT GUMMY PO) Take by mouth. (Patient not taking: Reported on 04/18/2020)   Not Taking at Unknown time  . Pseudoephedrine-Acetaminophen (SM NON-ASPRIN SINUS PO) Take by mouth. (Patient not taking: Reported on 04/18/2020)   Not Taking at Unknown time    Inpatient Medications:  . aspirin  300 mg Rectal Daily   Or  . aspirin  325 mg Oral Daily  . atorvastatin  40 mg Oral Daily  . enoxaparin (LOVENOX) injection  40 mg Subcutaneous Q24H    Allergies: No Known Allergies  Social History   Socioeconomic History  . Marital status: Married    Spouse name: Not on file  . Number of children: Not on file  . Years of education: Not on file  . Highest education level: Not on file  Occupational History  . Not on file  Tobacco Use  . Smoking status: Never Smoker  . Smokeless tobacco: Never Used  Substance and Sexual Activity  . Alcohol use: Yes    Alcohol/week: 0.0 standard drinks  . Drug use: Not on file  . Sexual activity: Not on file  Other Topics Concern  . 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 Lung cancer Father        27  . Other Mother        MVA 25       Review of Systems: All other systems reviewed and are otherwise negative except as noted above.  Physical Exam: Vitals:   04/18/20 2005 04/19/20 0040 04/19/20 0333 04/19/20 0818  BP: (!) 163/116 (!) 144/89 (!) 145/82 (!) 158/83  Pulse: (!) 59 61 (!) 58 (!) 57  Resp: 17 17 17 18   Temp: 97.9 F (36.6 C) 98.1 F (36.7 C) 98.1 F (36.7 C) 98 F (36.7 C)  TempSrc: Oral Oral Oral Oral  SpO2: 97% 96% 96% 95%    GEN- The patient is well appearing, alert and oriented x 3 today.   Head- normocephalic, atraumatic Eyes-  Sclera clear, conjunctiva pink Ears- hearing intact Oropharynx- clear Neck- supple Lungs- Clear to ausculation bilaterally, normal work of breathing Heart- Regular rate and rhythm, no murmurs, rubs or gallops  GI- soft, NT, ND, + BS Extremities- no clubbing, cyanosis, or edema MS- no significant deformity or atrophy Skin- no rash or lesion Psych- euthymic mood, full affect   Labs:   Lab Results  Component Value Date   WBC 5.4 04/19/2020   HGB 14.6 04/19/2020   HCT 43.4 04/19/2020   MCV 91.9 04/19/2020   PLT 261 04/19/2020    Recent Labs  Lab 04/17/20 1703 04/17/20 1724 04/19/20 0451  NA 140   < > 141  K 3.8   < > 4.1  CL 102   < > 105  CO2 29  --  28  BUN 18   < > 11  CREATININE 0.59   < > 0.56  CALCIUM 9.4  --  9.2  PROT 6.2*  --   --   BILITOT 0.5  --   --   ALKPHOS 63  --   --   ALT 17  --   --   AST 19  --   --   GLUCOSE 113*   < > 122*   < > = values in this interval not displayed.     Radiology/Studies: CT HEAD WO CONTRAST  Result Date: 04/17/2020 CLINICAL DATA:  Numbness and vibrating sensation over LEFT side of body, 2 minutes episode  approximately 45 minutes prior to presentation, symptoms resolved, has dull headache and LEFT peripheral vision disturbance; no history of stroke EXAM: CT HEAD WITHOUT CONTRAST TECHNIQUE: Contiguous axial images were obtained from the base of the skull through the vertex without intravenous contrast. Sagittal and coronal MPR images reconstructed from axial data set. COMPARISON:  None FINDINGS: Brain: Normal ventricular morphology. No midline shift or mass  effect. Normal appearance of brain parenchyma. No intracranial hemorrhage, mass lesion or evidence of acute infarction. No extra-axial fluid collections. Vascular: No hyperdense vessels Skull: Intact Sinuses/Orbits: Clear Other: N/A IMPRESSION: Normal exam. Electronically Signed   By: Ulyses Southward M.D.   On: 04/17/2020 18:21   MR ANGIO HEAD WO CONTRAST  Result Date: 04/17/2020 CLINICAL DATA:  Initial evaluation for acute neuro deficit, acute stroke suspected. Transient numbness and vibratory sensation involving the left-sided body. Headache. EXAM: MRI HEAD WITHOUT CONTRAST MRA HEAD WITHOUT CONTRAST MRA NECK WITHOUT AND WITH CONTRAST TECHNIQUE: Multiplanar, multiecho pulse sequences of the brain and surrounding structures were obtained without intravenous contrast. Angiographic images of the Circle of Willis were obtained using MRA technique without intravenous contrast. Angiographic images of the neck were obtained using MRA technique without and with intravenous contrast. Carotid stenosis measurements (when applicable) are obtained utilizing NASCET criteria, using the distal internal carotid diameter as the denominator. CONTRAST:  5.48mL GADAVIST GADOBUTROL 1 MMOL/ML IV SOLN COMPARISON:  Prior head CT from earlier the same day. FINDINGS: MRI HEAD FINDINGS Brain: Cerebral volume within normal limits for patient age. Mild patchy T2/FLAIR hyperintensity seen within the periventricular white matter and pons, most likely related chronic small vessel ischemic disease,  mild in nature. Probable small remote lacunar infarct noted at the left midbrain (series 16, image 23). 6 mm focus of restricted diffusion seen involving the cortical gray matter of the right occipital lobe, consistent with an acute ischemic infarct (series 5, image 73), right PCA distribution. No associated hemorrhage or mass effect. No other diffusion abnormality to suggest acute or subacute ischemia. Gray-white matter differentiation otherwise maintained. No encephalomalacia to suggest chronic cortical infarction elsewhere within the brain. No foci of susceptibility artifact to suggest acute or chronic intracranial hemorrhage. No mass lesion, midline shift or mass effect. No hydrocephalus. No extra-axial fluid collection. Major dural sinuses are grossly patent. Pituitary gland and suprasellar region are normal. Midline structures intact and normal. Vascular: Major intracranial vascular flow voids well maintained and normal in appearance. Skull and upper cervical spine: Craniocervical junction normal. Visualized upper cervical spine within normal limits. Bone marrow signal intensity normal. No scalp soft tissue abnormality. Sinuses/Orbits: Globes and orbital soft tissues within normal limits. Minimal mucosal thickening noted within the ethmoidal air cells. Paranasal sinuses are otherwise clear. No mastoid effusion. Inner ear structures grossly normal. Other: None. MRA HEAD FINDINGS ANTERIOR CIRCULATION: Visualized distal cervical segments of the internal carotid arteries are patent with symmetric antegrade flow. Petrous, cavernous, and supraclinoid segments widely patent without stenosis or other abnormality. A1 segments patent bilaterally. Normal anterior communicating artery complex. Anterior cerebral arteries patent to their distal aspects without stenosis. No M1 stenosis or occlusion. Normal MCA bifurcations. Distal MCA branches well perfused and symmetric. POSTERIOR CIRCULATION: Dominant left V4 segment  widely patent without stenosis. Patent left PICA origin. Right V4 segment diffusely hypoplastic but patent as well. Right PICA not seen. Basilar patent to its distal aspect without stenosis. Superior cerebral arteries patent bilaterally. Both PCAs primarily supplied via the basilar. Left PCA widely patent and well perfused to its distal aspect. There is apparent occlusion of the proximal right P2 segment (series 1054, image 12). No significant flow within the right PCA distribution distally evident by MRA. No intracranial aneurysm. MRA NECK FINDINGS AORTIC ARCH: Visualized aortic arch of normal caliber with normal 3 vessel morphology. No hemodynamically significant stenosis seen about the origin of the great vessels. RIGHT CAROTID SYSTEM: Right common and internal carotid arteries widely patent without  stenosis, evidence for dissection, or occlusion. No significant atheromatous irregularity or narrowing about the right carotid bifurcation. LEFT CAROTID SYSTEM: Left common and internal carotid arteries widely patent without stenosis, evidence for dissection or occlusion. No significant atheromatous irregularity or narrowing about the left carotid bifurcation. VERTEBRAL ARTERIES: Both vertebral arteries arise from the subclavian arteries. Strongly dominant left vertebral artery with a diffusely hypoplastic right vertebral artery. Vertebral arteries widely patent within the neck without stenosis, evidence for dissection, or occlusion. IMPRESSION: MRI HEAD IMPRESSION: 1. 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 2. Underlying mild chronic microvascular ischemic disease for age. MRA HEAD IMPRESSION: 1. Proximal right P2 occlusion, consistent with the acute right PCA territory infarct. There may be an underlying chronic stenosis at this location given the relative small size of the infarct. 2. Otherwise normal intracranial MRA. No other large vessel  occlusion, hemodynamically significant stenosis, or other acute vascular abnormality. MRA NECK IMPRESSION: Normal MRA of the neck. Electronically Signed   By: Rise Mu M.D.   On: 04/17/2020 22:37   MR Angiogram Neck W or Wo Contrast  Result Date: 04/17/2020 CLINICAL DATA:  Initial evaluation for acute neuro deficit, acute stroke suspected. Transient numbness and vibratory sensation involving the left-sided body. Headache. EXAM: MRI HEAD WITHOUT CONTRAST MRA HEAD WITHOUT CONTRAST MRA NECK WITHOUT AND WITH CONTRAST TECHNIQUE: Multiplanar, multiecho pulse sequences of the brain and surrounding structures were obtained without intravenous contrast. Angiographic images of the Circle of Willis were obtained using MRA technique without intravenous contrast. Angiographic images of the neck were obtained using MRA technique without and with intravenous contrast. Carotid stenosis measurements (when applicable) are obtained utilizing NASCET criteria, using the distal internal carotid diameter as the denominator. CONTRAST:  5.8mL GADAVIST GADOBUTROL 1 MMOL/ML IV SOLN COMPARISON:  Prior head CT from earlier the same day. FINDINGS: MRI HEAD FINDINGS Brain: Cerebral volume within normal limits for patient age. Mild patchy T2/FLAIR hyperintensity seen within the periventricular white matter and pons, most likely related chronic small vessel ischemic disease, mild in nature. Probable small remote lacunar infarct noted at the left midbrain (series 16, image 23). 6 mm focus of restricted diffusion seen involving the cortical gray matter of the right occipital lobe, consistent with an acute ischemic infarct (series 5, image 73), right PCA distribution. No associated hemorrhage or mass effect. No other diffusion abnormality to suggest acute or subacute ischemia. Gray-white matter differentiation otherwise maintained. No encephalomalacia to suggest chronic cortical infarction elsewhere within the brain. No foci of  susceptibility artifact to suggest acute or chronic intracranial hemorrhage. No mass lesion, midline shift or mass effect. No hydrocephalus. No extra-axial fluid collection. Major dural sinuses are grossly patent. Pituitary gland and suprasellar region are normal. Midline structures intact and normal. Vascular: Major intracranial vascular flow voids well maintained and normal in appearance. Skull and upper cervical spine: Craniocervical junction normal. Visualized upper cervical spine within normal limits. Bone marrow signal intensity normal. No scalp soft tissue abnormality. Sinuses/Orbits: Globes and orbital soft tissues within normal limits. Minimal mucosal thickening noted within the ethmoidal air cells. Paranasal sinuses are otherwise clear. No mastoid effusion. Inner ear structures grossly normal. Other: None. MRA HEAD FINDINGS ANTERIOR CIRCULATION: Visualized distal cervical segments of the internal carotid arteries are patent with symmetric antegrade flow. Petrous, cavernous, and supraclinoid segments widely patent without stenosis or other abnormality. A1 segments patent bilaterally. Normal anterior communicating artery complex. Anterior cerebral arteries patent to their distal aspects without stenosis. No M1  stenosis or occlusion. Normal MCA bifurcations. Distal MCA branches well perfused and symmetric. POSTERIOR CIRCULATION: Dominant left V4 segment widely patent without stenosis. Patent left PICA origin. Right V4 segment diffusely hypoplastic but patent as well. Right PICA not seen. Basilar patent to its distal aspect without stenosis. Superior cerebral arteries patent bilaterally. Both PCAs primarily supplied via the basilar. Left PCA widely patent and well perfused to its distal aspect. There is apparent occlusion of the proximal right P2 segment (series 1054, image 12). No significant flow within the right PCA distribution distally evident by MRA. No intracranial aneurysm. MRA NECK FINDINGS AORTIC  ARCH: Visualized aortic arch of normal caliber with normal 3 vessel morphology. No hemodynamically significant stenosis seen about the origin of the great vessels. RIGHT CAROTID SYSTEM: Right common and internal carotid arteries widely patent without stenosis, evidence for dissection, or occlusion. No significant atheromatous irregularity or narrowing about the right carotid bifurcation. LEFT CAROTID SYSTEM: Left common and internal carotid arteries widely patent without stenosis, evidence for dissection or occlusion. No significant atheromatous irregularity or narrowing about the left carotid bifurcation. VERTEBRAL ARTERIES: Both vertebral arteries arise from the subclavian arteries. Strongly dominant left vertebral artery with a diffusely hypoplastic right vertebral artery. Vertebral arteries widely patent within the neck without stenosis, evidence for dissection, or occlusion. IMPRESSION: MRI HEAD IMPRESSION: 1. 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 2. Underlying mild chronic microvascular ischemic disease for age. MRA HEAD IMPRESSION: 1. Proximal right P2 occlusion, consistent with the acute right PCA territory infarct. There may be an underlying chronic stenosis at this location given the relative small size of the infarct. 2. Otherwise normal intracranial MRA. No other large vessel occlusion, hemodynamically significant stenosis, or other acute vascular abnormality. MRA NECK IMPRESSION: Normal MRA of the neck. Electronically Signed   By: Rise Mu M.D.   On: 04/17/2020 22:37   MR BRAIN WO CONTRAST  Result Date: 04/17/2020 CLINICAL DATA:  Initial evaluation for acute neuro deficit, acute stroke suspected. Transient numbness and vibratory sensation involving the left-sided body. Headache. EXAM: MRI HEAD WITHOUT CONTRAST MRA HEAD WITHOUT CONTRAST MRA NECK WITHOUT AND WITH CONTRAST TECHNIQUE: Multiplanar, multiecho pulse  sequences of the brain and surrounding structures were obtained without intravenous contrast. Angiographic images of the Circle of Willis were obtained using MRA technique without intravenous contrast. Angiographic images of the neck were obtained using MRA technique without and with intravenous contrast. Carotid stenosis measurements (when applicable) are obtained utilizing NASCET criteria, using the distal internal carotid diameter as the denominator. CONTRAST:  5.21mL GADAVIST GADOBUTROL 1 MMOL/ML IV SOLN COMPARISON:  Prior head CT from earlier the same day. FINDINGS: MRI HEAD FINDINGS Brain: Cerebral volume within normal limits for patient age. Mild patchy T2/FLAIR hyperintensity seen within the periventricular white matter and pons, most likely related chronic small vessel ischemic disease, mild in nature. Probable small remote lacunar infarct noted at the left midbrain (series 16, image 23). 6 mm focus of restricted diffusion seen involving the cortical gray matter of the right occipital lobe, consistent with an acute ischemic infarct (series 5, image 73), right PCA distribution. No associated hemorrhage or mass effect. No other diffusion abnormality to suggest acute or subacute ischemia. Gray-white matter differentiation otherwise maintained. No encephalomalacia to suggest chronic cortical infarction elsewhere within the brain. No foci of susceptibility artifact to suggest acute or chronic intracranial hemorrhage. No mass lesion, midline shift or mass effect. No hydrocephalus. No extra-axial fluid collection. Major dural sinuses are  grossly patent. Pituitary gland and suprasellar region are normal. Midline structures intact and normal. Vascular: Major intracranial vascular flow voids well maintained and normal in appearance. Skull and upper cervical spine: Craniocervical junction normal. Visualized upper cervical spine within normal limits. Bone marrow signal intensity normal. No scalp soft tissue abnormality.  Sinuses/Orbits: Globes and orbital soft tissues within normal limits. Minimal mucosal thickening noted within the ethmoidal air cells. Paranasal sinuses are otherwise clear. No mastoid effusion. Inner ear structures grossly normal. Other: None. MRA HEAD FINDINGS ANTERIOR CIRCULATION: Visualized distal cervical segments of the internal carotid arteries are patent with symmetric antegrade flow. Petrous, cavernous, and supraclinoid segments widely patent without stenosis or other abnormality. A1 segments patent bilaterally. Normal anterior communicating artery complex. Anterior cerebral arteries patent to their distal aspects without stenosis. No M1 stenosis or occlusion. Normal MCA bifurcations. Distal MCA branches well perfused and symmetric. POSTERIOR CIRCULATION: Dominant left V4 segment widely patent without stenosis. Patent left PICA origin. Right V4 segment diffusely hypoplastic but patent as well. Right PICA not seen. Basilar patent to its distal aspect without stenosis. Superior cerebral arteries patent bilaterally. Both PCAs primarily supplied via the basilar. Left PCA widely patent and well perfused to its distal aspect. There is apparent occlusion of the proximal right P2 segment (series 1054, image 12). No significant flow within the right PCA distribution distally evident by MRA. No intracranial aneurysm. MRA NECK FINDINGS AORTIC ARCH: Visualized aortic arch of normal caliber with normal 3 vessel morphology. No hemodynamically significant stenosis seen about the origin of the great vessels. RIGHT CAROTID SYSTEM: Right common and internal carotid arteries widely patent without stenosis, evidence for dissection, or occlusion. No significant atheromatous irregularity or narrowing about the right carotid bifurcation. LEFT CAROTID SYSTEM: Left common and internal carotid arteries widely patent without stenosis, evidence for dissection or occlusion. No significant atheromatous irregularity or narrowing about  the left carotid bifurcation. VERTEBRAL ARTERIES: Both vertebral arteries arise from the subclavian arteries. Strongly dominant left vertebral artery with a diffusely hypoplastic right vertebral artery. Vertebral arteries widely patent within the neck without stenosis, evidence for dissection, or occlusion. IMPRESSION: MRI HEAD IMPRESSION: 1. 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 2. Underlying mild chronic microvascular ischemic disease for age. MRA HEAD IMPRESSION: 1. Proximal right P2 occlusion, consistent with the acute right PCA territory infarct. There may be an underlying chronic stenosis at this location given the relative small size of the infarct. 2. Otherwise normal intracranial MRA. No other large vessel occlusion, hemodynamically significant stenosis, or other acute vascular abnormality. MRA NECK IMPRESSION: Normal MRA of the neck. Electronically Signed   By: Rise Mu M.D.   On: 04/17/2020 22:37   VAS [place] TRANSCRANIAL DOPPLER W BUBBLES  Result Date: 04/18/2020  Transcranial Doppler with Bubble Indications: Stroke. Comparison Study: No prior study Performing Technologist: Gertie Fey MHA, RDMS, RVT, RDCS  Examination Guidelines: A complete evaluation includes B-mode imaging, spectral Doppler, color Doppler, and power Doppler as needed of all accessible portions of each vessel. Bilateral testing is considered an integral part of a complete examination. Limited examinations for reoccurring indications may be performed as noted.  Summary: No HITS at rest or during Valsalva. Negative transcranial Doppler Bubble study with no evidence of right to left intracardiac communication.  A vascular evaluation was performed. The right middle cerebral artery was studied. An IV was inserted into the patient's right forearm. Verbal informed consent was obtained.  *See table(s) above for TCD measurements and  observations.     Preliminary    ECHOCARDIOGRAM COMPLETE  Result Date: 04/18/2020    ECHOCARDIOGRAM REPORT   Patient Name:   BABE ANTHIS Date of Exam: 04/18/2020 Medical Rec #:  [id number]     Height:       62.5 in Accession #:    [id number]    Weight:       129.9 lb Date of Birth:  21-Nov-1955     BSA:          1.601 m Patient Age:    64 years      BP:           164/86 mmHg Patient Gender: F             HR:           58 bpm. Exam Location:  Inpatient Procedure: 2D Echo, Cardiac Doppler and Color Doppler Indications:    Stroke 434.91 / I163.9  History:        Patient has no prior history of Echocardiogram examinations.                 Signs/Symptoms:Murmur.  Sonographer:    Elmarie Shiley Dance Referring Phys: [id number] Cecille Po MELVIN IMPRESSIONS  1. Left ventricular ejection fraction, by estimation, is 50 to 55%. The left ventricle has low normal function. The left ventricle has no regional wall motion abnormalities. There is mild left ventricular hypertrophy. Left ventricular diastolic parameters are consistent with Grade I diastolic dysfunction (impaired relaxation).  2. Right ventricular systolic function is normal. The right ventricular size is normal. There is normal pulmonary artery systolic pressure. The estimated right ventricular systolic pressure is 20.1 mmHg.  3. The mitral valve is normal in structure. Trivial mitral valve regurgitation.  4. The aortic valve is tricuspid. Aortic valve regurgitation is not visualized. No aortic stenosis is present. FINDINGS  Left Ventricle: Left ventricular ejection fraction, by estimation, is 50 to 55%. The left ventricle has low normal function. The left ventricle has no regional wall motion abnormalities. The left ventricular internal cavity size was normal in size. There is mild left ventricular hypertrophy. Left ventricular diastolic parameters are consistent with Grade I diastolic dysfunction (impaired relaxation). Right Ventricle: The right ventricular size is normal. No increase in  right ventricular wall thickness. Right ventricular systolic function is normal. There is normal pulmonary artery systolic pressure. The tricuspid regurgitant velocity is 2.07 m/s, and  with an assumed right atrial pressure of 3 mmHg, the estimated right ventricular systolic pressure is 20.1 mmHg. Left Atrium: Left atrial size was normal in size. Right Atrium: Right atrial size was normal in size. Pericardium: Trivial pericardial effusion is present. Mitral Valve: The mitral valve is normal in structure. Trivial mitral valve regurgitation. Tricuspid Valve: The tricuspid valve is normal in structure. Tricuspid valve regurgitation is trivial. Aortic Valve: The aortic valve is tricuspid. Aortic valve regurgitation is not visualized. No aortic stenosis is present. Pulmonic Valve: The pulmonic valve was not well visualized. Pulmonic valve regurgitation is not visualized. Aorta: The aortic root and ascending aorta are structurally normal, with no evidence of dilitation. IAS/Shunts: The interatrial septum was not well visualized.  LEFT VENTRICLE PLAX 2D LVIDd:         4.80 cm  Diastology LVIDs:         3.50 cm  LV e' medial:    3.05 cm/s LV PW:         1.10 cm  LV E/e' medial:  13.5 LV IVS:  0.80 cm  LV e' lateral:   4.66 cm/s LVOT diam:     1.80 cm  LV E/e' lateral: 8.8 LV SV:         43 LV SV Index:   27 LVOT Area:     2.54 cm  RIGHT VENTRICLE             IVC RV Basal diam:  2.60 cm     IVC diam: 1.60 cm RV S prime:     17.40 cm/s TAPSE (M-mode): 1.7 cm LEFT ATRIUM             Index       RIGHT ATRIUM          Index LA diam:        3.60 cm 2.25 cm/m  RA Area:     9.37 cm LA Vol (A2C):   45.5 ml 28.42 ml/m RA Volume:   16.10 ml 10.06 ml/m LA Vol (A4C):   31.4 ml 19.61 ml/m LA Biplane Vol: 37.0 ml 23.11 ml/m  AORTIC VALVE LVOT Vmax:   72.45 cm/s LVOT Vmean:  53.000 cm/s LVOT VTI:    0.170 m  AORTA Ao Root diam: 2.70 cm Ao Asc diam:  3.20 cm MITRAL VALVE               TRICUSPID VALVE MV Area (PHT): 3.12 cm     TR Peak grad:   17.1 mmHg MV Decel Time: 243 msec    TR Vmax:        207.00 cm/s MV E velocity: 41.10 cm/s MV A velocity: 90.00 cm/s  SHUNTS MV E/A ratio:  0.46        Systemic VTI:  0.17 m                            Systemic Diam: 1.80 cm Epifanio Lesches MD Electronically signed by Epifanio Lesches MD Signature Date/Time: 04/18/2020/5:54:18 PM    Final    VAS [place] LOWER EXTREMITY VENOUS (DVT)  Result Date: 04/18/2020  Lower Venous DVT Study Indications: Stroke.  Comparison Study: No prior study Performing Technologist: Gertie Fey MHA, RDMS, RVT, RDCS  Examination Guidelines: A complete evaluation includes B-mode imaging, spectral Doppler, color Doppler, and power Doppler as needed of all accessible portions of each vessel. Bilateral testing is considered an integral part of a complete examination. Limited examinations for reoccurring indications may be performed as noted. The reflux portion of the exam is performed with the patient in reverse Trendelenburg.  +---------+---------------+---------+-----------+----------+--------------+ RIGHT    CompressibilityPhasicitySpontaneityPropertiesThrombus Aging +---------+---------------+---------+-----------+----------+--------------+ CFV      Full           Yes      Yes                                 +---------+---------------+---------+-----------+----------+--------------+ SFJ      Full                                                        +---------+---------------+---------+-----------+----------+--------------+ FV Prox  Full                                                        +---------+---------------+---------+-----------+----------+--------------+  FV Mid   Full                                                        +---------+---------------+---------+-----------+----------+--------------+ FV DistalFull                                                         +---------+---------------+---------+-----------+----------+--------------+ PFV      Full                                                        +---------+---------------+---------+-----------+----------+--------------+ POP      Full           Yes      Yes                                 +---------+---------------+---------+-----------+----------+--------------+ PTV      Full                                                        +---------+---------------+---------+-----------+----------+--------------+ PERO     Full                                                        +---------+---------------+---------+-----------+----------+--------------+   +---------+---------------+---------+-----------+----------+--------------+ LEFT     CompressibilityPhasicitySpontaneityPropertiesThrombus Aging +---------+---------------+---------+-----------+----------+--------------+ CFV      Full           Yes      Yes                                 +---------+---------------+---------+-----------+----------+--------------+ SFJ      Full                                                        +---------+---------------+---------+-----------+----------+--------------+ FV Prox  Full                                                        +---------+---------------+---------+-----------+----------+--------------+ FV Mid   Full                                                        +---------+---------------+---------+-----------+----------+--------------+  FV DistalFull                                                        +---------+---------------+---------+-----------+----------+--------------+ PFV      Full                                                        +---------+---------------+---------+-----------+----------+--------------+ POP      Full           Yes      Yes                                  +---------+---------------+---------+-----------+----------+--------------+ PTV      Full                                                        +---------+---------------+---------+-----------+----------+--------------+ PERO     Full                                                        +---------+---------------+---------+-----------+----------+--------------+     Summary: RIGHT: - There is no evidence of deep vein thrombosis in the lower extremity.  - No cystic structure found in the popliteal fossa.  LEFT: - There is no evidence of deep vein thrombosis in the lower extremity.  - No cystic structure found in the popliteal fossa.  *See table(s) above for measurements and observations. Electronically signed by Lemar LivingsBrandon Cain MD on 04/18/2020 at 4:51:37 PM.    Final     12-lead ECG NSR at 64 bpm (personally reviewed) All prior EKG's in EPIC reviewed with no documented atrial fibrillation  Telemetry NSR 50-90s (personally reviewed)  Assessment and Plan:  1. Cryptogenic stroke The patient presents with cryptogenic stroke.  The patient does have a TEE planned for this afternoon.  I spoke at length with the patient about monitoring for afib with an implantable loop recorder.  Risks, benefits, and alteratives to implantable loop recorder were discussed with the patient today.   At this time, the patient is very clear in their decision to proceed with implantable loop recorder.   Wound care was reviewed with the patient (keep incision clean and dry for 3 days).  Wound check scheduled and entered in AVS. Please call with questions.   Graciella FreerMichael Andrew Jorel Gravlin, PA-C 04/19/2020 9:47 AM

## 2020-04-19 NOTE — TOC Transition Note (Signed)
Transition of Care [facility]) - CM/SW Discharge Note   Patient Details  Name: Vicki Stevens MRN: [id number] Date of Birth: 22-Mar-1956  [facility] CM/SW Contact:  Kermit Balo, RN Phone Number: 04/19/2020, 4:11 PM   Clinical Narrative:    Pt discharging home with self care. No f/u per PT.  No needs per TOC.   Final next level of care: Home/Self Care Barriers to Discharge: No Barriers Identified   Patient Goals and CMS Choice        Discharge Placement                       Discharge Plan and Services                                     Social Determinants of Health (SDOH) Interventions     Readmission Risk Interventions No flowsheet data found.

## 2020-04-19 NOTE — CV Procedure (Signed)
INDICATIONS: stroke  PROCEDURE:   Informed consent was obtained prior to the procedure. The risks, benefits and alternatives for the procedure were discussed and the patient comprehended these risks.  Risks include, but are not limited to, cough, sore throat, vomiting, nausea, somnolence, esophageal and stomach trauma or perforation, bleeding, low blood pressure, aspiration, pneumonia, infection, trauma to the teeth and death.    After a procedural time-out, the oropharynx was anesthetized with 20% benzocaine spray.   During this procedure the patient was administered propofol per anesthesia.  The patient's heart rate, blood pressure, and oxygen saturation were monitored continuously during the procedure. The period of conscious sedation was 45 minutes, of which I was present face-to-face 100% of this time.  The transesophageal probe was inserted in the esophagus and stomach without difficulty and multiple views were obtained.  The patient was kept under observation until the patient left the procedure room.  The patient left the procedure room in stable condition.   Agitated microbubble saline contrast was administered.  COMPLICATIONS:    There were no immediate complications.  FINDINGS:   FORMAL ECHOCARDIOGRAM REPORT PENDING Normal biventricular size and function Trivial MR and TR No LAA thrombus No PFO  RECOMMENDATIONS:    Can proceed to loop recorder implant  Time Spent Directly with the Patient:  60 minutes   Parke Poisson 04/19/2020, 3:08 PM

## 2020-04-19 NOTE — Anesthesia Postprocedure Evaluation (Signed)
Anesthesia Post Note  Patient: Vicki Stevens  Procedure(s) Performed: TRANSESOPHAGEAL ECHOCARDIOGRAM (TEE) (N/A ) BUBBLE STUDY     Patient location during evaluation: Endoscopy Anesthesia Type: MAC Level of consciousness: awake and alert Pain management: pain level controlled Vital Signs Assessment: post-procedure vital signs reviewed and stable Respiratory status: spontaneous breathing, nonlabored ventilation and respiratory function stable Cardiovascular status: blood pressure returned to baseline Postop Assessment: no apparent nausea or vomiting Anesthetic complications: no Comments: BP elevated gain same as preop. Patient is aware and is asymptomatic. Advised patient she will need po meds to keep BP under control as the IV meds perioperatively are only a temporary solution.  Plan to treat while inpatient. Norton Blizzard, MD     No complications documented.  Last Vitals:  Vitals:   04/19/20 1337 04/19/20 1426  BP: (!) 198/94 128/73  Pulse: 72 64  Resp: 16 11  Temp: 37.2 C 36.5 C  SpO2: 100% 98%    Last Pain:  Vitals:   04/19/20 1426  TempSrc: Axillary  PainSc:                  [name]

## 2020-04-19 NOTE — Discharge Summary (Signed)
Discharge Summary  Vicki Stevens JIR:[id number] DOB: 12-Apr-1956  PCP: Madelin Headings, MD  Admit date: 04/17/2020 Discharge date: 04/19/2020  Time spent: 30 mins  Recommendations for Outpatient Follow-up:  1. Follow-up with PCP in 1 week 2. Follow-up with neurology as scheduled 3. Follow-up with cardiology/EP as scheduled    Discharge Diagnoses:  Active Hospital Problems   Diagnosis Date Noted  . CVA (cerebral vascular accident) (HCC) 04/17/2020  . Essential hypertension 08/04/2012    Resolved Hospital Problems  No resolved problems to display.    Discharge Condition: Stable  Diet recommendation: Heart healthy  Vitals:   04/19/20 1528 04/19/20 1555  BP: (!) 158/65 (!) 157/80  Pulse:  70  Resp:  16  Temp:  98.3 F (36.8 C)  SpO2:  100%    History of present illness:  Vicki Stevens a 64 y.o.femalewith medical history significant ofhypertension, insomnia who presents after an episode of left-sided numbness. She states that she had an episode of left-sided numbness with some vibration sensation lasting about 2 minutes. She additionally reports that she has had about 1 week of waxing and waning headache; 5 days of intermittent episodes of left peripheral vision changes that lasts for about 1 minute at a time and have occurred about 4 separate times in the past 5 days;and then the episode of left-sided numbness and vibration sensation which was followed by a dull headache and prompted her to present to the ED. In the ED, vitals significant for BP in the 170s to 200s systolic. Lab work-up unremarkable. Respiratory panel for flu and Covid negative.CT head was without acute abnormality. MR brain showed acute CVA in PCA territory. MRA showed occlusion of the right P2.  Neurology consulted.  Patient admitted for further management.    Today, patient denied any new complaints, denies any further numbness, changes, headaches, chest pain, abdominal pain, nausea/vomiting.   Patient advised to follow-up with PCP in 1 week, neurology, cardiology/EP   Hospital Course:  Principal Problem:   CVA (cerebral vascular accident) [facility]) Active Problems:   Essential hypertension  Acute CVA MR brain showed acute CVA in the PCA territory, MRA showed occlusion of the right P2 A1c 5.3, LDL 133 Echo with EF of 50 to 55%, no regional wall motion abnormality, grade 1 diastolic dysfunction TEE done showed normal biventricular size and function, no LAA thrombus, no PFO Cardiology/EP on board, loop recorder placed on 04/19/2020 PT/OT/SLP-no further rec Continue ASA and Plavix for 3 weeks, then aspirin alone, continue Lipitor Follow-up with neurology  Hypertension Restart home lisinopril  Hyperlipidemia Continue Lipitor      Malnutrition Type:      Malnutrition Characteristics:      Nutrition Interventions:      Estimated body mass index is 21.26 kg/m as calculated from the following:   Height as of this encounter: 5\' 3"  (1.6 m).   Weight as of this encounter: 54.4 kg.    Procedures:  TEE on 04/19/2020  Loop recorder on 04/19/2020  Consultations:  Neurology  Cardiology  Discharge Exam: BP (!) 157/80 (BP Location: Left Arm)   Pulse 70   Temp 98.3 F (36.8 C) (Oral)   Resp 16   Ht 5\' 3"  (1.6 m)   Wt 54.4 kg   SpO2 100%   BMI 21.26 kg/m   General: NAD Cardiovascular: S1, S2 present Respiratory: CTA B Neurology: Strength equal in all extremities, no obvious focal neurologic deficits noted   Discharge Instructions You were cared for by a hospitalist  during your hospital stay. If you have any questions about your discharge medications or the care you received while you were in the hospital after you are discharged, you can call the unit and asked to speak with the hospitalist on call if the hospitalist that took care of you is not available. Once you are discharged, your primary care physician will handle any further medical issues. Please  note that NO REFILLS for any discharge medications will be authorized once you are discharged, as it is imperative that you return to your primary care physician (or establish a relationship with a primary care physician if you do not have one) for your aftercare needs so that they can reassess your need for medications and monitor your lab values.  Discharge Instructions    Ambulatory referral to Neurology   Complete by: As directed    An appointment is requested in approximately: 1 week   Diet - low sodium heart healthy   Complete by: As directed    Increase activity slowly   Complete by: As directed      Allergies as of 04/19/2020   No Known Allergies     Medication List    TAKE these medications   ADULT GUMMY PO Take by mouth.   aspirin EC 81 MG tablet Take 1 tablet (81 mg total) by mouth daily. Swallow whole.   atorvastatin 40 MG tablet Commonly known as: LIPITOR Take 1 tablet (40 mg total) by mouth daily. Start taking on: April 20, 2020   clopidogrel 75 MG tablet Commonly known as: Plavix Take 1 tablet (75 mg total) by mouth daily for 21 days.   Cyanocobalamin 2500 MCG Tabs Take by mouth.   lisinopril 10 MG tablet Commonly known as: ZESTRIL Take 1 tablet (10 mg total) by mouth daily.   SM NON-ASPRIN SINUS PO Take by mouth.      No Known Allergies  Follow-up Information    Schedule an appointment as soon as possible for a visit  with Panosh, [name], MD.   Specialties: Internal Medicine, Pediatrics Why: For recheck of your symptoms Contact information: [address] [facility] [place] [address] [phone]        [facility] Follow up on 05/01/2020.   Why: at 1040 am for post loop recorder wound check Contact information: [address] [place] [address] [phone]       [facility]. Schedule an appointment as soon as possible for a visit in 6  week(s).   Specialty: Neurology Contact information: [address] [place] [address] [phone]               The results of significant diagnostics from this hospitalization (including imaging, microbiology, ancillary and laboratory) are listed below for reference.    Significant Diagnostic Studies: CT HEAD WO CONTRAST  Result Date: 04/17/2020 CLINICAL DATA:  Numbness and vibrating sensation over LEFT side of body, 2 minutes episode approximately 45 minutes prior to presentation, symptoms resolved, has dull headache and LEFT peripheral vision disturbance; no history of stroke EXAM: CT HEAD WITHOUT CONTRAST TECHNIQUE: Contiguous axial images were obtained from the base of the skull through the vertex without intravenous contrast. Sagittal and coronal MPR images reconstructed from axial data set. COMPARISON:  None FINDINGS: Brain: Normal ventricular morphology. No midline shift or mass effect. Normal appearance of brain parenchyma. No intracranial hemorrhage, mass lesion or evidence of acute infarction. No extra-axial fluid collections. Vascular: No  hyperdense vessels Skull: Intact Sinuses/Orbits: Clear Other: N/A IMPRESSION: Normal exam. Electronically Signed   By: Ulyses Southward M.D.   On: 04/17/2020 18:21   MR ANGIO HEAD WO CONTRAST  Result Date: 04/17/2020 CLINICAL DATA:  Initial evaluation for acute neuro deficit, acute stroke suspected. Transient numbness and vibratory sensation involving the left-sided body. Headache. EXAM: MRI HEAD WITHOUT CONTRAST MRA HEAD WITHOUT CONTRAST MRA NECK WITHOUT AND WITH CONTRAST TECHNIQUE: Multiplanar, multiecho pulse sequences of the brain and surrounding structures were obtained without intravenous contrast. Angiographic images of the Circle of Willis were obtained using MRA technique without intravenous contrast. Angiographic images of the neck were obtained using MRA technique without and with intravenous contrast. Carotid  stenosis measurements (when applicable) are obtained utilizing NASCET criteria, using the distal internal carotid diameter as the denominator. CONTRAST:  5.18mL GADAVIST GADOBUTROL 1 MMOL/ML IV SOLN COMPARISON:  Prior head CT from earlier the same day. FINDINGS: MRI HEAD FINDINGS Brain: Cerebral volume within normal limits for patient age. Mild patchy T2/FLAIR hyperintensity seen within the periventricular white matter and pons, most likely related chronic small vessel ischemic disease, mild in nature. Probable small remote lacunar infarct noted at the left midbrain (series 16, image 23). 6 mm focus of restricted diffusion seen involving the cortical gray matter of the right occipital lobe, consistent with an acute ischemic infarct (series 5, image 73), right PCA distribution. No associated hemorrhage or mass effect. No other diffusion abnormality to suggest acute or subacute ischemia. Gray-white matter differentiation otherwise maintained. No encephalomalacia to suggest chronic cortical infarction elsewhere within the brain. No foci of susceptibility artifact to suggest acute or chronic intracranial hemorrhage. No mass lesion, midline shift or mass effect. No hydrocephalus. No extra-axial fluid collection. Major dural sinuses are grossly patent. Pituitary gland and suprasellar region are normal. Midline structures intact and normal. Vascular: Major intracranial vascular flow voids well maintained and normal in appearance. Skull and upper cervical spine: Craniocervical junction normal. Visualized upper cervical spine within normal limits. Bone marrow signal intensity normal. No scalp soft tissue abnormality. Sinuses/Orbits: Globes and orbital soft tissues within normal limits. Minimal mucosal thickening noted within the ethmoidal air cells. Paranasal sinuses are otherwise clear. No mastoid effusion. Inner ear structures grossly normal. Other: None. MRA HEAD FINDINGS ANTERIOR CIRCULATION: Visualized distal cervical  segments of the internal carotid arteries are patent with symmetric antegrade flow. Petrous, cavernous, and supraclinoid segments widely patent without stenosis or other abnormality. A1 segments patent bilaterally. Normal anterior communicating artery complex. Anterior cerebral arteries patent to their distal aspects without stenosis. No M1 stenosis or occlusion. Normal MCA bifurcations. Distal MCA branches well perfused and symmetric. POSTERIOR CIRCULATION: Dominant left V4 segment widely patent without stenosis. Patent left PICA origin. Right V4 segment diffusely hypoplastic but patent as well. Right PICA not seen. Basilar patent to its distal aspect without stenosis. Superior cerebral arteries patent bilaterally. Both PCAs primarily supplied via the basilar. Left PCA widely patent and well perfused to its distal aspect. There is apparent occlusion of the proximal right P2 segment (series 1054, image 12). No significant flow within the right PCA distribution distally evident by MRA. No intracranial aneurysm. MRA NECK FINDINGS AORTIC ARCH: Visualized aortic arch of normal caliber with normal 3 vessel morphology. No hemodynamically significant stenosis seen about the origin of the great vessels. RIGHT CAROTID SYSTEM: Right common and internal carotid arteries widely patent without stenosis, evidence for dissection, or occlusion. No significant atheromatous irregularity or narrowing about the right carotid bifurcation. LEFT CAROTID SYSTEM: Left common  and internal carotid arteries widely patent without stenosis, evidence for dissection or occlusion. No significant atheromatous irregularity or narrowing about the left carotid bifurcation. VERTEBRAL ARTERIES: Both vertebral arteries arise from the subclavian arteries. Strongly dominant left vertebral artery with a diffusely hypoplastic right vertebral artery. Vertebral arteries widely patent within the neck without stenosis, evidence for dissection, or occlusion.  IMPRESSION: MRI HEAD IMPRESSION: 1. 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 2. Underlying mild chronic microvascular ischemic disease for age. MRA HEAD IMPRESSION: 1. Proximal right P2 occlusion, consistent with the acute right PCA territory infarct. There may be an underlying chronic stenosis at this location given the relative small size of the infarct. 2. Otherwise normal intracranial MRA. No other large vessel occlusion, hemodynamically significant stenosis, or other acute vascular abnormality. MRA NECK IMPRESSION: Normal MRA of the neck. Electronically Signed   By: Rise Mu M.D.   On: 04/17/2020 22:37   MR Angiogram Neck W or Wo Contrast  Result Date: 04/17/2020 CLINICAL DATA:  Initial evaluation for acute neuro deficit, acute stroke suspected. Transient numbness and vibratory sensation involving the left-sided body. Headache. EXAM: MRI HEAD WITHOUT CONTRAST MRA HEAD WITHOUT CONTRAST MRA NECK WITHOUT AND WITH CONTRAST TECHNIQUE: Multiplanar, multiecho pulse sequences of the brain and surrounding structures were obtained without intravenous contrast. Angiographic images of the Circle of Willis were obtained using MRA technique without intravenous contrast. Angiographic images of the neck were obtained using MRA technique without and with intravenous contrast. Carotid stenosis measurements (when applicable) are obtained utilizing NASCET criteria, using the distal internal carotid diameter as the denominator. CONTRAST:  5.38mL GADAVIST GADOBUTROL 1 MMOL/ML IV SOLN COMPARISON:  Prior head CT from earlier the same day. FINDINGS: MRI HEAD FINDINGS Brain: Cerebral volume within normal limits for patient age. Mild patchy T2/FLAIR hyperintensity seen within the periventricular white matter and pons, most likely related chronic small vessel ischemic disease, mild in nature. Probable small remote lacunar infarct noted at the left  midbrain (series 16, image 23). 6 mm focus of restricted diffusion seen involving the cortical gray matter of the right occipital lobe, consistent with an acute ischemic infarct (series 5, image 73), right PCA distribution. No associated hemorrhage or mass effect. No other diffusion abnormality to suggest acute or subacute ischemia. Gray-white matter differentiation otherwise maintained. No encephalomalacia to suggest chronic cortical infarction elsewhere within the brain. No foci of susceptibility artifact to suggest acute or chronic intracranial hemorrhage. No mass lesion, midline shift or mass effect. No hydrocephalus. No extra-axial fluid collection. Major dural sinuses are grossly patent. Pituitary gland and suprasellar region are normal. Midline structures intact and normal. Vascular: Major intracranial vascular flow voids well maintained and normal in appearance. Skull and upper cervical spine: Craniocervical junction normal. Visualized upper cervical spine within normal limits. Bone marrow signal intensity normal. No scalp soft tissue abnormality. Sinuses/Orbits: Globes and orbital soft tissues within normal limits. Minimal mucosal thickening noted within the ethmoidal air cells. Paranasal sinuses are otherwise clear. No mastoid effusion. Inner ear structures grossly normal. Other: None. MRA HEAD FINDINGS ANTERIOR CIRCULATION: Visualized distal cervical segments of the internal carotid arteries are patent with symmetric antegrade flow. Petrous, cavernous, and supraclinoid segments widely patent without stenosis or other abnormality. A1 segments patent bilaterally. Normal anterior communicating artery complex. Anterior cerebral arteries patent to their distal aspects without stenosis. No M1 stenosis or occlusion. Normal MCA bifurcations. Distal MCA branches well perfused and symmetric. POSTERIOR CIRCULATION: Dominant left V4 segment widely patent without  stenosis. Patent left PICA origin. Right V4 segment  diffusely hypoplastic but patent as well. Right PICA not seen. Basilar patent to its distal aspect without stenosis. Superior cerebral arteries patent bilaterally. Both PCAs primarily supplied via the basilar. Left PCA widely patent and well perfused to its distal aspect. There is apparent occlusion of the proximal right P2 segment (series 1054, image 12). No significant flow within the right PCA distribution distally evident by MRA. No intracranial aneurysm. MRA NECK FINDINGS AORTIC ARCH: Visualized aortic arch of normal caliber with normal 3 vessel morphology. No hemodynamically significant stenosis seen about the origin of the great vessels. RIGHT CAROTID SYSTEM: Right common and internal carotid arteries widely patent without stenosis, evidence for dissection, or occlusion. No significant atheromatous irregularity or narrowing about the right carotid bifurcation. LEFT CAROTID SYSTEM: Left common and internal carotid arteries widely patent without stenosis, evidence for dissection or occlusion. No significant atheromatous irregularity or narrowing about the left carotid bifurcation. VERTEBRAL ARTERIES: Both vertebral arteries arise from the subclavian arteries. Strongly dominant left vertebral artery with a diffusely hypoplastic right vertebral artery. Vertebral arteries widely patent within the neck without stenosis, evidence for dissection, or occlusion. IMPRESSION: MRI HEAD IMPRESSION: 1. 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 2. Underlying mild chronic microvascular ischemic disease for age. MRA HEAD IMPRESSION: 1. Proximal right P2 occlusion, consistent with the acute right PCA territory infarct. There may be an underlying chronic stenosis at this location given the relative small size of the infarct. 2. Otherwise normal intracranial MRA. No other large vessel occlusion, hemodynamically significant stenosis, or other acute vascular  abnormality. MRA NECK IMPRESSION: Normal MRA of the neck. Electronically Signed   By: Rise Mu M.D.   On: 04/17/2020 22:37   MR BRAIN WO CONTRAST  Result Date: 04/17/2020 CLINICAL DATA:  Initial evaluation for acute neuro deficit, acute stroke suspected. Transient numbness and vibratory sensation involving the left-sided body. Headache. EXAM: MRI HEAD WITHOUT CONTRAST MRA HEAD WITHOUT CONTRAST MRA NECK WITHOUT AND WITH CONTRAST TECHNIQUE: Multiplanar, multiecho pulse sequences of the brain and surrounding structures were obtained without intravenous contrast. Angiographic images of the Circle of Willis were obtained using MRA technique without intravenous contrast. Angiographic images of the neck were obtained using MRA technique without and with intravenous contrast. Carotid stenosis measurements (when applicable) are obtained utilizing NASCET criteria, using the distal internal carotid diameter as the denominator. CONTRAST:  5.23mL GADAVIST GADOBUTROL 1 MMOL/ML IV SOLN COMPARISON:  Prior head CT from earlier the same day. FINDINGS: MRI HEAD FINDINGS Brain: Cerebral volume within normal limits for patient age. Mild patchy T2/FLAIR hyperintensity seen within the periventricular white matter and pons, most likely related chronic small vessel ischemic disease, mild in nature. Probable small remote lacunar infarct noted at the left midbrain (series 16, image 23). 6 mm focus of restricted diffusion seen involving the cortical gray matter of the right occipital lobe, consistent with an acute ischemic infarct (series 5, image 73), right PCA distribution. No associated hemorrhage or mass effect. No other diffusion abnormality to suggest acute or subacute ischemia. Gray-white matter differentiation otherwise maintained. No encephalomalacia to suggest chronic cortical infarction elsewhere within the brain. No foci of susceptibility artifact to suggest acute or chronic intracranial hemorrhage. No mass lesion,  midline shift or mass effect. No hydrocephalus. No extra-axial fluid collection. Major dural sinuses are grossly patent. Pituitary gland and suprasellar region are normal. Midline structures intact and normal. Vascular: Major intracranial vascular flow voids well  maintained and normal in appearance. Skull and upper cervical spine: Craniocervical junction normal. Visualized upper cervical spine within normal limits. Bone marrow signal intensity normal. No scalp soft tissue abnormality. Sinuses/Orbits: Globes and orbital soft tissues within normal limits. Minimal mucosal thickening noted within the ethmoidal air cells. Paranasal sinuses are otherwise clear. No mastoid effusion. Inner ear structures grossly normal. Other: None. MRA HEAD FINDINGS ANTERIOR CIRCULATION: Visualized distal cervical segments of the internal carotid arteries are patent with symmetric antegrade flow. Petrous, cavernous, and supraclinoid segments widely patent without stenosis or other abnormality. A1 segments patent bilaterally. Normal anterior communicating artery complex. Anterior cerebral arteries patent to their distal aspects without stenosis. No M1 stenosis or occlusion. Normal MCA bifurcations. Distal MCA branches well perfused and symmetric. POSTERIOR CIRCULATION: Dominant left V4 segment widely patent without stenosis. Patent left PICA origin. Right V4 segment diffusely hypoplastic but patent as well. Right PICA not seen. Basilar patent to its distal aspect without stenosis. Superior cerebral arteries patent bilaterally. Both PCAs primarily supplied via the basilar. Left PCA widely patent and well perfused to its distal aspect. There is apparent occlusion of the proximal right P2 segment (series 1054, image 12). No significant flow within the right PCA distribution distally evident by MRA. No intracranial aneurysm. MRA NECK FINDINGS AORTIC ARCH: Visualized aortic arch of normal caliber with normal 3 vessel morphology. No  hemodynamically significant stenosis seen about the origin of the great vessels. RIGHT CAROTID SYSTEM: Right common and internal carotid arteries widely patent without stenosis, evidence for dissection, or occlusion. No significant atheromatous irregularity or narrowing about the right carotid bifurcation. LEFT CAROTID SYSTEM: Left common and internal carotid arteries widely patent without stenosis, evidence for dissection or occlusion. No significant atheromatous irregularity or narrowing about the left carotid bifurcation. VERTEBRAL ARTERIES: Both vertebral arteries arise from the subclavian arteries. Strongly dominant left vertebral artery with a diffusely hypoplastic right vertebral artery. Vertebral arteries widely patent within the neck without stenosis, evidence for dissection, or occlusion. IMPRESSION: MRI HEAD IMPRESSION: 1. 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 2. Underlying mild chronic microvascular ischemic disease for age. MRA HEAD IMPRESSION: 1. Proximal right P2 occlusion, consistent with the acute right PCA territory infarct. There may be an underlying chronic stenosis at this location given the relative small size of the infarct. 2. Otherwise normal intracranial MRA. No other large vessel occlusion, hemodynamically significant stenosis, or other acute vascular abnormality. MRA NECK IMPRESSION: Normal MRA of the neck. Electronically Signed   By: Rise Mu M.D.   On: 04/17/2020 22:37   VAS [place] TRANSCRANIAL DOPPLER W BUBBLES  Result Date: 04/19/2020  Transcranial Doppler with Bubble Indications: Stroke. Comparison Study: No prior study Performing Technologist: Gertie Fey MHA, RDMS, RVT, RDCS  Examination Guidelines: A complete evaluation includes B-mode imaging, spectral Doppler, color Doppler, and power Doppler as needed of all accessible portions of each vessel. Bilateral testing is considered an integral  part of a complete examination. Limited examinations for reoccurring indications may be performed as noted.  Summary: No HITS at rest or during Valsalva. Negative transcranial Doppler Bubble study with no evidence of right to left intracardiac communication.  A vascular evaluation was performed. The right middle cerebral artery was studied. An IV was inserted into the patient's right forearm. Verbal informed consent was obtained.  Negative TCD Bubble study *See table(s) above for TCD measurements and observations.  Diagnosing physician: Delia Heady MD Electronically signed by Delia Heady MD on 04/19/2020 at 12:19:08  PM.    Final    ECHOCARDIOGRAM COMPLETE  Result Date: 04/18/2020    ECHOCARDIOGRAM REPORT   Patient Name:   PATRISHA HAUSMANN Date of Exam: 04/18/2020 Medical Rec #:  [id number]     Height:       62.5 in Accession #:    [id number]    Weight:       129.9 lb Date of Birth:  11/10/1955     BSA:          1.601 m Patient Age:    64 years      BP:           164/86 mmHg Patient Gender: F             HR:           58 bpm. Exam Location:  Inpatient Procedure: 2D Echo, Cardiac Doppler and Color Doppler Indications:    Stroke 434.91 / I163.9  History:        Patient has no prior history of Echocardiogram examinations.                 Signs/Symptoms:Murmur.  Sonographer:    Elmarie Shiley Dance Referring Phys: [id number] Cecille Po MELVIN IMPRESSIONS  1. Left ventricular ejection fraction, by estimation, is 50 to 55%. The left ventricle has low normal function. The left ventricle has no regional wall motion abnormalities. There is mild left ventricular hypertrophy. Left ventricular diastolic parameters are consistent with Grade I diastolic dysfunction (impaired relaxation).  2. Right ventricular systolic function is normal. The right ventricular size is normal. There is normal pulmonary artery systolic pressure. The estimated right ventricular systolic pressure is 20.1 mmHg.  3. The mitral valve is normal in structure.  Trivial mitral valve regurgitation.  4. The aortic valve is tricuspid. Aortic valve regurgitation is not visualized. No aortic stenosis is present. FINDINGS  Left Ventricle: Left ventricular ejection fraction, by estimation, is 50 to 55%. The left ventricle has low normal function. The left ventricle has no regional wall motion abnormalities. The left ventricular internal cavity size was normal in size. There is mild left ventricular hypertrophy. Left ventricular diastolic parameters are consistent with Grade I diastolic dysfunction (impaired relaxation). Right Ventricle: The right ventricular size is normal. No increase in right ventricular wall thickness. Right ventricular systolic function is normal. There is normal pulmonary artery systolic pressure. The tricuspid regurgitant velocity is 2.07 m/s, and  with an assumed right atrial pressure of 3 mmHg, the estimated right ventricular systolic pressure is 20.1 mmHg. Left Atrium: Left atrial size was normal in size. Right Atrium: Right atrial size was normal in size. Pericardium: Trivial pericardial effusion is present. Mitral Valve: The mitral valve is normal in structure. Trivial mitral valve regurgitation. Tricuspid Valve: The tricuspid valve is normal in structure. Tricuspid valve regurgitation is trivial. Aortic Valve: The aortic valve is tricuspid. Aortic valve regurgitation is not visualized. No aortic stenosis is present. Pulmonic Valve: The pulmonic valve was not well visualized. Pulmonic valve regurgitation is not visualized. Aorta: The aortic root and ascending aorta are structurally normal, with no evidence of dilitation. IAS/Shunts: The interatrial septum was not well visualized.  LEFT VENTRICLE PLAX 2D LVIDd:         4.80 cm  Diastology LVIDs:         3.50 cm  LV e' medial:    3.05 cm/s LV PW:         1.10 cm  LV E/e' medial:  13.5 LV IVS:  0.80 cm  LV e' lateral:   4.66 cm/s LVOT diam:     1.80 cm  LV E/e' lateral: 8.8 LV SV:         43 LV SV  Index:   27 LVOT Area:     2.54 cm  RIGHT VENTRICLE             IVC RV Basal diam:  2.60 cm     IVC diam: 1.60 cm RV S prime:     17.40 cm/s TAPSE (M-mode): 1.7 cm LEFT ATRIUM             Index       RIGHT ATRIUM          Index LA diam:        3.60 cm 2.25 cm/m  RA Area:     9.37 cm LA Vol (A2C):   45.5 ml 28.42 ml/m RA Volume:   16.10 ml 10.06 ml/m LA Vol (A4C):   31.4 ml 19.61 ml/m LA Biplane Vol: 37.0 ml 23.11 ml/m  AORTIC VALVE LVOT Vmax:   72.45 cm/s LVOT Vmean:  53.000 cm/s LVOT VTI:    0.170 m  AORTA Ao Root diam: 2.70 cm Ao Asc diam:  3.20 cm MITRAL VALVE               TRICUSPID VALVE MV Area (PHT): 3.12 cm    TR Peak grad:   17.1 mmHg MV Decel Time: 243 msec    TR Vmax:        207.00 cm/s MV E velocity: 41.10 cm/s MV A velocity: 90.00 cm/s  SHUNTS MV E/A ratio:  0.46        Systemic VTI:  0.17 m                            Systemic Diam: 1.80 cm Epifanio Lescheshristopher Schumann MD Electronically signed by Epifanio Lescheshristopher Schumann MD Signature Date/Time: 04/18/2020/5:54:18 PM    Final    VAS US LOWER EXTREMITY VENOUS (DVT)  Result Date: 04/18/2020  Lower Venous DVT Study Indications: Stroke.  Comparison Study: No prior study Performing Technologist: Gertie FeyMichelle Simonetti MHA, RDMS, RVT, RDCS  Examination Guidelines: A complete evaluation includes B-mode imaging, spectral Doppler, color Doppler, and power Doppler as needed of all accessible portions of each vessel. Bilateral testing is considered an integral part of a complete examination. Limited examinations for reoccurring indications may be performed as noted. The reflux portion of the exam is performed with the patient in reverse Trendelenburg.  +---------+---------------+---------+-----------+----------+--------------+ RIGHT    CompressibilityPhasicitySpontaneityPropertiesThrombus Aging +---------+---------------+---------+-----------+----------+--------------+ CFV      Full           Yes      Yes                                  +---------+---------------+---------+-----------+----------+--------------+ SFJ      Full                                                        +---------+---------------+---------+-----------+----------+--------------+ FV Prox  Full                                                        +---------+---------------+---------+-----------+----------+--------------+  FV Mid   Full                                                        +---------+---------------+---------+-----------+----------+--------------+ FV DistalFull                                                        +---------+---------------+---------+-----------+----------+--------------+ PFV      Full                                                        +---------+---------------+---------+-----------+----------+--------------+ POP      Full           Yes      Yes                                 +---------+---------------+---------+-----------+----------+--------------+ PTV      Full                                                        +---------+---------------+---------+-----------+----------+--------------+ PERO     Full                                                        +---------+---------------+---------+-----------+----------+--------------+   +---------+---------------+---------+-----------+----------+--------------+ LEFT     CompressibilityPhasicitySpontaneityPropertiesThrombus Aging +---------+---------------+---------+-----------+----------+--------------+ CFV      Full           Yes      Yes                                 +---------+---------------+---------+-----------+----------+--------------+ SFJ      Full                                                        +---------+---------------+---------+-----------+----------+--------------+ FV Prox  Full                                                         +---------+---------------+---------+-----------+----------+--------------+ FV Mid   Full                                                        +---------+---------------+---------+-----------+----------+--------------+  FV DistalFull                                                        +---------+---------------+---------+-----------+----------+--------------+ PFV      Full                                                        +---------+---------------+---------+-----------+----------+--------------+ POP      Full           Yes      Yes                                 +---------+---------------+---------+-----------+----------+--------------+ PTV      Full                                                        +---------+---------------+---------+-----------+----------+--------------+ PERO     Full                                                        +---------+---------------+---------+-----------+----------+--------------+     Summary: RIGHT: - There is no evidence of deep vein thrombosis in the lower extremity.  - No cystic structure found in the popliteal fossa.  LEFT: - There is no evidence of deep vein thrombosis in the lower extremity.  - No cystic structure found in the popliteal fossa.  *See table(s) above for measurements and observations. Electronically signed by Lemar Livings MD on 04/18/2020 at 4:51:37 PM.    Final     Microbiology: Recent Results (from the past 240 hour(s))  Resp Panel by RT-PCR (Flu A&B, Covid) Nasopharyngeal Swab     Status: None   Collection Time: 04/17/20 11:01 PM   Specimen: Nasopharyngeal Swab; Nasopharyngeal(NP) swabs in vial transport medium  Result Value Ref Range Status   SARS Coronavirus 2 by RT PCR NEGATIVE NEGATIVE Final    Comment: (NOTE) SARS-CoV-2 target nucleic acids are NOT DETECTED.  The SARS-CoV-2 RNA is generally detectable in upper respiratory specimens during the acute phase of infection. The  lowest concentration of SARS-CoV-2 viral copies this assay can detect is 138 copies/mL. A negative result does not preclude SARS-Cov-2 infection and should not be used as the sole basis for treatment or other patient management decisions. A negative result may occur with  improper specimen collection/handling, submission of specimen other than nasopharyngeal swab, presence of viral mutation(s) within the areas targeted by this assay, and inadequate number of viral copies(<138 copies/mL). A negative result must be combined with clinical observations, patient history, and epidemiological information. The expected result is Negative.  Fact Sheet for Patients:  BloggerCourse.com  Fact Sheet for Healthcare Providers:  SeriousBroker.it  This test is no t yet approved or cleared by the [place] and  has been authorized  for detection and/or diagnosis of SARS-CoV-2 by FDA under an Emergency Use Authorization (EUA). This EUA will remain  in effect (meaning this test can be used) for the duration of the COVID-19 declaration under Section 564(b)(1) of the Act, 21 U.S.C.section 360bbb-3(b)(1), unless the authorization is terminated  or revoked sooner.       Influenza A by PCR NEGATIVE NEGATIVE Final   Influenza B by PCR NEGATIVE NEGATIVE Final    Comment: (NOTE) The Xpert Xpress SARS-CoV-2/FLU/RSV plus assay is intended as an aid in the diagnosis of influenza from Nasopharyngeal swab specimens and should not be used as a sole basis for treatment. Nasal washings and aspirates are unacceptable for Xpert Xpress SARS-CoV-2/FLU/RSV testing.  Fact Sheet for Patients: BloggerCourse.com  Fact Sheet for Healthcare Providers: SeriousBroker.it  This test is not yet approved or cleared by the [place] FDA and has been authorized for detection and/or diagnosis of SARS-CoV-2 by FDA under  an Emergency Use Authorization (EUA). This EUA will remain in effect (meaning this test can be used) for the duration of the COVID-19 declaration under Section 564(b)(1) of the Act, 21 U.S.C. section 360bbb-3(b)(1), unless the authorization is terminated or revoked.  Performed at [facility], [address], [place], [place] [address]      Labs: Basic Metabolic Panel: Recent Labs  Lab 04/17/20 1703 04/17/20 1724 04/19/20 0451  NA 140 141 141  K 3.8 3.8 4.1  CL 102 103 105  CO2 29  --  28  GLUCOSE 113* 110* 122*  BUN 18 20 11   CREATININE 0.59 0.50 0.56  CALCIUM 9.4  --  9.2   Liver Function Tests: Recent Labs  Lab 04/17/20 1703  AST 19  ALT 17  ALKPHOS 63  BILITOT 0.5  PROT 6.2*  ALBUMIN 3.8   No results for input(s): LIPASE, AMYLASE in the last 168 hours. No results for input(s): AMMONIA in the last 168 hours. CBC: Recent Labs  Lab 04/17/20 1703 04/17/20 1724 04/19/20 0451  WBC 7.7  --  5.4  NEUTROABS 5.2  --  3.3  HGB 14.4 14.3 14.6  HCT 42.3 42.0 43.4  MCV 92.6  --  91.9  PLT 292  --  261   Cardiac Enzymes: No results for input(s): CKTOTAL, CKMB, CKMBINDEX, TROPONINI in the last 168 hours. BNP: BNP (last 3 results) No results for input(s): BNP in the last 8760 hours.  ProBNP (last 3 results) No results for input(s): PROBNP in the last 8760 hours.  CBG: Recent Labs  Lab 04/17/20 1929  GLUCAP 94       Signed:  Briant Cedar, MD [facility] 04/19/2020, 4:05 PM

## 2020-04-19 NOTE — Interval H&P Note (Signed)
History and Physical Interval Note:  04/19/2020 1:38 PM  Vicki Stevens  has presented today for surgery, with the diagnosis of STROKE.  The various methods of treatment have been discussed with the patient and family. After consideration of risks, benefits and other options for treatment, the patient has consented to  Procedure(s): TRANSESOPHAGEAL ECHOCARDIOGRAM (TEE) (N/A) BUBBLE STUDY as a surgical intervention.  The patient's history has been reviewed, patient examined, no change in status, stable for surgery.  I have reviewed the patient's chart and labs.  Questions were answered to the patient's satisfaction.     Parke Poisson

## 2020-04-19 NOTE — Discharge Instructions (Signed)
You were seen in the emergency department for evaluation of some blurry peripheral vision, headache, left-sided numbness and vibration.  You had blood work and a CAT scan that did not show any significant abnormalities.  Your MRI showed **.  We put in a referral for you to follow-up with neurology.  You should also follow-up with your primary care doctor as your blood pressure was elevated here and will need further management.  Return to the emergency department for any worsening or concerning symptoms.  Care After Your Loop Recorder  . You have a [employer]onic Loop Recorder   . Monitor your cardiac device site for redness, swelling, and drainage. Call the device clinic at [phone] if you experience these symptoms or fever/chills.  . You may shower 3 days after your loop recorder implant and wash your incision with soap and water. Avoid lotions, ointments, or perfumes over your incision until it is well-healed.  . You may use a hot tub or a pool AFTER your wound check appointment if the incision is completely closed.  . Your device is MRI compatible.   . Remote monitoring is used to monitor your cardiac device from home. This monitoring is scheduled every month days by our office. It allows [place] to keep an eye on the functioning of your device to ensure it is working properly.

## 2020-04-19 NOTE — Progress Notes (Signed)
STROKE TEAM PROGRESS NOTE    INTERVAL HISTORY .  Patient is doing well.  She has no complaints.  She is scheduled for TEE later today and if negative loop recorder prior to discharge.  Vital signs are stable.  Neurological exam is unchanged.    OBJECTIVE Vitals:   04/19/20 0333 04/19/20 0818 04/19/20 1200 04/19/20 1337  BP: (!) 145/82 (!) 158/83 (!) 176/85 (!) 198/94  Pulse: (!) 58 (!) 57 (!) 57 72  Resp: 17 18 18 16   Temp: 98.1 F (36.7 C) 98 F (36.7 C) 98.3 F (36.8 C) 99 F (37.2 C)  TempSrc: Oral Oral Oral Oral  SpO2: 96% 95% 100% 100%  Weight:    54.4 kg  Height:    5\' 3"  (1.6 m)    CBC:  Recent Labs  Lab 04/17/20 1703 04/17/20 1724 04/19/20 0451  WBC 7.7  --  5.4  NEUTROABS 5.2  --  3.3  HGB 14.4 14.3 14.6  HCT 42.3 42.0 43.4  MCV 92.6  --  91.9  PLT 292  --  261    Basic Metabolic Panel:  Recent Labs  Lab 04/17/20 1703 04/17/20 1724 04/19/20 0451  NA 140 141 141  K 3.8 3.8 4.1  CL 102 103 105  CO2 29  --  28  GLUCOSE 113* 110* 122*  BUN 18 20 11   CREATININE 0.59 0.50 0.56  CALCIUM 9.4  --  9.2    Lipid Panel:     Component Value Date/Time   CHOL 226 (H) 04/18/2020 0534   TRIG 60 04/18/2020 0534   HDL 81 04/18/2020 0534   CHOLHDL 2.8 04/18/2020 0534   VLDL 12 04/18/2020 0534   LDLCALC 133 (H) 04/18/2020 0534   HgbA1c:  Lab Results  Component Value Date   HGBA1C 5.3 04/18/2020   Urine Drug Screen:     Component Value Date/Time   LABOPIA NONE DETECTED 04/18/2020 1217   COCAINSCRNUR NONE DETECTED 04/18/2020 1217   LABBENZ NONE DETECTED 04/18/2020 1217   AMPHETMU NONE DETECTED 04/18/2020 1217   THCU NONE DETECTED 04/18/2020 1217   LABBARB NONE DETECTED 04/18/2020 1217    Alcohol Level No results found for: ETH  IMAGING  CT HEAD WO CONTRAST 04/17/2020 IMPRESSION:  Normal exam.   MR BRAIN WO CONTRAST MR ANGIO HEAD WO CONTRAST MR Angiogram Neck W or Wo Contrast 04/17/2020  MRI HEAD  IMPRESSION:  1. 6 mm acute ischemic  nonhemorrhagic cortical infarct involving the right occipital lobe, right PCA distribution.  2. Underlying mild chronic microvascular ischemic disease for age.   MRA HEAD  IMPRESSION:  1. Proximal right P2 occlusion, consistent with the acute right PCA territory infarct. There may be an underlying chronic stenosis at this location given the relative small size of the infarct.  2. Otherwise normal intracranial MRA. No other large vessel occlusion, hemodynamically significant stenosis, or other acute vascular abnormality.   MRA NECK  IMPRESSION:  Normal MRA of the neck.   Transthoracic Echocardiogram  00/00/2021 Pending  ECG - SR rate 64 BPM. (See cardiology reading for complete details)  PHYSICAL EXAM Blood pressure (!) 198/94, pulse 72, temperature 99 F (37.2 C), temperature source Oral, resp. rate 16, height 5\' 3"  (1.6 m), weight 54.4 kg, SpO2 100 %. Pleasant middle-age Caucasian lady not in distress. . Afebrile. Head is nontraumatic. Neck is supple without bruit.    Cardiac exam no murmur or gallop. Lungs are clear to auscultation. Distal pulses are well felt. Neurological Exam ;  Awake  Alert oriented x 3. Normal speech and language.eye movements full without nystagmus.fundi were not visualized. Vision acuity and fields appear normal. Hearing is normal. Palatal movements are normal. Face symmetric. Tongue midline. Normal strength, tone, reflexes and coordination. Normal sensation. Gait deferred.     ASSESSMENT/PLAN [name] is a 64 y.o. female who is physically fit with borderline HTN and remote hx of MVP presenting with an episode of left-sided numbness followed by vibration sensation which lasted ~2 minutes 04/17/2020 as well as ~1 week of waxing and waning headache and 4 episodes of intermittent  left peripheral visual changes which lasted ~1 minute and resolved completely. Also of note pt reports episodes of unusually strong heartbeat which happens randomly without  provocation even while sitting quietly. She did not receive IV t-PA due to NIHSS - 0 and late presentation (>4.5 hours from time of onset).   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  Resultant no deficits   code Stroke CT Head - not ordered  CT head - Normal exam.  MRI head - 6 mm acute ischemic nonhemorrhagic cortical infarct involving the right occipital lobe, right PCA distribution. Underlying mild chronic microvascular ischemic disease for age.   MRA head - Proximal right P2 occlusion, consistent with the acute right PCA territory infarct. There may be an underlying chronic stenosis at this location given the relative small size of the infarct.   MRA neck - Normal  CTA H&N - not ordered  CT Perfusion - not ordered  Carotid Doppler - MRA neck ordered - carotid dopplers not indicated  2D Echo - pending  Sars Corona Virus 2 - negative  LDL - pending  HgbA1c - pending  UDS - not ordered  VTE prophylaxis - Lovenox 40 mg daily Diet  Diet Order            Diet NPO time specified  Diet effective now           Diet - low sodium heart healthy                 No antithrombotic prior to admission, now on aspirin 300 mg suppository daily  Patient counseled to be compliant with her antithrombotic medications  Ongoing aggressive stroke risk factor management  Therapy recommendations: No follow-up necessary Disposition: Home Hypertension  Home BP meds: Lisinopril  Current BP meds: none   Blood pressure somewhat high at times but within post stroke/TIA parameters . Permissive hypertension (OK if < 220/120) but gradually normalize in 5-7 days  . Long-term BP goal normotensive  Hyperlipidemia  Home Lipid lowering medication: none   LDL 133 mg percent goal < 70  Current lipid lowering medication: Lipitor 40 mg daily   Continue statin at discharge  Other  Stroke Risk Factors  Advanced age  ETOH use, advised to drink no more than 1 alcoholic beverage per day.  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  Recommend continue ongoing stroke work-up check echocardiogram and if negative TEE and loop recorder prior to discharge vasculitic and hypercoagulable labs.  TCD bubble study for PFO.  Aspirin and Plavix for 3 weeks followed by aspirin alone.  Long discussion the patient and answered questions. Discussed with Dr. Sharolyn Douglas.  And  Dr. Lalla Brothers.  Greater than 50% time during the 25-minute visit was spent on counseling and coordination of care about her embolic stroke and answering questions and discussion with care team.  Follow-up as an outpatient stroke clinic in 6 weeks. Delia Heady, MD To contact Stroke Continuity provider, please refer to [email or web address]. After hours, contact General Neurology

## 2020-04-19 NOTE — Transfer of Care (Signed)
Immediate Anesthesia Transfer of Care Note  Patient: JENNYFER NICKOLSON  Procedure(s) Performed: TRANSESOPHAGEAL ECHOCARDIOGRAM (TEE) (N/A ) BUBBLE STUDY  Patient Location: Endoscopy Unit  Anesthesia Type:MAC  Level of Consciousness: drowsy  Airway & Oxygen Therapy: Patient Spontanous Breathing and Patient connected to nasal cannula oxygen  Post-op Assessment: Report given to RN and Post -op Vital signs reviewed and stable  Post vital signs: Reviewed and stable  Last Vitals:  Vitals Value Taken Time  BP    Temp    Pulse 64 04/19/20 1426  Resp 11 04/19/20 1426  SpO2 98 % 04/19/20 1426  Vitals shown include unvalidated device data.  Last Pain:  Vitals:   04/19/20 1337  TempSrc: Oral  PainSc: 0-No pain         Complications: No complications documented.

## 2020-04-19 NOTE — Anesthesia Preprocedure Evaluation (Signed)
Anesthesia Evaluation  Patient identified by MRN, date of birth, ID band Patient awake    Reviewed: Allergy & Precautions, NPO status , Patient's Chart, lab work & pertinent test results  Airway Mallampati: II  TM Distance: >3 FB Neck ROM: Full    Dental no notable dental hx.    Pulmonary neg pulmonary ROS,    Pulmonary exam normal breath sounds clear to auscultation       Cardiovascular Exercise Tolerance: Good hypertension, Normal cardiovascular exam Rhythm:Regular Rate:Normal     Neuro/Psych CVA (crytogenic stroke) negative psych ROS   GI/Hepatic negative GI ROS, Neg liver ROS,   Endo/Other  negative endocrine ROS  Renal/GU negative Renal ROS  negative genitourinary   Musculoskeletal negative musculoskeletal ROS (+)   Abdominal   Peds  Hematology   Anesthesia Other Findings   Reproductive/Obstetrics negative OB ROS                            Anesthesia Physical Anesthesia Plan  ASA: II  Anesthesia Plan: MAC   Post-op Pain Management:    Induction: Intravenous  PONV Risk Score and Plan: 2 and Propofol infusion, TIVA and Treatment may vary due to age or medical condition  Airway Management Planned: Natural Airway and Simple Face Mask  Additional Equipment:   Intra-op Plan:   Post-operative Plan:   Informed Consent: I have reviewed the patients History and Physical, chart, labs and discussed the procedure including the risks, benefits and alternatives for the proposed anesthesia with the patient or authorized representative who has indicated his/her understanding and acceptance.     Dental advisory given  Plan Discussed with: CRNA and Anesthesiologist  Anesthesia Plan Comments:         Anesthesia Quick Evaluation

## 2020-04-20 ENCOUNTER — Encounter (HOSPITAL_COMMUNITY): Payer: Self-pay | Admitting: Cardiology

## 2020-04-20 NOTE — Progress Notes (Signed)
NO ANSWER, UNABLE TO LEAVE MESSAGE 

## 2020-04-22 ENCOUNTER — Encounter (HOSPITAL_COMMUNITY): Payer: Self-pay | Admitting: Internal Medicine

## 2020-04-23 ENCOUNTER — Ambulatory Visit (INDEPENDENT_AMBULATORY_CARE_PROVIDER_SITE_OTHER): Payer: 59 | Admitting: Emergency Medicine

## 2020-04-23 ENCOUNTER — Telehealth: Payer: Self-pay | Admitting: Cardiology

## 2020-04-23 ENCOUNTER — Other Ambulatory Visit: Payer: Self-pay

## 2020-04-23 DIAGNOSIS — I6300 Cerebral infarction due to thrombosis of unspecified precerebral artery: Secondary | ICD-10-CM

## 2020-04-23 NOTE — Patient Instructions (Addendum)
Please call for questions or concerns.  Device Clinic [phone]

## 2020-04-23 NOTE — Progress Notes (Signed)
Edges are well approximated. Steri-strips applied.

## 2020-04-23 NOTE — Telephone Encounter (Signed)
Patient reports of running and noticed her bandage had a lot of sweat under. States she showered and the noticed fluid coming out of the bandage. Patient removed dressing and steri-strips were removed. Patient sent picture, site looks well healed. Patient would like new steri-stripes applied. Offered patient to come into clinic for new steri-strips. Apt. Made 04/23/20 @ 2:00 PM. Consent obtained to attach picture to chart.

## 2020-04-23 NOTE — Telephone Encounter (Signed)
Dr. Lalla Brothers implanted a loop recorder and today was the first day (4 days post op) that she was able to workout and shower. She states that wound is oozing "black watery stuff" and the gauze is black from old blood. She states that the gauze is soaked. She wants to know if this is normal and if she can put a bandage over this. Please advise.

## 2020-04-25 ENCOUNTER — Other Ambulatory Visit: Payer: Self-pay

## 2020-04-25 ENCOUNTER — Encounter: Payer: Self-pay | Admitting: Adult Health

## 2020-04-25 ENCOUNTER — Ambulatory Visit: Payer: 59 | Admitting: Adult Health

## 2020-04-25 VITALS — BP 130/80 | HR 65 | Temp 98.4°F | Resp 14 | Ht 63.0 in | Wt 123.6 lb

## 2020-04-25 DIAGNOSIS — Z8673 Personal history of transient ischemic attack (TIA), and cerebral infarction without residual deficits: Secondary | ICD-10-CM

## 2020-04-25 DIAGNOSIS — E782 Mixed hyperlipidemia: Secondary | ICD-10-CM

## 2020-04-25 DIAGNOSIS — I10 Essential (primary) hypertension: Secondary | ICD-10-CM

## 2020-04-25 DIAGNOSIS — I6300 Cerebral infarction due to thrombosis of unspecified precerebral artery: Secondary | ICD-10-CM

## 2020-04-25 NOTE — Patient Instructions (Signed)
It was great seeing you today!   I am glad you are feeling better. Please continue to monitor your blood pressure at home   Follow up with Dr. Fabian Sharp in 2-3 months for repeat lipid screen

## 2020-04-25 NOTE — Progress Notes (Signed)
Subjective:    Patient ID: Vicki Stevens, female    DOB: 10-Jun-1955, 64 y.o.   MRN: [id number]  HPI 64 year old female who  has a past medical history of H/O hematuria (remote), Heart murmur, varicella, Hypertension, and LOW BACK PAIN, ACUTE (03/05/2010).  She presents to the office today for TCM visit   Admit Date 04/17/2020 Discharge Date 04/19/2020  He presented to the emergency room after an episode of left-sided numbness.  She reported that she had an episode of left-sided numbness with some vibration sensation that lasted about 2 minutes.  Additionally reported that for a week prior she had a waxing and waning headache; 5 days of intermittent episodes of left peripheral vision changes that lasted about 1 minute at a time that occurred on about 4 separate occasions in 5 days.  In the ED her vitals were significant for blood pressure in the 170s to 200s systolic.  Her lab work was unremarkable.  Respiratory panel for flu and Covid were negative.  CT of the head was without acute normality.  MRI of the brain showed an acute CVA in the PCA territory.  MRA showed occlusion of the right P2.  Neurology was consulted and the patient was admitted for further management  Hospital Course   Acute CVA -MRI of the brain showed acute CVA in the PCA territory, MRA showed occlusion of the right P2.  Her echo with an EF of 50 to 55%, no regional wall motion abnormality, grade 1 diastolic dysfunction.  TEE done which showed a normal biventricular size and function, no LAA thrombus, no PFO.  Cardiology/EP was consulted and a loop recorder was placed on 04/19/2020.  She was advised to continue aspirin and Plavix for 3 weeks and then aspirin alone.  Also started on Lipitor.  He was advised to follow-up with neurology for which she has an appointment coming up  Hypertension  - Lisinopril 10 mg started  Hyperlipidemia - Started on Lipitor 40 mg  Lab Results  Component Value Date   CHOL 226 (H) 04/18/2020    HDL 81 04/18/2020   LDLCALC 133 (H) 04/18/2020   TRIG 60 04/18/2020   CHOLHDL 2.8 04/18/2020     Today in the office she reports "I feel great", she denies any deficits status post CVA.  She is taking lisinopril 10 mg and has been monitoring her blood pressure at home over the last 5 days.  Per her log her blood pressures have been between 115 and 165 systolic with most of her blood pressures being in the 120s over 70s to 80s.  He has not started the Lipitor yet and does not want to start this, she would like to work on lifestyle modifications.  Is being discharged from the hospital she has not had any chest pain, shortness of breath, headaches, blurred vision, or other abnormalities.   Review of Systems  Constitutional: Negative.   HENT: Negative.   Eyes: Negative.   Respiratory: Negative.   Cardiovascular: Negative.   Gastrointestinal: Negative.   Endocrine: Negative.   Genitourinary: Negative.   Musculoskeletal: Negative.   Skin: Negative.   Allergic/Immunologic: Negative.   Neurological: Negative.   Hematological: Negative.   Psychiatric/Behavioral: Negative.    Past Medical History:  Diagnosis Date  . H/O hematuria remote   dr Vonita Moss felt to be from running   . Heart murmur    evaluation ? mvp when younger  . Hx of varicella   . Hypertension   .  LOW BACK PAIN, ACUTE 03/05/2010    Social History   Socioeconomic History  . Marital status: Married    Spouse name: Not on file  . Number of children: Not on file  . Years of education: Not on file  . Highest education level: Not on file  Occupational History  . Not on file  Tobacco Use  . Smoking status: Never Smoker  . Smokeless tobacco: Never Used  Substance and Sexual Activity  . Alcohol use: Yes    Alcohol/week: 0.0 standard drinks  . Drug use: Not on file  . Sexual activity: Not on file  Other Topics Concern  . 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 BUBBLE STUDY  04/19/2020   Procedure: BUBBLE STUDY;  Surgeon: Parke Poisson, MD;  Location: [facility] ENDOSCOPY;  Service: Cardiology;;  . LOOP RECORDER INSERTION N/A 04/19/2020   Procedure: LOOP RECORDER INSERTION;  Surgeon: Lanier Prude, MD;  Location: [facility] INVASIVE CV LAB;  Service: Cardiovascular;  Laterality: N/A;  . TEE WITHOUT CARDIOVERSION N/A 04/19/2020   Procedure: TRANSESOPHAGEAL ECHOCARDIOGRAM (TEE);  Surgeon: Parke Poisson, MD;  Location: [facility] ENDOSCOPY;  Service: Cardiology;  Laterality: N/A;    Family History  Problem Relation Age of Onset  . Other Mother        MVA 78   . Lung cancer Father        60    No Known Allergies  Current Outpatient Medications on File Prior to Visit  Medication Sig Dispense Refill  . aspirin EC 81 MG tablet Take 1 tablet (81 mg total) by mouth daily. Swallow whole. 30 tablet 0  . clopidogrel (PLAVIX) 75 MG tablet Take 1 tablet (75 mg total) by mouth daily for 21 days. 21 tablet 0  . lisinopril (ZESTRIL) 10 MG tablet Take 1 tablet (10 mg total) by mouth daily. 30 tablet 0  . Multiple Vitamins-Minerals (ADULT GUMMY PO) Take by mouth.    Marland Kitchen atorvastatin (LIPITOR) 40 MG tablet Take 1 tablet (40 mg total) by mouth daily. (Patient not taking: Reported on 04/25/2020) 30 tablet 0   No current facility-administered medications on file prior to visit.    BP 130/80 (BP Location: Left Arm, Patient Position: Sitting, Cuff Size: Normal)   Pulse 65   Temp 98.4 F (36.9 C) (Oral)   Resp 14   Ht 5\' 3"  (1.6 m)   Wt 123 lb 9.6 oz (56.1 kg)   SpO2 99%    BMI 21.89 kg/m        Objective:   Physical Exam Vitals and nursing note reviewed.  Constitutional:      Appearance: Normal appearance.  Cardiovascular:     Rate and Rhythm: Normal rate and regular rhythm.     Pulses: Normal pulses.     Heart sounds: Normal heart sounds.  Pulmonary:     Effort: Pulmonary effort is normal.     Breath sounds: Normal breath sounds.  Abdominal:     General: Abdomen is flat. Bowel sounds are normal.     Palpations: Abdomen is soft.  Musculoskeletal:        General: Normal range of motion.  Skin:    General: Skin is warm and dry.     Capillary Refill: Capillary refill takes less than 2 seconds.     Findings: Bruising present.     Comments: Bruising noticed on left breast for loop recorder was placed.  Bruise seems to be healing well.  Neurological:     General: No focal deficit present.     Mental Status: She is alert and oriented to person, place, and time.     Cranial Nerves: Cranial nerves are intact.     Sensory: Sensation is intact.     Motor: Motor function is intact.     Coordination: Coordination is intact.     Gait: Gait is intact.  Psychiatric:        Mood and Affect: Mood normal.        Behavior: Behavior normal.        Thought Content: Thought content normal.        Judgment: Judgment normal.       Assessment & Plan:  1. Cerebrovascular accident (CVA) due to thrombosis of precerebral artery [facility]) -Reviewed hospital notes, discharge instructions, imaging, and labs.  All questions answered to the best of my ability.  Her exam today was completely benign, no deficits noted.  She was encouraged and advised to take Lipitor although her HDL is elevated and will help significantly reduce her chance of having another stroke in the future. -Follow-up with neurology as directed  2. Essential hypertension -After 5 days of taking lisinopril her blood pressure has started to even out into the 120s over 80s.  She was advised to continue to  monitor her blood pressure at home with return precautions reviewed  3. Mixed hyperlipidemia -Encouraged to take Lipitor.  She will follow-up with her PCP in 2 to 3 months for repeat lipid profile.  Shirline Frees, NP

## 2020-04-26 NOTE — Progress Notes (Signed)
NEUROLOGY CONSULTATION NOTE  AZALEA CEDAR MRN: [id number] DOB: January 19, 1956  Referring provider: Meridee Score, MD Primary care provider: Berniece Andreas, MD  Reason for consult:  stroke   Subjective:  Vicki Stevens is a 64 year old right-handed female with HTN and remote history of MVP who presents for stroke.  History supplemented by hospital records.CT head and CTA head and neck personally reviewed.  She was admitted to [facility] on 04/17/2020 after experiencing numbness and vibratory sensation of the left side of her body and part of her lips lasting 90 seconds.  She had residual headache.  For about a week prior, she had been experiencing episodes of headacheas well as intermittent distorted peripheral visual disturbance (left side but may have been right-sided) lasting about a minute.  She reports no prior history of headaches.  Occasionally she reports one strong heart beat from time to time.  Systolic blood pressure in the ED was 170s to 200s.  She did not receive IV t-PA due to late presentation and NIHSS 0.  CT of head was normal.  MRI of brain showed 6 mm acute ischemic cortical infarct in the right occipital lobe as well as mild chronic small vessel ischemic changes.  MRA of head and neck showed proximal right P2 occlusion and no extracranial stenosis or occlusion.  2D echocardiogram showed EF 50-55%.  Follow up TEE showed no cardiac source of embolus such as LAA thrombus or PFO.  She had a loop recorder implanted on 04/19/2020.  LDL 133 and Hgb A1c 5.3.  [employer] Virus 2 test and HIV test negative.  She was discharged on atorvastatin 40mg , Plavix and ASA 81mg  daily for 3 weeks followed by ASA alone.   She has not started atorvastatin due to concerns of potential memory loss.  The night she came home from the hospital, and went out to go for a walk with her glow-in-the-dark jacket.  When she got home and took off the jacket, she was still able to see an after-image of the light  off of the jacket for about 5 minutes..  She notes a heightened sense of smell but nothing alarming.  She has history of intermittent Reynaud's and notes that they are occurring more frequently.     PAST MEDICAL HISTORY: Past Medical History:  Diagnosis Date  . H/O hematuria remote   dr felt to be from running   . Heart murmur    evaluation ? mvp when younger  . Hx of varicella   . Hypertension   . LOW BACK PAIN, ACUTE 03/05/2010    PAST SURGICAL HISTORY: Past Surgical History:  Procedure Laterality Date  . BUBBLE STUDY  04/19/2020   Procedure: BUBBLE STUDY;  Surgeon: 03/07/2010, MD;  Location: [facility] ENDOSCOPY;  Service: Cardiology;;  . LOOP RECORDER INSERTION N/A 04/19/2020   Procedure: LOOP RECORDER INSERTION;  Surgeon: [facility], MD;  Location: [facility] INVASIVE CV LAB;  Service: Cardiovascular;  Laterality: N/A;  . TEE WITHOUT CARDIOVERSION N/A 04/19/2020   Procedure: TRANSESOPHAGEAL ECHOCARDIOGRAM (TEE);  Surgeon: [facility], MD;  Location: [facility] ENDOSCOPY;  Service: Cardiology;  Laterality: N/A;    MEDICATIONS: Current Outpatient Medications on File Prior to Visit  Medication Sig Dispense Refill  . aspirin EC 81 MG tablet Take 1 tablet (81 mg total) by mouth daily. Swallow whole. 30 tablet 0  . atorvastatin (LIPITOR) 40 MG tablet Take 1 tablet (40 mg total) by mouth daily. (Patient not taking: Reported on  04/25/2020) 30 tablet 0  . clopidogrel (PLAVIX) 75 MG tablet Take 1 tablet (75 mg total) by mouth daily for 21 days. 21 tablet 0  . lisinopril (ZESTRIL) 10 MG tablet Take 1 tablet (10 mg total) by mouth daily. 30 tablet 0  . Multiple Vitamins-Minerals (ADULT GUMMY PO) Take by mouth.     No current facility-administered medications on file prior to visit.    ALLERGIES: No Known Allergies  FAMILY HISTORY: Family History  Problem Relation Age of Onset  . Other Mother        MVA 28   . Lung cancer Father        61   SOCIAL HISTORY: Social  History   Socioeconomic History  . Marital status: Married    Spouse name: Not on file  . Number of children: Not on file  . Years of education: Not on file  . Highest education level: Not on file  Occupational History  . Not on file  Tobacco Use  . Smoking status: Never Smoker  . Smokeless tobacco: Never Used  Substance and Sexual Activity  . Alcohol use: Yes    Alcohol/week: 0.0 standard drinks  . Drug use: Not on file  . Sexual activity: Not on file  Other Topics Concern  . 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 General: No acute distress.  Patient appears well-groomed.   Head:  Normocephalic/atraumatic Eyes:  fundi examined but not visualized Neck: supple, no paraspinal tenderness, full range of motion Back: No paraspinal tenderness Heart: regular rate and rhythm Lungs: Clear to auscultation bilaterally. Vascular: No carotid bruits. Neurological Exam: Mental status: alert and oriented to person, place, and time, recent and remote memory intact, fund of knowledge intact, attention and concentration intact, speech fluent and not dysarthric, language intact. Cranial nerves: CN I: not tested CN II: pupils equal, round and reactive to light, visual fields intact CN III, IV, VI:  full range of motion, no nystagmus, no ptosis CN V: facial sensation intact. CN VII: upper and lower face symmetric CN VIII: hearing intact CN IX, X: gag intact, uvula midline CN XI:  sternocleidomastoid and trapezius muscles intact CN XII: tongue midline Bulk & Tone: normal, no fasciculations. Motor:  muscle strength 5/5 throughout Sensation:  Pinprick, temperature and vibratory sensation intact. Deep Tendon Reflexes:  2+ throughout,  toes downgoing.   Finger to nose testing:  Without dysmetria.   Heel to shin:  Without dysmetria.   Gait:  Normal station and stride.  Romberg negative.  Assessment/Plan:   1.  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  Given associated palpitations, would continue evaluation for cardiac source. 2.  Hypertension  1.  Continue ASA 81mg  and Plavix 75mg  daily until 21 days after discharge, followed by ASA alone. 2.  Would start atorvastatin as prescribed as LDL goal should  be less than 70 3.  Blood pressure control 4.  Hgb A1c goal less than 7 5.  Ongoing evaluation for a fib with loop recorder 6.  Mediterranean diet 7.  Routine exercise 8.  Follow up with Dr. Pearlean Brownie at the Stroke Clinic    Thank you for allowing me to take part in the care of this patient.  Shon Millet, DO  CC:  Berniece Andreas, MD

## 2020-04-27 ENCOUNTER — Ambulatory Visit (INDEPENDENT_AMBULATORY_CARE_PROVIDER_SITE_OTHER): Payer: 59 | Admitting: Neurology

## 2020-04-27 ENCOUNTER — Encounter: Payer: Self-pay | Admitting: Neurology

## 2020-04-27 ENCOUNTER — Other Ambulatory Visit: Payer: Self-pay

## 2020-04-27 VITALS — BP 122/82 | HR 66 | Ht 63.0 in | Wt 122.0 lb

## 2020-04-27 DIAGNOSIS — I63531 Cerebral infarction due to unspecified occlusion or stenosis of right posterior cerebral artery: Secondary | ICD-10-CM | POA: Diagnosis not present

## 2020-04-27 DIAGNOSIS — I10 Essential (primary) hypertension: Secondary | ICD-10-CM

## 2020-04-27 NOTE — Patient Instructions (Signed)
1.  Continue aspirin and Plavix.  After 21 days, continue aspirin alone 2.  Follow up with Dr. Pearlean Brownie 3.  Follow up with me as needed.

## 2020-05-01 ENCOUNTER — Other Ambulatory Visit: Payer: Self-pay

## 2020-05-01 ENCOUNTER — Ambulatory Visit (INDEPENDENT_AMBULATORY_CARE_PROVIDER_SITE_OTHER): Payer: 59 | Admitting: Emergency Medicine

## 2020-05-01 DIAGNOSIS — I639 Cerebral infarction, unspecified: Secondary | ICD-10-CM

## 2020-05-01 LAB — CUP PACEART INCLINIC DEVICE CHECK
Date Time Interrogation Session: 20211221114954
Implantable Pulse Generator Implant Date: 20211209

## 2020-05-01 NOTE — Patient Instructions (Signed)
Call the Device Clinic with any questions at [phone].

## 2020-05-01 NOTE — Progress Notes (Signed)
ILR wound check in clinic. Steri strips removed. Wound well healed. R-waves 0.84 mV. Remote monitor APP transmitting nightly. 2 false AF episodes that were ST with ectopy. Questions answered.

## 2020-05-03 ENCOUNTER — Telehealth: Payer: Self-pay

## 2020-05-03 NOTE — Telephone Encounter (Signed)
Pt reports ILR implant site is tender, it started last night before bed, it hurts anytime she moved putting pressure on it and has caused pain when she lifts her arm.    Pt denies any redness beyond the bruising that was present post implant.  Pt denies any drain and is unable to discern if there is swelling.  Advised pt can use an ice pack at the site for pain, rest the area/ avoid direct contact.  May apply bacitracin or neosporin if there appears s/s of infection.  Report to MD if develops fever or chills.    Provided office number for pt to call on-call triage if concerns arise over the weekend.

## 2020-05-03 NOTE — Telephone Encounter (Signed)
The pt states her wound site is tender to touch. I let her speak with Amy, rn.

## 2020-05-09 ENCOUNTER — Ambulatory Visit: Payer: 59 | Admitting: Internal Medicine

## 2020-05-09 ENCOUNTER — Telehealth: Payer: Self-pay | Admitting: Internal Medicine

## 2020-05-09 NOTE — Telephone Encounter (Signed)
Sorry you have had a stroke.  Make a  visit in person or video  If concerned about need for order further   lab work  I was out of the office for 2 weeks in December and see that you saw  Kandee Keen  Your last visit with me personally was in 2018    You could also contact cardiology and neurology   team who have seen you more recently to see if they wish to order the lab  in the interim and address your concerns.   The stroke team of dr Pearlean Brownie is  A good resource for help in this area  But I would be happy to "see you " to help out.  You may want to wait until your appointment January 13 with him look into further testing to show risk.

## 2020-05-09 NOTE — Telephone Encounter (Signed)
Patient is calling and requesting a refill for lisinopril (ZESTRIL) 10 MG tablet and aspirin EC 81 MG tablet sent to [id number] -  [address], [place] [address]  Phone:  [phone] Fax:  [phone] CB is [phone]

## 2020-05-10 MED ORDER — LISINOPRIL 10 MG PO TABS: 10.0000 mg | ORAL_TABLET | Freq: Every day | ORAL | 0 refills | Status: DC

## 2020-05-10 MED ORDER — ASPIRIN EC 81 MG PO TBEC: 81.0000 mg | DELAYED_RELEASE_TABLET | Freq: Every day | ORAL | 0 refills | Status: AC

## 2020-05-10 NOTE — Telephone Encounter (Signed)
Refill sent.  Patient was seen by [facility] for hospital follow up since PCP was out of the office.

## 2020-05-10 NOTE — Telephone Encounter (Signed)
It is not routine practice to order B6 B12 and even homocystine and stroke patients and hence I would defer a question to you.  Only young patients may consider homocystine but large studies have not shown that correction of homocystine levels necessary leads to reduction in stroke hence I have stopped checking it routinely

## 2020-05-10 NOTE — Telephone Encounter (Signed)
Pt is calling in stating that she had received a call from the office and was not able to get to the phone.  Pt stated that she does not have further questions just wanted to see if she could get her medications refilled.

## 2020-05-10 NOTE — Telephone Encounter (Signed)
Spoke with the pt and informed her I initially called as the Rxs were not prescribed by Dr Fabian Sharp and it appears these were given at the hospital.  Patient stated the Rxs were given after a stroke and she wanted to know the policy for refills as she has not had to take a medication daily until this point.  I informed the pt the request was sent to Dr Fabian Sharp for review within 48-72 hours, if approved refills will be sent and if not we will contact her with more information.  Message sent to PCP.

## 2020-05-10 NOTE — Telephone Encounter (Signed)
So generally we take over refilling medicine after hospitalization at our post hospital visit. Sounds like things got confused.   Okay to refill both medications lisinopril and aspirin for 1 month.  Then make sure she has a visit with me about medicine managemen post hospital t before she runs out If she is in danger of running out have her contact us again for short-term refill.

## 2020-05-14 ENCOUNTER — Encounter: Payer: Self-pay | Admitting: Internal Medicine

## 2020-05-14 ENCOUNTER — Other Ambulatory Visit: Payer: Self-pay

## 2020-05-14 ENCOUNTER — Ambulatory Visit (INDEPENDENT_AMBULATORY_CARE_PROVIDER_SITE_OTHER): Payer: PRIVATE HEALTH INSURANCE | Admitting: Internal Medicine

## 2020-05-14 VITALS — BP 170/102 | HR 62 | Temp 98.3°F | Ht 63.0 in | Wt 125.4 lb

## 2020-05-14 DIAGNOSIS — Z8673 Personal history of transient ischemic attack (TIA), and cerebral infarction without residual deficits: Secondary | ICD-10-CM

## 2020-05-14 DIAGNOSIS — I10 Essential (primary) hypertension: Secondary | ICD-10-CM

## 2020-05-14 DIAGNOSIS — E782 Mixed hyperlipidemia: Secondary | ICD-10-CM | POA: Insufficient documentation

## 2020-05-14 DIAGNOSIS — Z79899 Other long term (current) drug therapy: Secondary | ICD-10-CM

## 2020-05-14 MED ORDER — LISINOPRIL 10 MG PO TABS: 10.0000 mg | ORAL_TABLET | Freq: Two times a day (BID) | ORAL | 2 refills | Status: DC

## 2020-05-14 NOTE — Progress Notes (Signed)
Chief Complaint  Patient presents with  . Acute Visit    Elevated BP x 4 days.  Average BP 168-100, 166/96.     HPI: Vicki Stevens 65 y.o. come in for BP  Elevation  And sp hosp for cryptogenic stroke   12 7 21   Has had tee evaluation  By cardiology and neuro  Was placed on bp medication lisinopril 10 mg and needs   Refills and managments  Loop recorder implanted bp control over the last week her blood pressure control has been labile in goal range after exercise but sometimes will go up to 1 6170 pulse is always in the 5060 range Statin question: Was placed on statin medicine questions why she would benefit from that with normal to low cholesterols and no history of plaque in her arteries per se.  Negative family history.  She has a follow-up visit with Dr. Leonie Man  stroke specialist in a week or 2  No fam hx of ht   Parents died young .  Father cancer 71  And mom  Not diseaese related .   ROS: See pertinent positives and negatives per HPI.  Past Medical History:  Diagnosis Date  . H/O hematuria remote   dr Terance Hart felt to be from running   . Heart murmur    evaluation ? mvp when younger  . Hx of varicella   . Hypertension   . LOW BACK PAIN, ACUTE 03/05/2010    Family History  Problem Relation Age of Onset  . Other Mother        MVA 24   . Lung cancer Father        19    Social History   Socioeconomic History  . Marital status: Married    Spouse name: Not on file  . Number of children: Not on file  . Years of education: Not on file  . Highest education level: Not on file  Occupational History  . Not on file  Tobacco Use  . Smoking status: Never Smoker  . Smokeless tobacco: Never Used  Vaping Use  . Vaping Use: Never used  Substance and Sexual Activity  . Alcohol use: Yes    Alcohol/week: 0.0 standard drinks  . Drug use: Never  . Sexual activity: Not on file  Other Topics Concern  . 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 aspirin EC 81 MG tablet Take 1 tablet (81 mg total) by mouth daily. Swallow whole. 30 tablet 0  . lisinopril (ZESTRIL) 10 MG tablet Take 1 tablet (10 mg total) by mouth daily. 30 tablet 0  . atorvastatin (LIPITOR) 40 MG tablet Take 1 tablet (40 mg total) by mouth daily. (Patient not taking: No sig reported) 30 tablet 0  . Multiple Vitamins-Minerals (ADULT GUMMY PO) Take by mouth. (Patient not taking: Reported on 05/14/2020)     No facility-administered medications prior to visit.     EXAM:  BP (!) 170/102   Pulse 62   Temp 98.3 F (36.8 C) (Oral)   Ht 5\' 3"  (1.6 m)   Wt 125 lb 6.4 oz (56.9 kg)   SpO2 98%   BMI 22.21 kg/m   Body mass index is 22.21 kg/m. Repeat blood pressure right arm 160/78 sitting left 164/88 sitting   GENERAL: vitals reviewed and listed above, alert, oriented, appears well hydrated and in no acute distress HEENT: atraumatic, conjunctiva  clear, no obvious abnormalities on inspection of external nose and ears OP :masked  NECK: no obvious masses on inspection palpation  LUNGS: clear to auscultation bilaterally, no wheezes, rales or rhonchi, good air movement CV: HRRR, no clubbing cyanosis or  peripheral edema nl cap refill  MS: moves all extremities without noticeable focal  abnormality PSYCH: pleasant and cooperative, no obvious depression or anxiety Lab Results  Component Value Date   WBC 5.4 04/19/2020   HGB 14.6 04/19/2020   HCT 43.4 04/19/2020   PLT 261 04/19/2020   GLUCOSE 122 (H) 04/19/2020   CHOL 226 (H) 04/18/2020   TRIG 60  04/18/2020   HDL 81 04/18/2020   LDLCALC 133 (H) 04/18/2020   ALT 17 04/17/2020   AST 19 04/17/2020   NA 141 04/19/2020   K 4.1 04/19/2020   CL 105 04/19/2020   CREATININE 0.56 04/19/2020   BUN 11 04/19/2020   CO2 28 04/19/2020   TSH 1.01 06/12/2014   INR 1.0 04/17/2020   HGBA1C 5.3 04/18/2020   BP Readings from Last 3 Encounters:  05/14/20 (!) 170/102  04/27/20 122/82  04/25/20 130/80    ASSESSMENT AND PLAN:  Discussed the following assessment and plan:  Essential hypertension - Plan: BASIC METABOLIC PANEL WITH GFR, Lipid panel  History of CVA (cerebrovascular accident) - Plan: BASIC METABOLIC PANEL WITH GFR, Lipid panel  Medication management - Plan: BASIC METABOLIC PANEL WITH GFR, Lipid panel Blood pressure log is reviewed some of her readings seem to be labile and often at goal but understandable her concern of her doing all the healthy lifestyle interventions but yet her blood pressure will spike and has concerned about it. She feels the lisinopril is helpful after she takes it.  And after exercise her blood pressures are in the 120/80 ranges We will increase to lisinopril 10 mg twice daily at this time discussed potential consider work-up for secondary causes of labile hypertension and or add other medications for better control. She can discuss statin use with neurology but discussed secondary prevention trials for populations and decide later about whether she will remain on these medicine.  She has concerned about dementia but discussed that that was not an incredible evidence of concern.  At this time. She will get back with 04/27/20 about how her blood pressure is doing in a month and plan labs later. -Patient advised to return or notify health care team  if  new concerns arise.  Patient Instructions  increase the lisinopril to  10 mg twice a day   Consider evaluation for other causes   And may need combo medications to control  Blood pressure perfectly Continue  lifestyle intervention healthy eating and exercise .   Plan lab in about a month and f/ u readings  Virtual ok   . Then go from there.      [place]. Aydan Levitz M.D. Hospital review and discharge MR brain showed acute CVA in the PCA territory, MRA showed occlusion of the right P2 A1c 5.3, LDL 133 Echo with  EF of 50 to 55%, no regional wall motion abnormality, grade 1 diastolic dysfunction TEE done showed normal biventricular size and function, no LAA thrombus, no PFO Cardiology/EP on board, loop recorder placed on 04/19/2020 PT/OT/SLP-no further rec Continue ASA andPlavix for 3 weeks, then aspirin alone, continue Lipitor Follow-up with neurology  Hypertension Restart home lisinopril  Hyperlipidemia Continue Lipitor

## 2020-05-14 NOTE — Patient Instructions (Signed)
increase the lisinopril to  10 mg twice a day   Consider evaluation for other causes   And may need combo medications to control  Blood pressure perfectly Continue lifestyle intervention healthy eating and exercise .   Plan lab in about a month and f/ u readings  Virtual ok   . Then go from there.

## 2020-05-15 NOTE — Progress Notes (Signed)
Vicki Stevens,  Thanks for the [employer]. Her TC was 223 and LDL 123 both are considered high for stroke prevention with LDL target being below 70 so statin is appropriate. BP goal should be normotensive in 2 weeks post stroke if she is still in 150-160 systolic range she needs BP meds as well.

## 2020-05-23 ENCOUNTER — Ambulatory Visit (INDEPENDENT_AMBULATORY_CARE_PROVIDER_SITE_OTHER): Payer: PRIVATE HEALTH INSURANCE

## 2020-05-23 DIAGNOSIS — I6300 Cerebral infarction due to thrombosis of unspecified precerebral artery: Secondary | ICD-10-CM | POA: Diagnosis not present

## 2020-05-24 ENCOUNTER — Encounter: Payer: Self-pay | Admitting: Neurology

## 2020-05-24 ENCOUNTER — Ambulatory Visit (INDEPENDENT_AMBULATORY_CARE_PROVIDER_SITE_OTHER): Payer: PRIVATE HEALTH INSURANCE | Admitting: Neurology

## 2020-05-24 ENCOUNTER — Other Ambulatory Visit: Payer: Self-pay

## 2020-05-24 VITALS — BP 139/88 | HR 82 | Ht 63.0 in | Wt 123.6 lb

## 2020-05-24 DIAGNOSIS — I639 Cerebral infarction, unspecified: Secondary | ICD-10-CM

## 2020-05-24 LAB — CUP PACEART REMOTE DEVICE CHECK
Date Time Interrogation Session: 20220112145541
Implantable Pulse Generator Implant Date: 20211209

## 2020-05-24 NOTE — Patient Instructions (Signed)
I had a long d/w patient about her recent cryptogenic stroke, risk for recurrent stroke/TIAs, personally independently reviewed imaging studies and stroke evaluation results and answered questions.Continue aspirin 81 mg daily  for secondary stroke prevention and maintain strict control of hypertension with blood pressure goal below 130/90, diabetes with hemoglobin A1c goal below 6.5% and lipids with LDL cholesterol goal below 70 mg/dL. I also advised the patient to eat a healthy diet with plenty of whole grains, cereals, fruits and vegetables, exercise regularly and maintain ideal body weight I strongly encouraged her to consider starting a statin to obtain optimal cholesterol control but she wants to wait and have repeat lipid profile checked in a few weeks as she is on a low-fat diet.  She may also consider possible participation in the [place] trial for stroke prevention if interested and will be given information to review and decide..Followup in the future with my nurse practitioner Shanda Bumps in 3 months or call earlier if necessary.  Stroke Prevention Some medical conditions and behaviors are associated with a higher chance of having a stroke. You can help prevent a stroke by making nutrition, lifestyle, and other changes, including managing any medical conditions you may have. What nutrition changes can be made?  Eat healthy foods. You can do this by: ? Choosing foods high in fiber, such as fresh fruits and vegetables and whole grains. ? Eating at least 5 or more servings of fruits and vegetables a day. Try to fill half of your plate at each meal with fruits and vegetables. ? Choosing lean protein foods, such as lean cuts of meat, poultry without skin, fish, tofu, beans, and nuts. ? Eating low-fat dairy products. ? Avoiding foods that are high in salt (sodium). This can help lower blood pressure. ? Avoiding foods that have saturated fat, trans fat, and cholesterol. This can help prevent high  cholesterol. ? Avoiding processed and premade foods.  Follow your health care provider's specific guidelines for losing weight, controlling high blood pressure (hypertension), lowering high cholesterol, and managing diabetes. These may include: ? Reducing your daily calorie intake. ? Limiting your daily sodium intake to 1,500 milligrams (mg). ? Using only healthy fats for cooking, such as olive oil, canola oil, or sunflower oil. ? Counting your daily carbohydrate intake.   What lifestyle changes can be made?  Maintain a healthy weight. Talk to your health care provider about your ideal weight.  Get at least 30 minutes of moderate physical activity at least 5 days a week. Moderate activity includes brisk walking, biking, and swimming.  Do not use any products that contain nicotine or tobacco, such as cigarettes and e-cigarettes. If you need help quitting, ask your health care provider. It may also be helpful to avoid exposure to secondhand smoke.  Limit alcohol intake to no more than 1 drink a day for nonpregnant women and 2 drinks a day for men. One drink equals 12 oz of beer, 5 oz of wine, or 1 oz of hard liquor.  Stop any illegal drug use.  Avoid taking birth control pills. Talk to your health care provider about the risks of taking birth control pills if: ? You are over 46 years old. ? You smoke. ? You get migraines. ? You have ever had a blood clot. What other changes can be made?  Manage your cholesterol levels. ? Eating a healthy diet is important for preventing high cholesterol. If cholesterol cannot be managed through diet alone, you may also need to take medicines. ?  Take any prescribed medicines to control your cholesterol as told by your health care provider.  Manage your diabetes. ? Eating a healthy diet and exercising regularly are important parts of managing your blood sugar. If your blood sugar cannot be managed through diet and exercise, you may need to take  medicines. ? Take any prescribed medicines to control your diabetes as told by your health care provider.  Control your hypertension. ? To reduce your risk of stroke, try to keep your blood pressure below 130/80. ? Eating a healthy diet and exercising regularly are an important part of controlling your blood pressure. If your blood pressure cannot be managed through diet and exercise, you may need to take medicines. ? Take any prescribed medicines to control hypertension as told by your health care provider. ? Ask your health care provider if you should monitor your blood pressure at home. ? Have your blood pressure checked every year, even if your blood pressure is normal. Blood pressure increases with age and some medical conditions.  Get evaluated for sleep disorders (sleep apnea). Talk to your health care provider about getting a sleep evaluation if you snore a lot or have excessive sleepiness.  Take over-the-counter and prescription medicines only as told by your health care provider. Aspirin or blood thinners (antiplatelets or anticoagulants) may be recommended to reduce your risk of forming blood clots that can lead to stroke.  Make sure that any other medical conditions you have, such as atrial fibrillation or atherosclerosis, are managed. What are the warning signs of a stroke? The warning signs of a stroke can be easily remembered as BEFAST.  B is for balance. Signs include: ? Dizziness. ? Loss of balance or coordination. ? Sudden trouble walking.  E is for eyes. Signs include: ? A sudden change in vision. ? Trouble seeing.  F is for face. Signs include: ? Sudden weakness or numbness of the face. ? The face or eyelid drooping to one side.  A is for arms. Signs include: ? Sudden weakness or numbness of the arm, usually on one side of the body.  S is for speech. Signs include: ? Trouble speaking (aphasia). ? Trouble understanding.  T is for time. ? These symptoms may  represent a serious problem that is an emergency. Do not wait to see if the symptoms will go away. Get medical help right away. Call your local emergency services (911 in the [place] Do not drive yourself to the hospital.  Other signs of stroke may include: ? A sudden, severe headache with no known cause. ? Nausea or vomiting. ? Seizure. Where to find more information For more information, visit:  American Stroke Association: www.strokeassociation.org  National Stroke Association: www.stroke.org Summary  You can prevent a stroke by eating healthy, exercising, not smoking, limiting alcohol intake, and managing any medical conditions you may have.  Do not use any products that contain nicotine or tobacco, such as cigarettes and e-cigarettes. If you need help quitting, ask your health care provider. It may also be helpful to avoid exposure to secondhand smoke.  Remember BEFAST for warning signs of stroke. Get help right away if you or a loved one has any of these signs. This information is not intended to replace advice given to you by your health care provider. Make sure you discuss any questions you have with your health care provider. Document Revised: 04/10/2017 Document Reviewed: 06/03/2016 Elsevier Patient Education  2021 [employer].

## 2020-05-24 NOTE — Progress Notes (Signed)
[facility] [address] [place].  [address] [phone]       OFFICE FOLLOW-UP NOTE  Ms. Virgel Manifold Date of Birth:  1956/03/18 Medical Record Number:  [id number]   HPI: Vicki Stevens is a 65 year old Caucasian lady seen today for initial office follow-up visit following hospital consultation for stroke in December 2021.  She has no significant past medical history except borderline hypertension who presented on 04/18/2020 to [facility] with a transient episode of left-sided numbness followed by vibration sensation which lasted around 2 minutes on 04/17/2020.  She had a waxing and waning headache for 1 week preceding this and 4 episodes of intermittent left-sided peripheral visual changes lasting around a minute which had resolved completely.  Upon initial evaluation she was thought to have possibly complicated migraine but MRI scan showed a 6 mm nonhemorrhagic right occipital lobe infarct in the right PCA distribution with underlying mild changes of small vessel disease.  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  MRA of the neck showed no significant extracranial stenosis.  2D echo showed normal ejection fraction and TEE showed no cardiac source of embolism, PFO or clot.  Transcranial Doppler bubble study was negative for right to left shunt.  Lower extremity venous Dopplers were negative for DVT LDL cholesterol was elevated 133 mg percent and hemoglobin A1c 5.3.  Patient was started on aspirin and Plavix for 3 weeks and is currently on aspirin alone.  She states she has done well and has not had not had any recurrent stroke or TIA symptoms.  She also had a loop recorder inserted and so far paroxysmal A. fib has not yet been found.  She was seen by Dr. Everlena Cooper and referred to see me for stroke follow-up as well.  She is has discontinued Lipitor as she felt cholesterol was not high enough.  She has discussed this with the primary care physician and  feels she is going on low-fat diet and wants to wait till she has repeat lipid profile checked in a few weeks before she decides if she wants to go on statin.  Her blood pressure medications have recently been increased as her blood pressure has been fluctuating and today it is 139/88 in the office.  She takes Zestril twice daily.  She has no new complaints.  She denies any prior history of DVT, pulmonary embolism, recurrent miscarriages or family history of strokes or heart attacks at a young age. ROS:   14 system review of systems is positive for numbness, tingling and all other systems negative  PMH:  Past Medical History:  Diagnosis Date  . H/O hematuria remote   dr Vonita Moss felt to be from running   . Heart murmur    evaluation ? mvp when younger  . Hx of varicella   . Hypertension   . LOW BACK PAIN, ACUTE 03/05/2010  . Stroke [facility]) 04/17/2020    Social History:  Social History   Socioeconomic History  . Marital status: Married    Spouse name: Reino Kent  . Number of children: Not on file  . Years of education: Not on file  . Highest education level: Not on file  Occupational History  . Occupation: part time/self employed  Tobacco Use  . Smoking status: Never Smoker  . Smokeless tobacco: Never Used  Vaping Use  . Vaping Use: Never used  Substance and Sexual Activity  . Alcohol use: Yes    Alcohol/week: 0.0 standard  drinks  . Drug use: Never  . Sexual activity: Not on file  Other Topics Concern  . Not on file  Social History Narrative   Lives with Husband   caffeine 2-3 in am ocass    Right handed   Social Determinants of Health   Financial Resource Strain: Not on file  Food Insecurity: Not on file  Transportation Needs: Not on file  Physical Activity: Not on file  Stress: Not on file  Social Connections: Not on file  Intimate Partner Violence: Not on file    Medications:   Current Outpatient Medications on File Prior to Visit  Medication Sig Dispense  Refill  . aspirin EC 81 MG tablet Take 1 tablet (81 mg total) by mouth daily. Swallow whole. 30 tablet 0  . lisinopril (ZESTRIL) 10 MG tablet Take 1 tablet (10 mg total) by mouth in the morning and at bedtime. 60 tablet 2  . Multiple Vitamins-Minerals (ADULT GUMMY PO) Take by mouth.    Marland Kitchen atorvastatin (LIPITOR) 40 MG tablet Take 1 tablet (40 mg total) by mouth daily. 30 tablet 0   No current facility-administered medications on file prior to visit.    Allergies:  No Known Allergies  Physical Exam General: well developed, well nourished middle-aged Caucasian lady, seated, in no evident distress Head: head normocephalic and atraumatic.  Neck: supple with no carotid or supraclavicular bruits Cardiovascular: regular rate and rhythm, no murmurs Musculoskeletal: no deformity Skin:  no rash/petichiae Vascular:  Normal pulses all extremities Vitals:   05/24/20 1042  BP: 139/88  Pulse: 82   Neurologic Exam Mental Status: Awake and fully alert. Oriented to place and time. Recent and remote memory intact. Attention span, concentration and fund of knowledge appropriate. Mood and affect appropriate.  Cranial Nerves: Fundoscopic exam reveals sharp disc margins. Pupils equal, briskly reactive to light. Extraocular movements full without nystagmus. Visual fields full to confrontation. Hearing intact. Facial sensation intact. Face, tongue, palate moves normally and symmetrically.  Motor: Normal bulk and tone. Normal strength in all tested extremity muscles. Sensory.: intact to touch ,pinprick .position and vibratory sensation.  Coordination: Rapid alternating movements normal in all extremities. Finger-to-nose and heel-to-shin performed accurately bilaterally. Gait and Station: Arises from chair without difficulty. Stance is normal. Gait demonstrates normal stride length and balance . Able to heel, toe and tandem walk without difficulty.  Reflexes: 1+ and symmetric. Toes downgoing.   NIHSS 0 Modified  [place]  0   ASSESSMENT: 65 year old Caucasian lady with right occipital embolic infarct from right distal PCA occlusion in December 2021 of cryptogenic etiology.  Vascular risk factors of hypertension hyperlipidemia only.     PLAN: I had a long d/w patient about her recent cryptogenic stroke, risk for recurrent stroke/TIAs, personally independently reviewed imaging studies and stroke evaluation results and answered questions.Continue aspirin 81 mg daily  for secondary stroke prevention and maintain strict control of hypertension with blood pressure goal below 130/90, diabetes with hemoglobin A1c goal below 6.5% and lipids with LDL cholesterol goal below 70 mg/dL. I also advised the patient to eat a healthy diet with plenty of whole grains, cereals, fruits and vegetables, exercise regularly and maintain ideal body weight I strongly encouraged her to consider starting a statin to obtain optimal cholesterol control but she wants to wait and have repeat lipid profile checked in a few weeks as she is on a low-fat diet.  She may also consider possible participation in the [place] trial for stroke prevention if interested and will be given  information to review and decide..Followup in the future with my nurse practitioner Shanda Bumps in 3 months or call earlier if necessary. Greater than 50% of time during this 25 minute visit was spent on counseling,explanation of diagnosis, planning of further management, discussion with patient and family and coordination of care Delia Heady, MD Note: This document was prepared with digital dictation and possible smart phrase technology. Any transcriptional errors that result from this process are unintentional

## 2020-05-29 ENCOUNTER — Telehealth: Payer: Self-pay | Admitting: Neurology

## 2020-05-30 NOTE — Telephone Encounter (Signed)
Patient called after hours and left a message requesting a call back from a nurse.

## 2020-05-30 NOTE — Telephone Encounter (Signed)
She should go to the ED.  Always go to the ED ASAP if you think you may be having a stroke.

## 2020-05-30 NOTE — Telephone Encounter (Signed)
LMOVM

## 2020-06-05 NOTE — Progress Notes (Signed)
Carelink Summary Report / Loop Recorder 

## 2020-06-11 ENCOUNTER — Other Ambulatory Visit: Payer: Self-pay | Admitting: Internal Medicine

## 2020-06-11 MED ORDER — ASPIRIN 81 MG PO CHEW: CHEWABLE_TABLET | ORAL | 3 refills | Status: DC

## 2020-06-11 NOTE — Telephone Encounter (Signed)
So we can  Order asa 81 mg per day  As rx  But there is some question if enteric-coated has less effectiveness than regular aspirin in preventing clotting. Also enteric-coated is not known to decrease the risk of stomach side effects.  You may want to ask the neurologist if they have a preference for regular versus enteric-coated aspirin. I will send in reg asa  81 mg per day  For now

## 2020-06-13 NOTE — Telephone Encounter (Signed)
Called patient and discussed Dr. Marlis Edelson recommendation.  Patient stated she thought the EC aspirin was available by prescription only.  She is going to purchase the EC Aspirin OTC.  Patient expressed appreciation for the call.

## 2020-06-16 ENCOUNTER — Other Ambulatory Visit: Payer: Self-pay | Admitting: Internal Medicine

## 2020-06-18 NOTE — Addendum Note (Signed)
Addended by: Lerry Liner on: 06/18/2020 09:19 AM   Modules accepted: Orders

## 2020-06-25 ENCOUNTER — Ambulatory Visit (INDEPENDENT_AMBULATORY_CARE_PROVIDER_SITE_OTHER): Payer: PRIVATE HEALTH INSURANCE

## 2020-06-25 ENCOUNTER — Other Ambulatory Visit: Payer: PRIVATE HEALTH INSURANCE

## 2020-06-25 DIAGNOSIS — I639 Cerebral infarction, unspecified: Secondary | ICD-10-CM | POA: Diagnosis not present

## 2020-06-26 LAB — CUP PACEART REMOTE DEVICE CHECK
Date Time Interrogation Session: 20220214145914
Implantable Pulse Generator Implant Date: 20211209

## 2020-06-29 NOTE — Progress Notes (Signed)
Carelink Summary Report / Loop Recorder 

## 2020-07-02 ENCOUNTER — Ambulatory Visit: Payer: PRIVATE HEALTH INSURANCE | Admitting: Internal Medicine

## 2020-07-07 ENCOUNTER — Other Ambulatory Visit: Payer: Self-pay | Admitting: Internal Medicine

## 2020-07-09 ENCOUNTER — Other Ambulatory Visit: Payer: Self-pay

## 2020-07-09 ENCOUNTER — Telehealth: Payer: Self-pay | Admitting: Internal Medicine

## 2020-07-09 MED ORDER — LISINOPRIL 10 MG PO TABS: 10.0000 mg | ORAL_TABLET | Freq: Two times a day (BID) | ORAL | 0 refills | Status: DC

## 2020-07-09 NOTE — Telephone Encounter (Signed)
90 day supply sent to [facility] on [address]

## 2020-07-09 NOTE — Telephone Encounter (Signed)
Patient wants to know if Dr. Fabian Sharp can send her medication in for 90 days instead of 30 days.  lisinopril (ZESTRIL) 10 MG tablet  [id number] [place], [place] - [address] Phone:  [phone]  Fax:  [phone]

## 2020-07-23 ENCOUNTER — Other Ambulatory Visit: Payer: Self-pay

## 2020-07-23 ENCOUNTER — Other Ambulatory Visit (INDEPENDENT_AMBULATORY_CARE_PROVIDER_SITE_OTHER): Payer: PRIVATE HEALTH INSURANCE

## 2020-07-23 DIAGNOSIS — I10 Essential (primary) hypertension: Secondary | ICD-10-CM | POA: Diagnosis not present

## 2020-07-23 DIAGNOSIS — Z8673 Personal history of transient ischemic attack (TIA), and cerebral infarction without residual deficits: Secondary | ICD-10-CM

## 2020-07-23 DIAGNOSIS — Z79899 Other long term (current) drug therapy: Secondary | ICD-10-CM

## 2020-07-23 LAB — BASIC METABOLIC PANEL
BUN: 10 mg/dL (ref 6–23)
CO2: 31 mEq/L (ref 19–32)
Calcium: 9.1 mg/dL (ref 8.4–10.5)
Chloride: 101 mEq/L (ref 96–112)
Creatinine, Ser: 0.49 mg/dL (ref 0.40–1.20)
GFR: 99.93 mL/min (ref >60.00)
Glucose, Bld: 107 mg/dL — ABNORMAL HIGH (ref 70–99)
Potassium: 4.5 mEq/L (ref 3.5–5.1)
Sodium: 138 mEq/L (ref 135–145)

## 2020-07-23 LAB — LIPID PANEL
Cholesterol: 236 mg/dL — ABNORMAL HIGH (ref 0–200)
HDL: 87.3 mg/dL (ref >39.00)
LDL Cholesterol: 132 mg/dL — ABNORMAL HIGH (ref 0–99)
NonHDL: 148.67
Total CHOL/HDL Ratio: 3
Triglycerides: 81 mg/dL (ref 0.0–149.0)
VLDL: 16.2 mg/dL (ref 0.0–40.0)

## 2020-07-26 ENCOUNTER — Ambulatory Visit (INDEPENDENT_AMBULATORY_CARE_PROVIDER_SITE_OTHER): Payer: PRIVATE HEALTH INSURANCE

## 2020-07-26 DIAGNOSIS — I6300 Cerebral infarction due to thrombosis of unspecified precerebral artery: Secondary | ICD-10-CM | POA: Diagnosis not present

## 2020-07-27 NOTE — Progress Notes (Signed)
Will discuss at upcoming visit.

## 2020-07-30 ENCOUNTER — Encounter: Payer: Self-pay | Admitting: Internal Medicine

## 2020-07-30 ENCOUNTER — Ambulatory Visit (INDEPENDENT_AMBULATORY_CARE_PROVIDER_SITE_OTHER): Payer: PRIVATE HEALTH INSURANCE | Admitting: Internal Medicine

## 2020-07-30 ENCOUNTER — Other Ambulatory Visit: Payer: Self-pay

## 2020-07-30 VITALS — BP 138/76 | HR 63 | Temp 98.1°F | Ht 63.0 in | Wt 124.6 lb

## 2020-07-30 DIAGNOSIS — Z8673 Personal history of transient ischemic attack (TIA), and cerebral infarction without residual deficits: Secondary | ICD-10-CM | POA: Diagnosis not present

## 2020-07-30 DIAGNOSIS — Z79899 Other long term (current) drug therapy: Secondary | ICD-10-CM | POA: Diagnosis not present

## 2020-07-30 DIAGNOSIS — E782 Mixed hyperlipidemia: Secondary | ICD-10-CM

## 2020-07-30 DIAGNOSIS — I10 Essential (primary) hypertension: Secondary | ICD-10-CM

## 2020-07-30 DIAGNOSIS — Z801 Family history of malignant neoplasm of trachea, bronchus and lung: Secondary | ICD-10-CM

## 2020-07-30 LAB — CUP PACEART REMOTE DEVICE CHECK
Date Time Interrogation Session: 20220319145757
Implantable Pulse Generator Implant Date: 20211209

## 2020-07-30 NOTE — Patient Instructions (Addendum)
Glad  you are doing well.  bp control  Consider statin med as discussed . Consider genetic counseling about the family history . Of  Small cell cancer  In family.   Continue lifestyle intervention healthy eating and exercise . And bp control  mammogram cologuard for screening cancers   As we discussed.   Yearly check and lab  cpx or as needed.    Lung Cancer Screening A lung cancer screening is a test that checks for lung cancer. Lung cancer screening is done to look for lung cancer in its very early stages when you are not likely to have any symptoms and before it spreads beyond the lung, making it harder to treat. Finding cancer early improves the chances of successful treatment. It may save your life. Who should have screening? You should be screened for lung cancer if all of these apply:  You currently smoke or you have quit smoking within the past 15 years.  You are 43-84 years old. Screening may be recommended up to age 54 depending on your overall health and other factors.  You are in good general health.  You have a smoking history of 1 pack of cigarettes a day for 20 years or 2 packs a day for 10 years. Screening may also be recommended if you are at high risk for the disease. You may be at high risk if:  You have a family history of lung cancer.  You have been exposed to asbestos or radon.  You have chronic obstructive pulmonary disease (COPD). How is screening done? The recommended screening test is a low-dose computed tomography (LDCT) scan. This scan takes detailed images of the lungs. This allows a health care provider to look for abnormal cells. If you are at risk for lung cancer, it is recommended that you get screened once a year. Talk to your health care provider about the risks, benefits, and limitations of screening.   What are the benefits of screening? Screening can find lung cancer early, before symptoms start and before it has spread outside of the lungs. The  chances of curing lung cancer are greater if the cancer is diagnosed early. What are the risks of screening?  The screening may show lung cancer when no cancer is present (false-positive result).  The screening may not find lung cancer when it is present.  The person gets exposed to radiation. How can I lower my risk of lung cancer? Make these lifestyle changes to lower your risk of developing lung cancer:  Do not use any products that contain nicotine or tobacco, such as cigarettes, e-cigarettes, and chewing tobacco. If you need help quitting, ask your health care provider.  Avoid secondhand smoke.  Avoid exposure to radiation.  Avoid exposure to radon gas. Have your home checked for radon regularly.  Avoid things that cause cancer (carcinogens).  Avoid living or working in places with high air pollution. Questions to ask your health care provider  Am I eligible for lung cancer screening?  Does my health insurance cover the cost of lung cancer screening?  What happens if the lung cancer screening shows something of concern?  How soon will I have results from my lung cancer screening?  Is there anything that I need to do to prepare for my lung cancer screening?  What happens if I decide not to have lung cancer screening? Where to find more information Ask your health care provider about the risks and benefits of screening. More information and resources  are available from these organizations:  American Cancer Society (ACS): www.cancer.org  American Lung Association: www.lung.org Contact a health care provider if:  You start to show symptoms of lung cancer, including: ? Coughing that will not go away. ? Making whistling sounds when you breathe (wheezing). ? Chest pain. ? Coughing up blood. ? Shortness of breath. ? Weight loss that cannot be explained. ? Constant tiredness (fatigue). ? Hoarse voice. Summary  Lung cancer screening may find lung cancer before  symptoms appear. Finding cancer early improves the chances of successful treatment. It may save your life.  The recommended screening test is a low-dose computed tomography (LDCT) scan that looks for abnormal cells in the lungs. If you are at risk for lung cancer, it is recommended that you get screened once a year.  You can make lifestyle changes to lower your risk of lung cancer.  Ask your health care provider about the risks and benefits of screening. This information is not intended to replace advice given to you by your health care provider. Make sure you discuss any questions you have with your health care provider. Document Revised: 09/19/2019 Document Reviewed: 04/26/2019 Elsevier Patient Education  2021 [employer].

## 2020-07-30 NOTE — Progress Notes (Signed)
Chief Complaint  Patient presents with  . Follow-up    Blood pressure follow up  . Labs    HPI: Vicki Stevens 65 y.o. come in for follow-up lab work blood pressure control and med check. She has a hx of cryptogenic stroke  And  Has cv goals defined note dr Ronnald Ramp   Lipid ldl to be 70 or below and blood pressure at goal.  Taking lisinopril 10 twice daily without side effect and is pleased about control she has a log of her blood pressure readings today  competeing age group in running.  Exercises quite regularly and has cut down on salt and noticed her shoes are too big for her feet so l may be less edema.  No cardiovascular symptoms such as chest pain shortness of breath exercise intolerance Family history is markedly positive for small cell cancer of the lung including her father who died in his 30s from that disease. Has not had a mammogram recently negative family history colon cancer screening no colon cancer in the family.   ROS: See pertinent positives and negatives per HPI.  Past Medical History:  Diagnosis Date  . H/O hematuria remote   dr Vonita Moss felt to be from running   . Heart murmur    evaluation ? mvp when younger  . Hx of varicella   . Hypertension   . LOW BACK PAIN, ACUTE 03/05/2010  . Stroke [facility]) 04/17/2020    Family History  Problem Relation Age of Onset  . Other Mother        MVA 36   . Lung cancer Father        43    Social History   Socioeconomic History  . Marital status: Married    Spouse name: Reino Kent  . Number of children: Not on file  . Years of education: Not on file  . Highest education level: Not on file  Occupational History  . Occupation: part time/self employed  Tobacco Use  . Smoking status: Never Smoker  . Smokeless tobacco: Never Used  Vaping Use  . Vaping Use: Never used  Substance and Sexual Activity  . Alcohol use: Yes    Alcohol/week: 0.0 standard drinks  . Drug use: Never  . Sexual activity: Not on file   Other Topics Concern  . Not on file  Social History Narrative   Lives with Husband   caffeine 2-3 in am ocass    Right handed   Social Determinants of Health   Financial Resource Strain: Not on file  Food Insecurity: Not on file  Transportation Needs: Not on file  Physical Activity: Not on file  Stress: Not on file  Social Connections: Not on file    Outpatient Medications Prior to Visit  Medication Sig Dispense Refill  . aspirin 81 MG chewable tablet 1 po qd  Daily 90 tablet 3  . lisinopril (ZESTRIL) 10 MG tablet Take 1 tablet (10 mg total) by mouth in the morning and at bedtime. 180 tablet 0  . Multiple Vitamins-Minerals (ADULT GUMMY PO) Take by mouth.     No facility-administered medications prior to visit.     EXAM:  BP 138/76 (BP Location: Right Arm, Patient Position: Sitting, Cuff Size: Normal)   Pulse 63   Temp 98.1 F (36.7 C) (Oral)   Ht 5\' 3"  (1.6 m)   Wt 124 lb 9.6 oz (56.5 kg)   SpO2 97%   BMI 22.07 kg/m   Body mass index is  22.07 kg/m. Reviewed blood pressure readings most midday and after her 120 or below morning ones are higher 1 30-1 40 pulses in the 50s diastolic in the 70s 80s GENERAL: vitals reviewed and listed above, alert, oriented, appears well hydrated and in no acute distress HEENT: atraumatic, conjunctiva  clear, no obvious abnormalities on inspection of external nose and ears OP : no lesion edema or exudate  NECK: no obvious masses on inspection palpation  LUNGS: clear to auscultation bilaterally, no wheezes, rales or rhonchi, good air movement CV: HRRR, no clubbing cyanosis or  peripheral edema nl cap refill  MS: moves all extremities without noticeable focal  abnormality PSYCH: pleasant and cooperative, no obvious depression or anxiety Lab Results  Component Value Date   WBC 5.4 04/19/2020   HGB 14.6 04/19/2020   HCT 43.4 04/19/2020   PLT 261 04/19/2020   GLUCOSE 107 (H) 07/23/2020   CHOL 236 (H) 07/23/2020   TRIG 81.0 07/23/2020    HDL 87.30 07/23/2020   LDLCALC 132 (H) 07/23/2020   ALT 17 04/17/2020   AST 19 04/17/2020   NA 138 07/23/2020   K 4.5 07/23/2020   CL 101 07/23/2020   CREATININE 0.49 07/23/2020   BUN 10 07/23/2020   CO2 31 07/23/2020   TSH 1.01 06/12/2014   INR 1.0 04/17/2020   HGBA1C 5.3 04/18/2020   BP Readings from Last 3 Encounters:  07/30/20 138/76  05/24/20 139/88  05/14/20 (!) 170/102    ASSESSMENT AND PLAN:  Discussed the following assessment and plan:  Essential hypertension  Medication management  History of CVA (cerebrovascular accident) cryptogenic stroke   Mixed hyperlipidemia - decline statin at this time   Family history of neoplasm of lung - small cell Discussed in number preventive parameters advised will wait on the tetanus booster. Discussed statin medicines and reduction of cardiovascular risk in her situation even though she has  healthy lifestyle  She declines statin medicine at this time. . With a strong family history of cancer she can consider genetic counseling consideration for lung cancer screening program even though she is not a smoker.  She will let us know Continue same medication would need lab and visit yearly to continue but she may want an ache a CPX in the winter as due. She would let us know if her blood pressure is not or no longer controlled at goal. We can discuss again with the neurology team about the benefit of lowering her LDL with medication.  Review cancel plan 30 minutes. -Patient advised to return or notify health care team  if  new concerns arise.  Patient Instructions   Glad  you are doing well.  bp control  Consider statin med as discussed . Consider genetic counseling about the family history . Of  Small cell cancer  In family.   Continue lifestyle intervention healthy eating and exercise . And bp control  mammogram cologuard for screening cancers   As we discussed.   Yearly check and lab  cpx or as needed.    Lung  Cancer Screening A lung cancer screening is a test that checks for lung cancer. Lung cancer screening is done to look for lung cancer in its very early stages when you are not likely to have any symptoms and before it spreads beyond the lung, making it harder to treat. Finding cancer early improves the chances of successful treatment. It may save your life. Who should have screening? You should be screened for lung cancer if all  of these apply:  You currently smoke or you have quit smoking within the past 15 years.  You are 29-20 years old. Screening may be recommended up to age 64 depending on your overall health and other factors.  You are in good general health.  You have a smoking history of 1 pack of cigarettes a day for 20 years or 2 packs a day for 10 years. Screening may also be recommended if you are at high risk for the disease. You may be at high risk if:  You have a family history of lung cancer.  You have been exposed to asbestos or radon.  You have chronic obstructive pulmonary disease (COPD). How is screening done? The recommended screening test is a low-dose computed tomography (LDCT) scan. This scan takes detailed images of the lungs. This allows a health care provider to look for abnormal cells. If you are at risk for lung cancer, it is recommended that you get screened once a year. Talk to your health care provider about the risks, benefits, and limitations of screening.   What are the benefits of screening? Screening can find lung cancer early, before symptoms start and before it has spread outside of the lungs. The chances of curing lung cancer are greater if the cancer is diagnosed early. What are the risks of screening?  The screening may show lung cancer when no cancer is present (false-positive result).  The screening may not find lung cancer when it is present.  The person gets exposed to radiation. How can I lower my risk of lung cancer? Make these lifestyle  changes to lower your risk of developing lung cancer:  Do not use any products that contain nicotine or tobacco, such as cigarettes, e-cigarettes, and chewing tobacco. If you need help quitting, ask your health care provider.  Avoid secondhand smoke.  Avoid exposure to radiation.  Avoid exposure to radon gas. Have your home checked for radon regularly.  Avoid things that cause cancer (carcinogens).  Avoid living or working in places with high air pollution. Questions to ask your health care provider  Am I eligible for lung cancer screening?  Does my health insurance cover the cost of lung cancer screening?  What happens if the lung cancer screening shows something of concern?  How soon will I have results from my lung cancer screening?  Is there anything that I need to do to prepare for my lung cancer screening?  What happens if I decide not to have lung cancer screening? Where to find more information Ask your health care provider about the risks and benefits of screening. More information and resources are available from these organizations:  American Cancer Society (ACS): www.cancer.org  American Lung Association: www.lung.org Contact a health care provider if:  You start to show symptoms of lung cancer, including: ? Coughing that will not go away. ? Making whistling sounds when you breathe (wheezing). ? Chest pain. ? Coughing up blood. ? Shortness of breath. ? Weight loss that cannot be explained. ? Constant tiredness (fatigue). ? Hoarse voice. Summary  Lung cancer screening may find lung cancer before symptoms appear. Finding cancer early improves the chances of successful treatment. It may save your life.  The recommended screening test is a low-dose computed tomography (LDCT) scan that looks for abnormal cells in the lungs. If you are at risk for lung cancer, it is recommended that you get screened once a year.  You can make lifestyle changes to lower your risk  of  lung cancer.  Ask your health care provider about the risks and benefits of screening. This information is not intended to replace advice given to you by your health care provider. Make sure you discuss any questions you have with your health care provider. Document Revised: 09/19/2019 Document Reviewed: 04/26/2019 Elsevier Patient Education  2021 [employer].     [place]. Carianna Lague M.D.

## 2020-08-01 ENCOUNTER — Telehealth: Payer: Self-pay

## 2020-08-01 NOTE — Telephone Encounter (Signed)
Patient called in wanting to know if a nurse can go over her results from her transmission as she does not understand

## 2020-08-01 NOTE — Telephone Encounter (Signed)
Spoke with pt, reviewed results from recent linq transmission.  She was concerned about previous notation indicating, AF, explained that the strips were reviewed and determined to be false AF due to oversensing.

## 2020-08-03 NOTE — Progress Notes (Signed)
Carelink Summary Report / Loop Recorder 

## 2020-08-27 ENCOUNTER — Ambulatory Visit: Payer: PRIVATE HEALTH INSURANCE | Admitting: Adult Health

## 2020-08-27 ENCOUNTER — Ambulatory Visit (INDEPENDENT_AMBULATORY_CARE_PROVIDER_SITE_OTHER): Payer: PRIVATE HEALTH INSURANCE

## 2020-08-27 DIAGNOSIS — I6300 Cerebral infarction due to thrombosis of unspecified precerebral artery: Secondary | ICD-10-CM | POA: Diagnosis not present

## 2020-08-31 ENCOUNTER — Telehealth: Payer: Self-pay

## 2020-08-31 LAB — CUP PACEART REMOTE DEVICE CHECK
Date Time Interrogation Session: 20220421150050
Implantable Pulse Generator Implant Date: 20211209

## 2020-08-31 NOTE — Telephone Encounter (Signed)
Incoming telephone call from patient wishing to discuss ILR report received via MyChart 08/31/20. Patient concerned about AF episode that is reported as possible artifact 3/24. Patient runs daily however has not ran over the last 3 weeks secondary to injury. Patient resumed running two days ago. SR noted on todays EGM. Asprin compliant. Patient appreciative of call and explanation of artifact. Will continue to monitor remotes closely.

## 2020-09-12 NOTE — Progress Notes (Signed)
Carelink Summary Report / Loop Recorder 

## 2020-09-27 ENCOUNTER — Ambulatory Visit (INDEPENDENT_AMBULATORY_CARE_PROVIDER_SITE_OTHER): Payer: PRIVATE HEALTH INSURANCE

## 2020-09-27 DIAGNOSIS — I6300 Cerebral infarction due to thrombosis of unspecified precerebral artery: Secondary | ICD-10-CM

## 2020-10-03 LAB — CUP PACEART REMOTE DEVICE CHECK
Date Time Interrogation Session: 20220524145759
Implantable Pulse Generator Implant Date: 20211209

## 2020-10-05 ENCOUNTER — Other Ambulatory Visit: Payer: Self-pay | Admitting: Internal Medicine

## 2020-10-19 NOTE — Progress Notes (Signed)
Carelink Summary Report / Loop Recorder 

## 2020-10-28 LAB — CUP PACEART REMOTE DEVICE CHECK
Date Time Interrogation Session: 20220618020255
Implantable Pulse Generator Implant Date: 20211209

## 2020-11-05 ENCOUNTER — Ambulatory Visit (INDEPENDENT_AMBULATORY_CARE_PROVIDER_SITE_OTHER): Payer: PRIVATE HEALTH INSURANCE

## 2020-11-05 DIAGNOSIS — I6300 Cerebral infarction due to thrombosis of unspecified precerebral artery: Secondary | ICD-10-CM | POA: Diagnosis not present

## 2020-11-06 LAB — CUP PACEART REMOTE DEVICE CHECK
Date Time Interrogation Session: 20220626145822
Implantable Pulse Generator Implant Date: 20211209

## 2020-11-09 ENCOUNTER — Encounter (HOSPITAL_BASED_OUTPATIENT_CLINIC_OR_DEPARTMENT_OTHER): Payer: Self-pay | Admitting: *Deleted

## 2020-11-09 ENCOUNTER — Emergency Department (INDEPENDENT_AMBULATORY_CARE_PROVIDER_SITE_OTHER)
Admission: EM | Admit: 2020-11-09 | Discharge: 2020-11-09 | Disposition: A | Discharge status: Home / Self Care | Payer: PRIVATE HEALTH INSURANCE | Source: Home / Self Care | Attending: Family Medicine | Admitting: Family Medicine

## 2020-11-09 ENCOUNTER — Emergency Department (INDEPENDENT_AMBULATORY_CARE_PROVIDER_SITE_OTHER): Payer: PRIVATE HEALTH INSURANCE

## 2020-11-09 ENCOUNTER — Ambulatory Visit (HOSPITAL_BASED_OUTPATIENT_CLINIC_OR_DEPARTMENT_OTHER): Payer: PRIVATE HEALTH INSURANCE

## 2020-11-09 ENCOUNTER — Ambulatory Visit (HOSPITAL_COMMUNITY)
Admission: EM | Admit: 2020-11-09 | Discharge: 2020-11-09 | Disposition: A | Discharge status: Home / Self Care | Payer: PRIVATE HEALTH INSURANCE

## 2020-11-09 ENCOUNTER — Other Ambulatory Visit: Payer: Self-pay

## 2020-11-09 ENCOUNTER — Emergency Department (HOSPITAL_BASED_OUTPATIENT_CLINIC_OR_DEPARTMENT_OTHER)
Admission: EM | Admit: 2020-11-09 | Discharge: 2020-11-09 | Disposition: A | Discharge status: Home / Self Care | Payer: 59 | Attending: Emergency Medicine | Admitting: Emergency Medicine

## 2020-11-09 ENCOUNTER — Other Ambulatory Visit (HOSPITAL_BASED_OUTPATIENT_CLINIC_OR_DEPARTMENT_OTHER): Payer: Self-pay

## 2020-11-09 ENCOUNTER — Encounter: Payer: Self-pay | Admitting: Emergency Medicine

## 2020-11-09 DIAGNOSIS — S80211A Abrasion, right knee, initial encounter: Secondary | ICD-10-CM | POA: Diagnosis not present

## 2020-11-09 DIAGNOSIS — Z5321 Procedure and treatment not carried out due to patient leaving prior to being seen by health care provider: Secondary | ICD-10-CM | POA: Insufficient documentation

## 2020-11-09 DIAGNOSIS — S0081XA Abrasion of other part of head, initial encounter: Secondary | ICD-10-CM | POA: Diagnosis not present

## 2020-11-09 DIAGNOSIS — S42295A Other nondisplaced fracture of upper end of left humerus, initial encounter for closed fracture: Secondary | ICD-10-CM

## 2020-11-09 DIAGNOSIS — Y9302 Activity, running: Secondary | ICD-10-CM | POA: Insufficient documentation

## 2020-11-09 DIAGNOSIS — W19XXXA Unspecified fall, initial encounter: Secondary | ICD-10-CM | POA: Insufficient documentation

## 2020-11-09 DIAGNOSIS — M79602 Pain in left arm: Secondary | ICD-10-CM

## 2020-11-09 DIAGNOSIS — M25512 Pain in left shoulder: Secondary | ICD-10-CM | POA: Diagnosis present

## 2020-11-09 IMAGING — DX DG HUMERUS 2V *L*
2 series · 2 of 2 positions shown · non-contrast
Comparison: None.

CLINICAL DATA: Recent fall with left arm pain, initial encounter

EXAM:
LEFT HUMERUS - 2+ VIEW

[humerus ap]
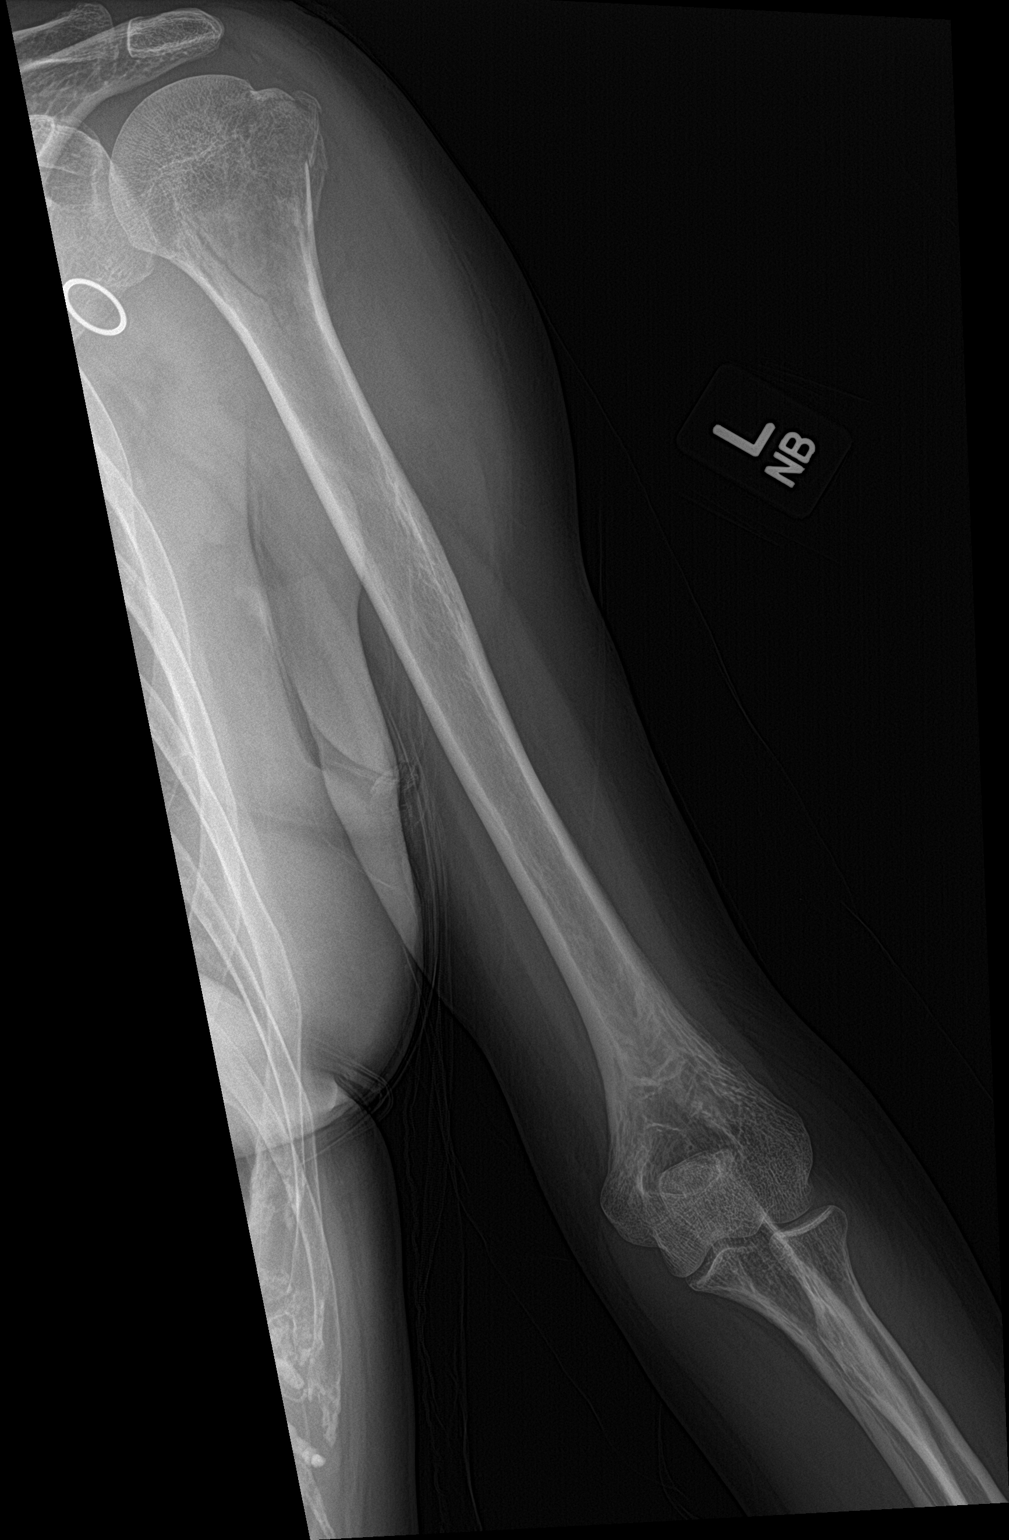

[humerus lat]
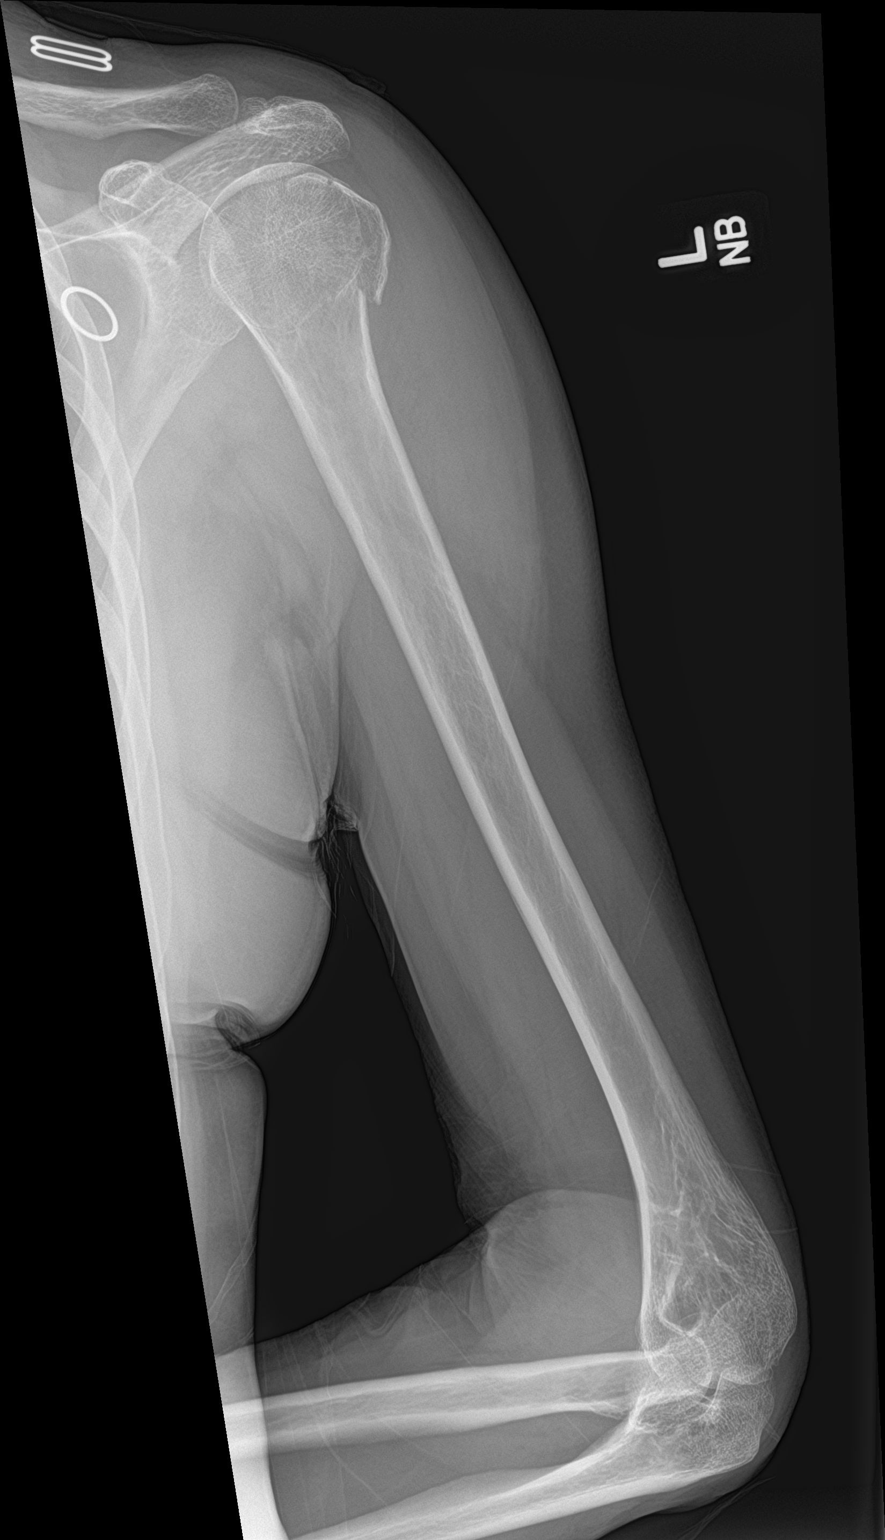

[2 of 2 positions shown; findings below may reference images not displayed]

FINDINGS: Comminuted fracture is noted in the proximal left humerus without
evidence of dislocation. The distal humerus is within normal limits.
IMPRESSION: Comminuted proximal left humeral fracture

## 2020-11-09 IMAGING — DX DG SHOULDER 2+V*L*
2 series · 2 of 2 positions shown · non-contrast
Comparison: None.

CLINICAL DATA: Recent trip and fall with shoulder pain, initial
encounter

EXAM:
LEFT SHOULDER - 2+ VIEW

[shoulder grashey]
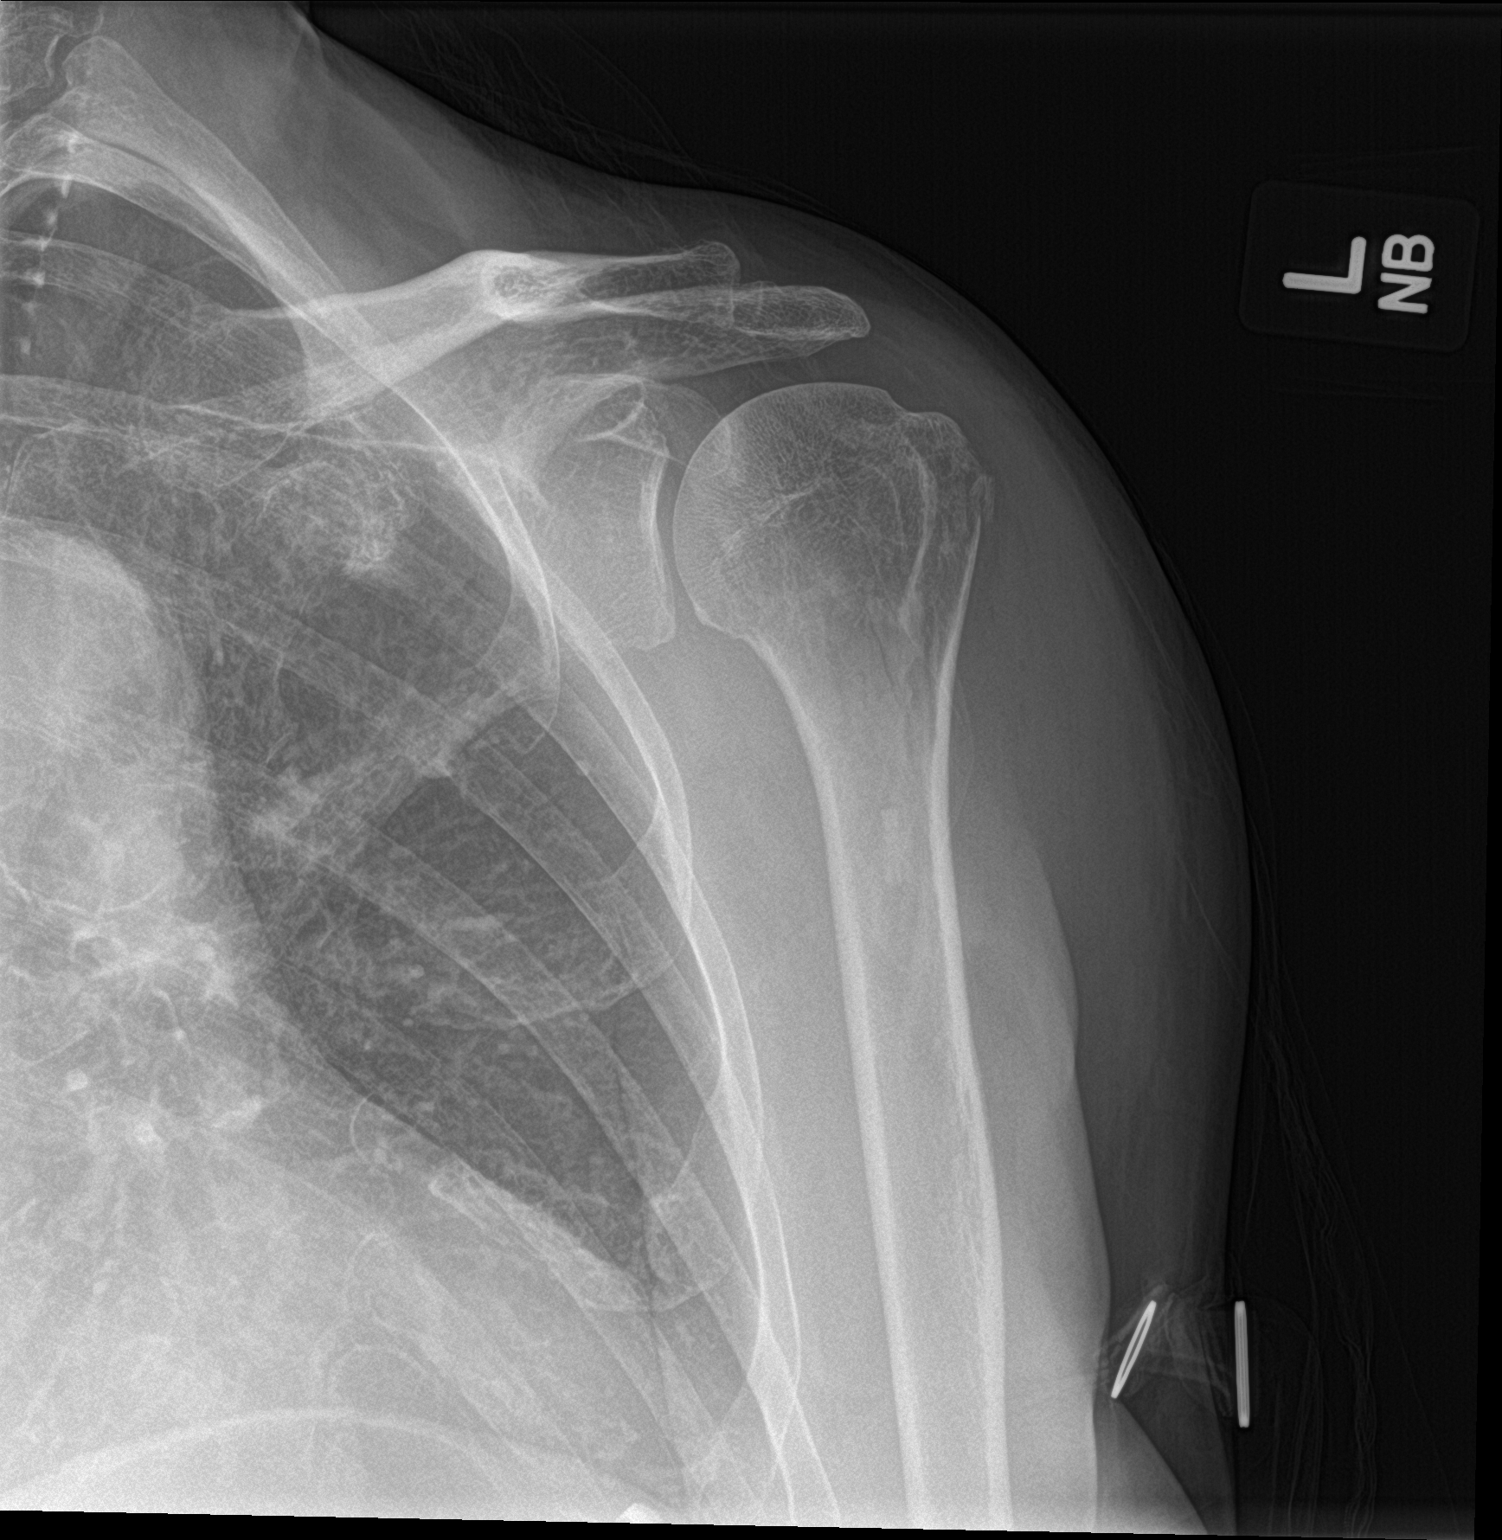

[shoulder y view]
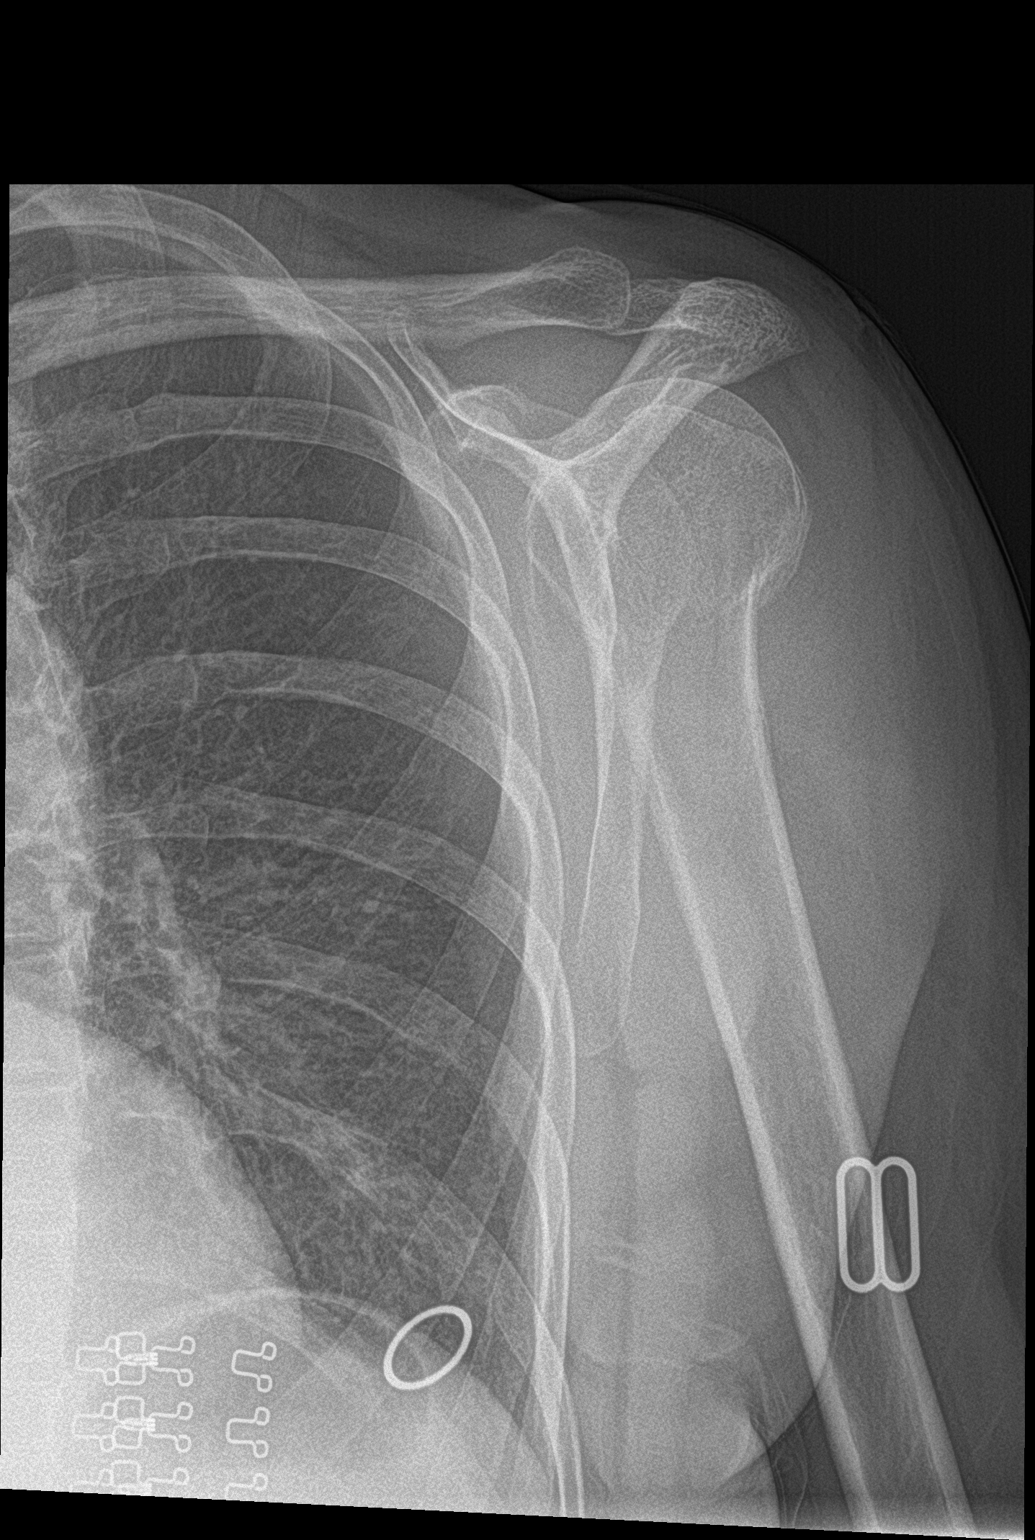

[2 of 2 positions shown; findings below may reference images not displayed]

FINDINGS: Comminuted fracture of the proximal left humerus is noted. No
dislocation is seen. Mild impaction at the fracture site primarily
in the surgical neck is noted. Underlying bony thorax appears within
normal limits.
IMPRESSION: Comminuted proximal left humeral fracture without dislocation.

## 2020-11-09 MED ORDER — ONDANSETRON 4 MG PO TBDP
4.0000 mg | ORAL_TABLET | Freq: Once | ORAL | Status: AC
  Administered 2020-11-09: 4 mg via ORAL

## 2020-11-09 MED ORDER — ACETAMINOPHEN 325 MG PO TABS
650.0000 mg | ORAL_TABLET | Freq: Once | ORAL | Status: AC
  Administered 2020-11-09: 650 mg via ORAL

## 2020-11-09 MED ORDER — HYDROCODONE-ACETAMINOPHEN 5-325 MG PO TABS: 1.0000 | ORAL_TABLET | Freq: Four times a day (QID) | ORAL | 0 refills | Status: DC | PRN

## 2020-11-09 NOTE — ED Triage Notes (Signed)
Pt tripped while running this morning at 0845 Abrasions noted to bilateral knees Denies LOC  Mild nausea Pt also has small laceration above her left eye  Bruising to left palm of hand  Limited ROM to left humerus w/ pain - pt unable to lift it up  COVID 2021 before J &  J vaccine  (4/21)

## 2020-11-09 NOTE — Discharge Instructions (Addendum)
Apply ice pack for 20 to 30 minutes, 3 to 4 times daily  Continue until pain and swelling decrease.  Wear "cuff and collar" left arm until evaluated by Dr. Rodney Langton.  Change dressing daily and apply Bacitracin ointment to wound on knee.  Keep wounds clean and dry.  Return for any signs of infection (or follow-up with family doctor):  Increasing redness, swelling, pain, heat, drainage, etc.   Follow instructions on [facility] information sheet.

## 2020-11-09 NOTE — ED Triage Notes (Signed)
Stumbled on something while she was running this morning.  Pt did not pass out. Complaint of pain to left shoulder.

## 2020-11-09 NOTE — ED Provider Notes (Addendum)
[facility]    CSN: [id number] Arrival date & time: 11/09/20  1348      History   Chief Complaint Chief Complaint  Patient presents with   Fall    HPI Vicki ManifoldSandra L Neubecker is a 65 y.o. female.   Patient tripped while running this morning, landing on her left upper arm and shoulder.  She also sustained an abrasion lateral to her left eye and abrasions on anterior knees.  She denies loss of consciousness, headache, and neurologic symptoms.  She has had persistent pain and limited range of motion of her left shoulder.  She states that she washed all wounds with soap and water. Her last Td appears to have been 05/12/06.  The history is provided by the patient.   Past Medical History:  Diagnosis Date   COVID-19 2021   H/O hematuria remote   dr Vonita MossPeterson felt to be from running    Heart murmur    evaluation ? mvp when younger   Hx of varicella    Hypertension    LOW BACK PAIN, ACUTE 03/05/2010   Stroke (HCC) 04/17/2020    Patient Active Problem List   Diagnosis Date Noted   Mixed hyperlipidemia 05/14/2020   CVA (cerebral vascular accident) (HCC) 04/17/2020   Chest tightness or pressure 06/12/2014   Influenza vaccination declined 06/12/2014   Insomnia 07/22/2013   Visit for preventive health examination 07/22/2013   Perianal dermatitis 08/04/2012   Weight gain 08/04/2012   Counseling on health promotion and disease prevention 08/04/2012   Essential hypertension 08/04/2012    Past Surgical History:  Procedure Laterality Date   BUBBLE STUDY  04/19/2020   Procedure: BUBBLE STUDY;  Surgeon: Parke PoissonAcharya, Gayatri A, MD;  [facility];  Service: Cardiology;;   LOOP RECORDER INSERTION N/A 04/19/2020   Procedure: LOOP RECORDER INSERTION;  Surgeon: Lanier PrudeLambert, Cameron T, MD;  Location: [facility] INVASIVE CV LAB;  Service: Cardiovascular;  Laterality: N/A;   TEE WITHOUT CARDIOVERSION N/A 04/19/2020   Procedure: TRANSESOPHAGEAL ECHOCARDIOGRAM (TEE);  Surgeon: Parke PoissonAcharya, Gayatri A, MD;   [facility];  Service: Cardiology;  Laterality: N/A;    OB History     Gravida  3   Para  1   Term      Preterm      AB  2   Living         SAB      IAB      Ectopic      Multiple      Live Births               Home Medications    Prior to Admission medications   Medication Sig Start Date End Date Taking? Authorizing Provider  aspirin 81 MG chewable tablet 1 po qd  Daily 06/11/20  Yes Panosh, Neta MendsWanda K, MD  HYDROcodone-acetaminophen (NORCO/VICODIN) 5-325 MG tablet Take 1 tablet by mouth every 6 (six) hours as needed for moderate pain or severe pain. 11/09/20  Yes Lattie HawBeese, Demara Lover A, MD  lisinopril (ZESTRIL) 10 MG tablet TAKE 1 TABLET (10 MG TOTAL) BY MOUTH IN THE MORNING AND AT BEDTIME. 10/05/20 11/09/20 Yes Panosh, Neta MendsWanda K, MD    Family History Family History  Problem Relation Age of Onset   Other Mother        MVA 4025    Lung cancer Father        3937    Social History Social History   Tobacco Use   Smoking status: Never  Smokeless tobacco: Never  Vaping Use   Vaping Use: Never used  Substance Use Topics   Alcohol use: Yes    Comment: occasonally   Drug use: Never     Allergies   Patient has no known allergies.   Review of Systems Review of Systems  Constitutional:  Negative for activity change, appetite change, chills, diaphoresis, fatigue and fever.  HENT: Negative.    Eyes: Negative.   Respiratory: Negative.    Cardiovascular: Negative.   Gastrointestinal: Negative.   Genitourinary: Negative.   Musculoskeletal:        Left shoulder pain.   Skin:  Positive for wound.  Neurological:  Negative for dizziness, syncope, facial asymmetry, speech difficulty, light-headedness, numbness and headaches.    Physical Exam Triage Vital Signs ED Triage Vitals  Enc Vitals Group     BP 11/09/20 1441 113/77     Pulse Rate 11/09/20 1441 67     Resp 11/09/20 1441 15     Temp 11/09/20 1441 98.8 F (37.1 C)     Temp Source 11/09/20 1441  Oral     SpO2 11/09/20 1441 100 %     Weight --      Height --      Head Circumference --      Peak Flow --      Pain Score 11/09/20 1445 5     Pain Loc --      Pain Edu? --      Excl. in GC? --    No data found.  Updated Vital Signs BP 113/77 (BP Location: Right Arm)   Pulse 67   Temp 98.8 F (37.1 C) (Oral)   Resp 15   SpO2 100%   Visual Acuity Right Eye Distance:   Left Eye Distance:   Bilateral Distance:    Right Eye Near:   Left Eye Near:    Bilateral Near:     Physical Exam Vitals and nursing note reviewed.  Constitutional:      General: She is not in acute distress.    Appearance: She is not ill-appearing.  HENT:     Head: Abrasion present. No raccoon eyes.     Jaw: There is normal jaw occlusion.      Comments: Lateral to the left eye is a 1.5cm long abrasion/flap laceration in normal anatomic alignment.  There is minimal peri-orbital swelling/tenderness.    Right Ear: External ear normal.     Left Ear: External ear normal.     Nose: Nose normal.     Mouth/Throat:     Pharynx: Oropharynx is clear.  Eyes:     Extraocular Movements: Extraocular movements intact.     Conjunctiva/sclera: Conjunctivae normal.     Pupils: Pupils are equal, round, and reactive to light.  Cardiovascular:     Rate and Rhythm: Normal rate and regular rhythm.     Heart sounds: Normal heart sounds.  Pulmonary:     Breath sounds: Normal breath sounds.  Abdominal:     Palpations: Abdomen is soft.     Tenderness: There is no abdominal tenderness.  Musculoskeletal:     Left shoulder: Tenderness and bony tenderness present. No swelling or deformity. Decreased range of motion. Normal pulse.     Cervical back: Normal range of motion. No tenderness.     Right knee: No swelling. Normal range of motion.     Left knee: No swelling. Normal range of motion.     Right lower leg: No edema.  Left lower leg: No edema.       Legs:     Comments: Left shoulder has tenderness to palpation  laterally and limited range of motion.  Distal neurovascular function is intact.   Right knee has a superficial abrasion about 4cm diameter anteriorly.  Left anterior knee has a minimal abrasion present.  Skin:    General: Skin is warm.  Neurological:     General: No focal deficit present.     Mental Status: She is alert and oriented to person, place, and time.     Cranial Nerves: No cranial nerve deficit.     Sensory: No sensory deficit.     Motor: No weakness.     Gait: Gait normal.     UC Treatments / Results  Labs (all labs ordered are listed, but only abnormal results are displayed) Labs Reviewed - No data to display  EKG   Radiology DG Shoulder Left  Result Date: 11/09/2020 CLINICAL DATA:  Recent trip and fall with shoulder pain, initial encounter EXAM: LEFT SHOULDER - 2+ VIEW COMPARISON:  None. FINDINGS: Comminuted fracture of the proximal left humerus is noted. No dislocation is seen. Mild impaction at the fracture site primarily in the surgical neck is noted. Underlying bony thorax appears within normal limits. IMPRESSION: Comminuted proximal left humeral fracture without dislocation. Electronically Signed   By: Alcide Clever M.D.   On: 11/09/2020 15:35   DG Humerus Left  Result Date: 11/09/2020 CLINICAL DATA:  Recent fall with left arm pain, initial encounter EXAM: LEFT HUMERUS - 2+ VIEW COMPARISON:  None. FINDINGS: Comminuted fracture is noted in the proximal left humerus without evidence of dislocation. The distal humerus is within normal limits. IMPRESSION: Comminuted proximal left humeral fracture Electronically Signed   By: Alcide Clever M.D.   On: 11/09/2020 15:34    Procedures Procedures  Laceration/abrasion Repair (Dermabond) Discussed benefits and risks of procedure and verbal consent obtained. Using sterile technique, cleansed wound at lateral aspect of left eye with normal saline.  Wound carefully inspected for debris and foreign bodies; none found.  Wound edges  carefully approximated in normal anatomic position and closed with Dermabond.  Wound precautions explained to patient.     Medications Ordered in UC Medications  acetaminophen (TYLENOL) tablet 650 mg (650 mg Oral Given 11/09/20 1452)  ondansetron (ZOFRAN-ODT) disintegrating tablet 4 mg (4 mg Oral Given 11/09/20 1450)    Initial Impression / Assessment and Plan / UC Course  I have reviewed the triage vital signs and the nursing notes.  Pertinent labs & imaging results that were available during my care of the patient were reviewed by me and considered in my medical decision making (see chart for details).    Applied cuff and collar.  Followup with Dr. Rodney Langton 11/13/20 at 2:35pm. Rx for Vicodin (#10, no refill) Controlled Substance Prescriptions I have consulted the  Controlled Substances Registry for this patient, and feel the risk/benefit ratio today is favorable for proceeding with this prescription for a controlled substance.   Patient declined Tdap.  Final Clinical Impressions(s) / UC Diagnoses   Final diagnoses:  Other closed nondisplaced fracture of proximal end of left humerus, initial encounter  Abrasion, right knee, initial encounter  Abrasion of periorbital region of face, initial encounter     Discharge Instructions      Apply ice pack for 20 to 30 minutes, 3 to 4 times daily  Continue until pain and swelling decrease.  Wear "cuff and collar" left arm until  evaluated by Dr. Rodney Langton.  Change dressing daily and apply Bacitracin ointment to wound on knee.  Keep wounds clean and dry.  Return for any signs of infection (or follow-up with family doctor):  Increasing redness, swelling, pain, heat, drainage, etc.   Follow instructions on Dermabond information sheet.       ED Prescriptions     Medication Sig Dispense Auth. Provider   HYDROcodone-acetaminophen (NORCO/VICODIN) 5-325 MG tablet Take 1 tablet by mouth every 6 (six) hours as needed for  moderate pain or severe pain. 10 tablet Lattie Haw, MD         Lattie Haw, MD 11/11/20 [phone]  Addendum:  Bacitracin and bandages applied to abrasions.    Lattie Haw, MD 11/11/20 1001

## 2020-11-09 NOTE — ED Provider Notes (Signed)
Pt thought she was coming to an urgent care, not an emergency department.  She left prior to being seen by me because she did not want an ED bill.   Jacalyn Lefevre, MD 11/09/20 1113

## 2020-11-13 ENCOUNTER — Ambulatory Visit (INDEPENDENT_AMBULATORY_CARE_PROVIDER_SITE_OTHER): Payer: PRIVATE HEALTH INSURANCE | Admitting: Sports Medicine

## 2020-11-13 ENCOUNTER — Other Ambulatory Visit: Payer: Self-pay

## 2020-11-13 ENCOUNTER — Ambulatory Visit (INDEPENDENT_AMBULATORY_CARE_PROVIDER_SITE_OTHER): Payer: PRIVATE HEALTH INSURANCE

## 2020-11-13 DIAGNOSIS — S42252A Displaced fracture of greater tuberosity of left humerus, initial encounter for closed fracture: Secondary | ICD-10-CM | POA: Diagnosis not present

## 2020-11-13 IMAGING — DX DG SHOULDER 2+V*L*
3 series · 4 of 4 positions shown · non-contrast
Comparison: Left shoulder and humerus x-rays dated [DATE].

CLINICAL DATA: Follow-up left proximal humerus fracture.

EXAM:
LEFT SHOULDER - 2+ VIEW

[shoulder grashey]
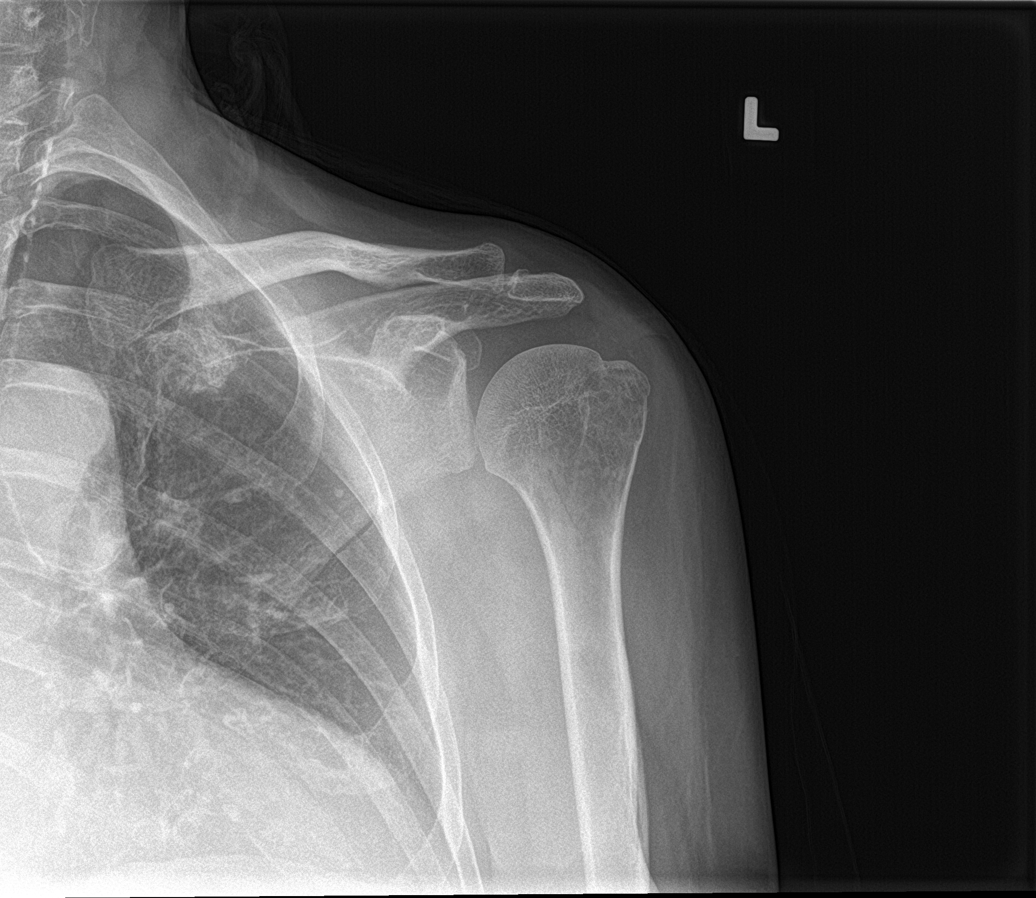

[Series 2: shoulder y view · 0.14mm/px · 2 of 2 slices shown]
[im 1/2]
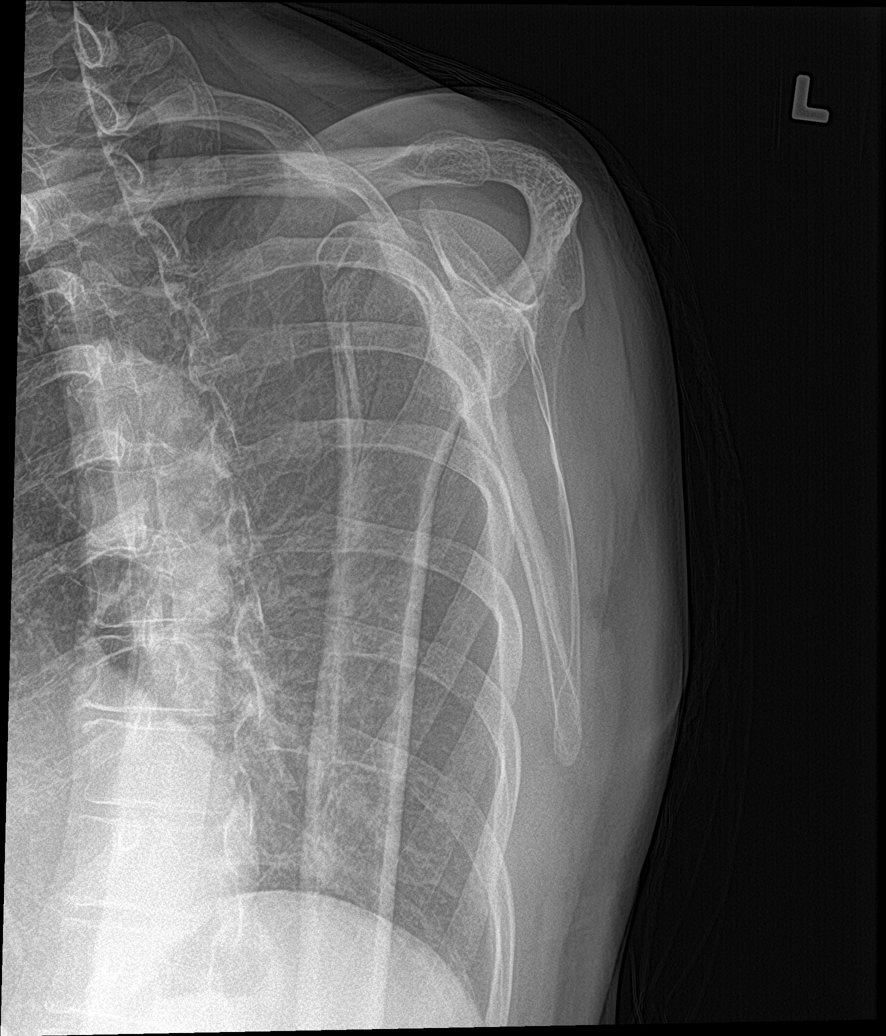
[im 2/2]
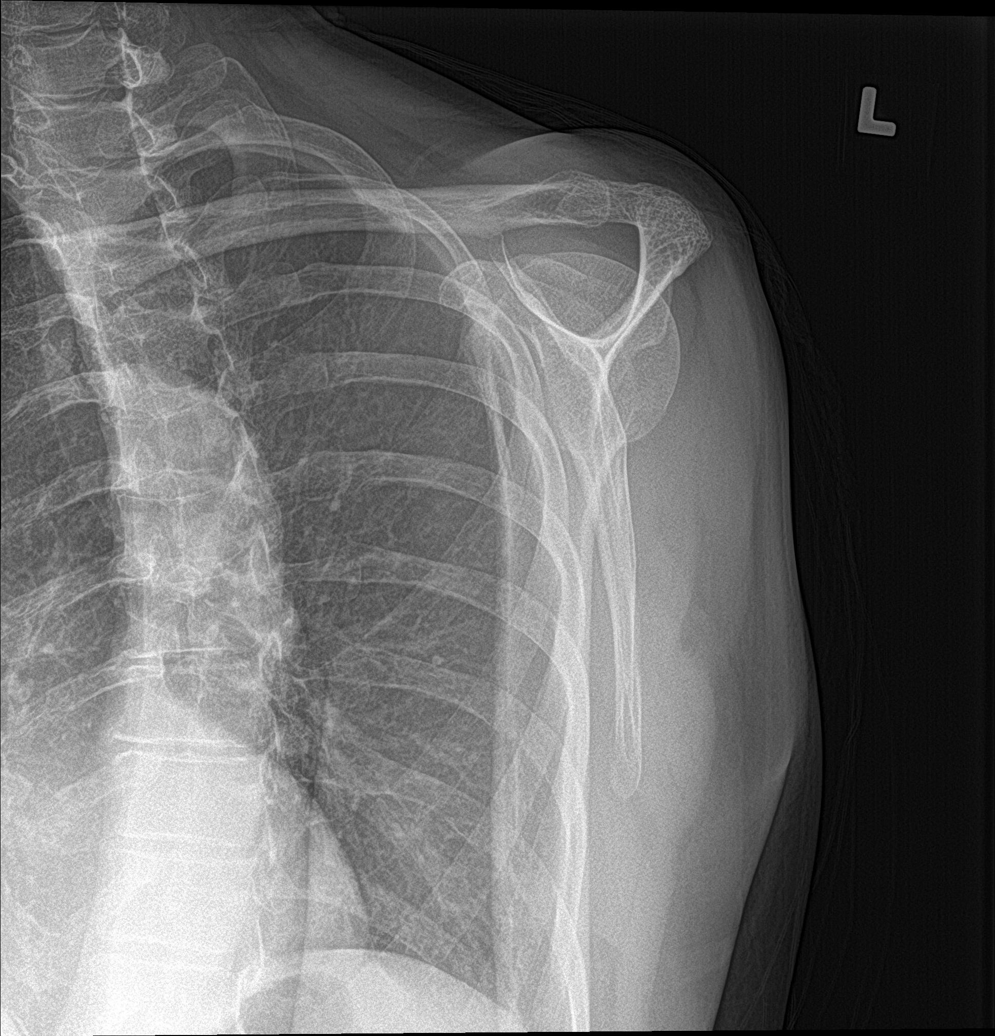

[shoulder axillary]
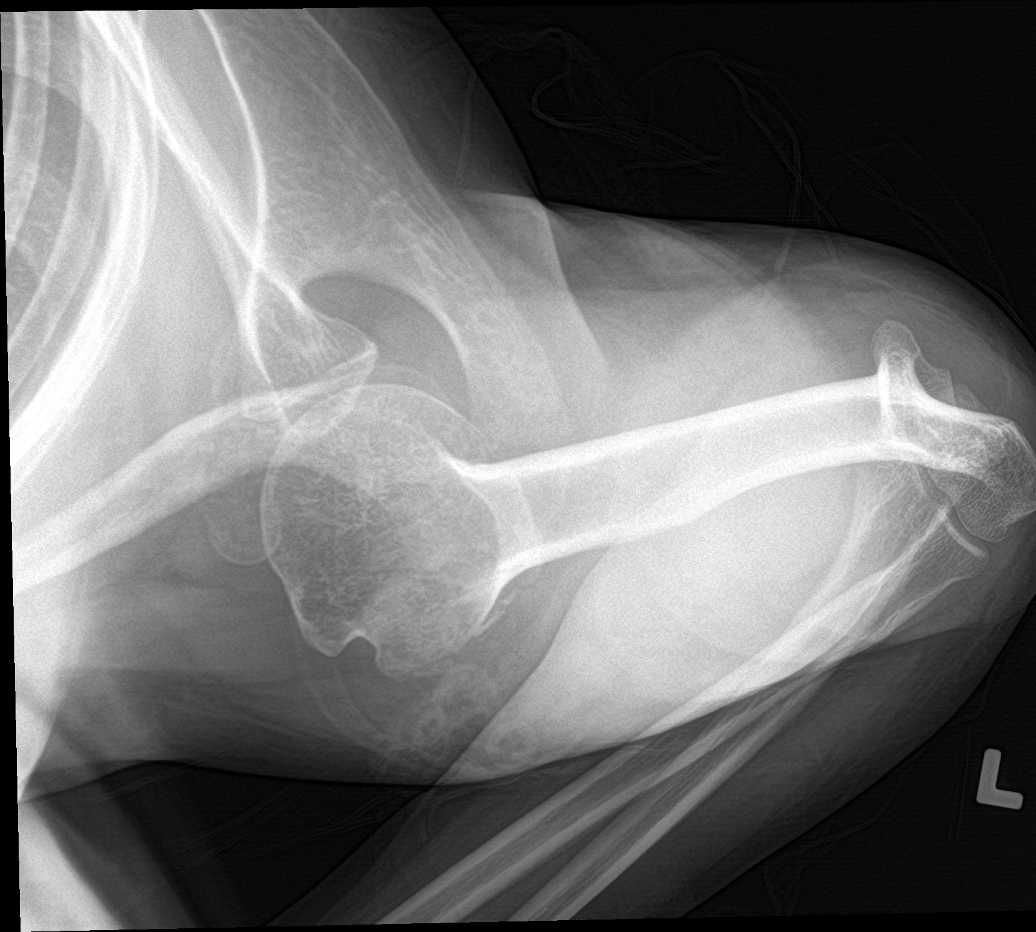

[4 of 4 positions shown; findings below may reference images not displayed]

FINDINGS: Acute nondisplaced fracture of the proximal humerus and greater
tuberosity is unchanged, with minimal impaction of the greater
tuberosity. No dislocation. Joint spaces are preserved. Soft tissues
are unremarkable.
IMPRESSION: 1. Unchanged acute nondisplaced proximal humerus fracture.

## 2020-11-13 NOTE — Assessment & Plan Note (Signed)
[name], she is a prolific runner, 8 miles per day. 4 days ago she suffered an accidental fall, hit her left shoulder, she was seen in the urgent care where x-rays showed what appears to be a 2 part humeral head fracture with minimal displacement of the greater tuberosity, no intra-articular component. She will continue a cuff and collar, Tylenol as needed for pain which seems sufficient, after 2 weeks we will discontinue the cuff and collar and likely get her into gentle passive motion with physical therapy. She understands this will take 6 to 8 weeks to heal and she will likely have some frozen shoulder that will necessitate additional physical therapy at the end of her healing. Repeat x-rays today. She hopes to be running in 4 weeks.

## 2020-11-13 NOTE — Progress Notes (Signed)
    Procedures performed today:    None.  Independent interpretation of notes and tests performed by another provider:   Initial x-rays reviewed, there is a minimally displaced fracture of the greater tuberosity, I do not see an intra-articular component, no angulation at the anatomical or surgical neck of the humerus.  Brief History, Exam, Impression, and Recommendations:    Fracture of greater tuberosity of left humerus [name], she is a Market researcher runner, 8 miles per day. 4 days ago she suffered an accidental fall, hit her left shoulder, she was seen in the urgent care where x-rays showed what appears to be a 2 part humeral head fracture with minimal displacement of the greater tuberosity, no intra-articular component. She will continue a cuff and collar, Tylenol as needed for pain which seems sufficient, after 2 weeks we will discontinue the cuff and collar and likely get her into gentle passive motion with physical therapy. She understands this will take 6 to 8 weeks to heal and she will likely have some frozen shoulder that will necessitate additional physical therapy at the end of her healing. Repeat x-rays today. She hopes to be running in 4 weeks.    ___________________________________________ Ihor Austin. Benjamin Stain, M.D., ABFM., CAQSM. Primary Care and Sports Medicine [facility]  Adjunct Instructor of Family Medicine  [employer]

## 2020-11-22 NOTE — Progress Notes (Signed)
Carelink Summary Report / Loop Recorder 

## 2020-11-27 ENCOUNTER — Other Ambulatory Visit: Payer: Self-pay

## 2020-11-27 ENCOUNTER — Ambulatory Visit (INDEPENDENT_AMBULATORY_CARE_PROVIDER_SITE_OTHER): Payer: PRIVATE HEALTH INSURANCE | Admitting: Sports Medicine

## 2020-11-27 DIAGNOSIS — S42252D Displaced fracture of greater tuberosity of left humerus, subsequent encounter for fracture with routine healing: Secondary | ICD-10-CM | POA: Diagnosis not present

## 2020-11-27 NOTE — Progress Notes (Signed)
    Procedures performed today:    None.  Independent interpretation of notes and tests performed by another provider:   None.  Brief History, Exam, Impression, and Recommendations:    Fracture of greater tuberosity of left humerus [name], she is now approximately 2.5 weeks post a fall with a 2 part humeral fracture with only minimal displacement of the greater tuberosity and no intra-articular component. We continued her cuff and collar, she is going to discontinue her hydrocodone and only use some Benadryl to help her sleep. She does have some tenderness over the greater tuberosity itself so we will need to continue her immobilization and the cuff and collar. She is interested in starting gentle passive range of motion exercises at home which I think is okay to start now, I think it is also okay for her to go ahead and come out of the cuff and collar to do some gentle running and then go back into it. Intermittent cuff and collar use should be continued for 1.5 weeks, at that point she can discontinue the cuff and collar. I would like to see her back in person in approximately 3 weeks.    ___________________________________________ Ihor Austin. Benjamin Stain, M.D., ABFM., CAQSM. Primary Care and Sports Medicine [facility]  Adjunct Instructor of Family Medicine  [employer]

## 2020-11-27 NOTE — Assessment & Plan Note (Signed)
[name], she is now approximately 2.5 weeks post a fall with a 2 part humeral fracture with only minimal displacement of the greater tuberosity and no intra-articular component. We continued her cuff and collar, she is going to discontinue her hydrocodone and only use some Benadryl to help her sleep. She does have some tenderness over the greater tuberosity itself so we will need to continue her immobilization and the cuff and collar. She is interested in starting gentle passive range of motion exercises at home which I think is okay to start now, I think it is also okay for her to go ahead and come out of the cuff and collar to do some gentle running and then go back into it. Intermittent cuff and collar use should be continued for 1.5 weeks, at that point she can discontinue the cuff and collar. I would like to see her back in person in approximately 3 weeks.

## 2020-11-27 NOTE — Patient Instructions (Signed)
Fracture of greater tuberosity of left humerus [name], she is now approximately 2.5 weeks post a fall with a 2 part humeral fracture with only minimal displacement of the greater tuberosity and no intra-articular component. We continued her cuff and collar, she is going to discontinue her hydrocodone and only use some Benadryl to help her sleep. She does have some tenderness over the greater tuberosity itself so we will need to continue her immobilization and the cuff and collar. She is interested in starting gentle passive range of motion exercises at home which I think is okay to start now, I think it is also okay for her to go ahead and come out of the cuff and collar to do some gentle running and then go back into it. Intermittent cuff and collar use should be continued for 1.5 weeks, at that point she can discontinue the cuff and collar. I would like to see her back in person in approximately 3 weeks.

## 2020-12-06 ENCOUNTER — Ambulatory Visit (INDEPENDENT_AMBULATORY_CARE_PROVIDER_SITE_OTHER): Payer: 59

## 2020-12-06 DIAGNOSIS — I639 Cerebral infarction, unspecified: Secondary | ICD-10-CM | POA: Diagnosis not present

## 2020-12-08 LAB — CUP PACEART REMOTE DEVICE CHECK
Date Time Interrogation Session: 20220729145533
Implantable Pulse Generator Implant Date: 20211209

## 2020-12-11 DIAGNOSIS — S42252D Displaced fracture of greater tuberosity of left humerus, subsequent encounter for fracture with routine healing: Secondary | ICD-10-CM

## 2020-12-11 MED ORDER — HYDROCODONE-ACETAMINOPHEN 5-325 MG PO TABS: 1.0000 | ORAL_TABLET | Freq: Four times a day (QID) | ORAL | 0 refills | Status: DC | PRN

## 2020-12-11 NOTE — Telephone Encounter (Signed)
I spent 5 total minutes of online digital evaluation and management services. 

## 2020-12-18 ENCOUNTER — Ambulatory Visit (INDEPENDENT_AMBULATORY_CARE_PROVIDER_SITE_OTHER): Payer: PRIVATE HEALTH INSURANCE | Admitting: Sports Medicine

## 2020-12-18 ENCOUNTER — Other Ambulatory Visit: Payer: Self-pay

## 2020-12-18 DIAGNOSIS — S42252D Displaced fracture of greater tuberosity of left humerus, subsequent encounter for fracture with routine healing: Secondary | ICD-10-CM | POA: Diagnosis not present

## 2020-12-18 MED ORDER — HYDROCODONE-ACETAMINOPHEN 5-325 MG PO TABS: 0.5000 | ORAL_TABLET | Freq: Every day | ORAL | 0 refills | Status: DC

## 2020-12-18 NOTE — Assessment & Plan Note (Signed)
[name], she is approximately 5 and half weeks post fracture of the greater tuberosity, only minimally displaced. She is doing really well out of her cuff and collar, good motion, fairly good strength, she still has about 45 degrees of external rotation lag and she is a bit weak to abduction, she will continue to work on her conditioning, I am going to refill her hydrocodone which she only uses at night, this will be the last refill. Return to see me in a month. If she does not make good progress with home exercise program over the next month we will place her into formal physical therapy.

## 2020-12-18 NOTE — Progress Notes (Signed)
    Procedures performed today:    None.  Independent interpretation of notes and tests performed by another provider:   None.  Brief History, Exam, Impression, and Recommendations:    Fracture of greater tuberosity of left humerus [name], she is approximately 5 and half weeks post fracture of the greater tuberosity, only minimally displaced. She is doing really well out of her cuff and collar, good motion, fairly good strength, she still has about 45 degrees of external rotation lag and she is a bit weak to abduction, she will continue to work on her conditioning, I am going to refill her hydrocodone which she only uses at night, this will be the last refill. Return to see me in a month. If she does not make good progress with home exercise program over the next month we will place her into formal physical therapy.    ___________________________________________ Ihor Austin. Benjamin Stain, M.D., ABFM., CAQSM. Primary Care and Sports Medicine [facility]  Adjunct Instructor of Family Medicine  [employer]

## 2021-01-02 NOTE — Progress Notes (Signed)
Carelink Summary Report / Loop Recorder 

## 2021-01-07 ENCOUNTER — Ambulatory Visit (INDEPENDENT_AMBULATORY_CARE_PROVIDER_SITE_OTHER): Payer: 59

## 2021-01-07 ENCOUNTER — Other Ambulatory Visit: Payer: Self-pay | Admitting: Internal Medicine

## 2021-01-07 DIAGNOSIS — I639 Cerebral infarction, unspecified: Secondary | ICD-10-CM

## 2021-01-10 LAB — CUP PACEART REMOTE DEVICE CHECK
Date Time Interrogation Session: 20220831145543
Implantable Pulse Generator Implant Date: 20211209

## 2021-01-15 ENCOUNTER — Ambulatory Visit: Payer: PRIVATE HEALTH INSURANCE | Admitting: Sports Medicine

## 2021-01-18 NOTE — Progress Notes (Signed)
Carelink Summary Report / Loop Recorder 

## 2021-02-07 ENCOUNTER — Ambulatory Visit (INDEPENDENT_AMBULATORY_CARE_PROVIDER_SITE_OTHER): Payer: 59

## 2021-02-07 DIAGNOSIS — I6300 Cerebral infarction due to thrombosis of unspecified precerebral artery: Secondary | ICD-10-CM

## 2021-02-12 LAB — CUP PACEART REMOTE DEVICE CHECK
Date Time Interrogation Session: 20221002000428
Implantable Pulse Generator Implant Date: 20211209

## 2021-02-14 NOTE — Progress Notes (Signed)
Carelink Summary Report / Loop Recorder 

## 2021-02-22 ENCOUNTER — Telehealth: Payer: Self-pay

## 2021-02-22 NOTE — Telephone Encounter (Signed)
The patient called to see if we are aware of any issues about the app for her loop recorder because it did not update for 2 days. I let her know that we are not aware of any issues with the app. I told her if [employer] send her a message to connect then to go into the app to reconnect with it. She wanted to know why we did not know she was not connected for 2 days. I told her we would not know if the app was not connected for 2 days. I told [employer] send [place] a disconnected monitor alert after 15 days. Then we will reach out to the patient to connect with her app. She was not happy about that. I let her we have no control on when we get the disconnected monitor notice. I told her if she feels like she in A-fib or having some symptoms she can call the office and we could also bring her in to retrieve the loop information. She states she is going to think about if she wants to keep the loop or not.

## 2021-03-01 MED ORDER — ROSUVASTATIN CALCIUM 10 MG PO TABS: 10.0000 mg | ORAL_TABLET | Freq: Every day | ORAL | 1 refills | Status: DC

## 2021-03-01 NOTE — Telephone Encounter (Signed)
Sure  lets try crestor 10 mg per day   We can adjust dose up or down depending.  Plan lipid panel lab in about 3 months on the medication And let us know  concerns  in interm

## 2021-03-11 ENCOUNTER — Ambulatory Visit (INDEPENDENT_AMBULATORY_CARE_PROVIDER_SITE_OTHER): Payer: 59

## 2021-03-11 DIAGNOSIS — I6300 Cerebral infarction due to thrombosis of unspecified precerebral artery: Secondary | ICD-10-CM

## 2021-03-18 LAB — CUP PACEART REMOTE DEVICE CHECK
Date Time Interrogation Session: 20221105145923
Implantable Pulse Generator Implant Date: 20211209

## 2021-03-19 NOTE — Progress Notes (Signed)
Carelink Summary Report / Loop Recorder 

## 2021-04-11 ENCOUNTER — Ambulatory Visit (INDEPENDENT_AMBULATORY_CARE_PROVIDER_SITE_OTHER): Payer: 59

## 2021-04-11 DIAGNOSIS — I6300 Cerebral infarction due to thrombosis of unspecified precerebral artery: Secondary | ICD-10-CM | POA: Diagnosis not present

## 2021-04-12 ENCOUNTER — Telehealth: Payer: Self-pay | Admitting: Family Medicine

## 2021-04-12 ENCOUNTER — Ambulatory Visit: Payer: Self-pay

## 2021-04-12 ENCOUNTER — Other Ambulatory Visit: Payer: Self-pay

## 2021-04-12 ENCOUNTER — Encounter: Payer: Self-pay | Admitting: Family Medicine

## 2021-04-12 ENCOUNTER — Ambulatory Visit (INDEPENDENT_AMBULATORY_CARE_PROVIDER_SITE_OTHER): Payer: 59 | Admitting: Family Medicine

## 2021-04-12 ENCOUNTER — Ambulatory Visit (HOSPITAL_BASED_OUTPATIENT_CLINIC_OR_DEPARTMENT_OTHER)
Admission: RE | Admit: 2021-04-12 | Discharge: 2021-04-12 | Disposition: A | Discharge status: Home / Self Care | Payer: 59 | Source: Ambulatory Visit | Attending: Family Medicine | Admitting: Family Medicine

## 2021-04-12 VITALS — BP 130/90 | Ht 63.0 in | Wt 120.0 lb

## 2021-04-12 DIAGNOSIS — S92351A Displaced fracture of fifth metatarsal bone, right foot, initial encounter for closed fracture: Secondary | ICD-10-CM | POA: Insufficient documentation

## 2021-04-12 DIAGNOSIS — S92353A Displaced fracture of fifth metatarsal bone, unspecified foot, initial encounter for closed fracture: Secondary | ICD-10-CM | POA: Insufficient documentation

## 2021-04-12 DIAGNOSIS — M25571 Pain in right ankle and joints of right foot: Secondary | ICD-10-CM

## 2021-04-12 LAB — CUP PACEART REMOTE DEVICE CHECK
Date Time Interrogation Session: 20221201121739
Implantable Pulse Generator Implant Date: 20211209

## 2021-04-12 IMAGING — DX DG FOOT COMPLETE 3+V*R*
3 series · 3 of 3 positions shown · non-contrast
Comparison: None.

CLINICAL DATA: Closed fracture of the base of the 5th metatarsal.
Recent injury.

EXAM:
RIGHT FOOT COMPLETE - 3+ VIEW

[foot ap]
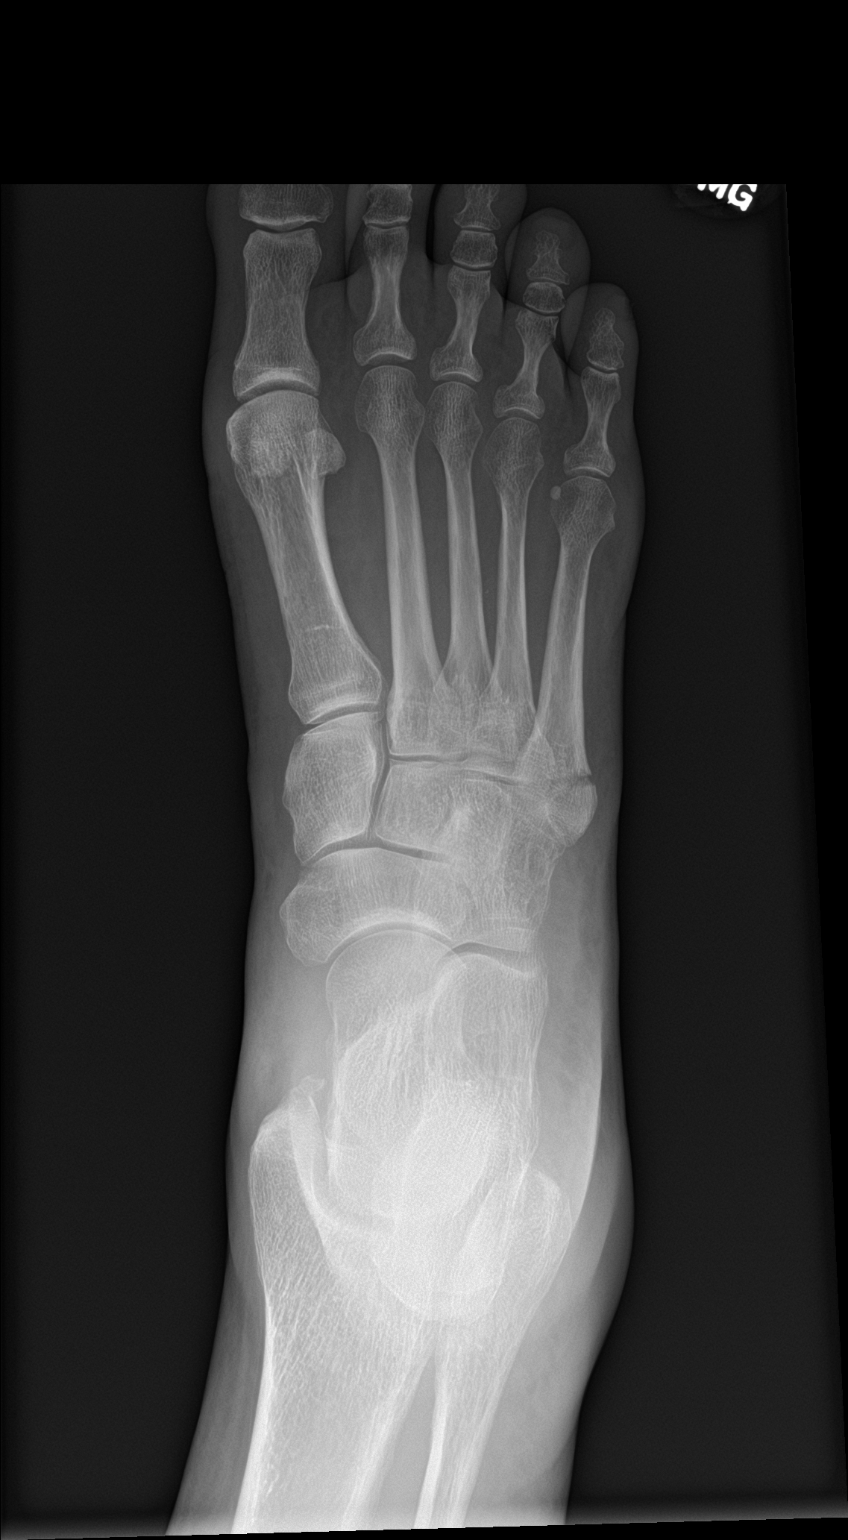

[foot obl]
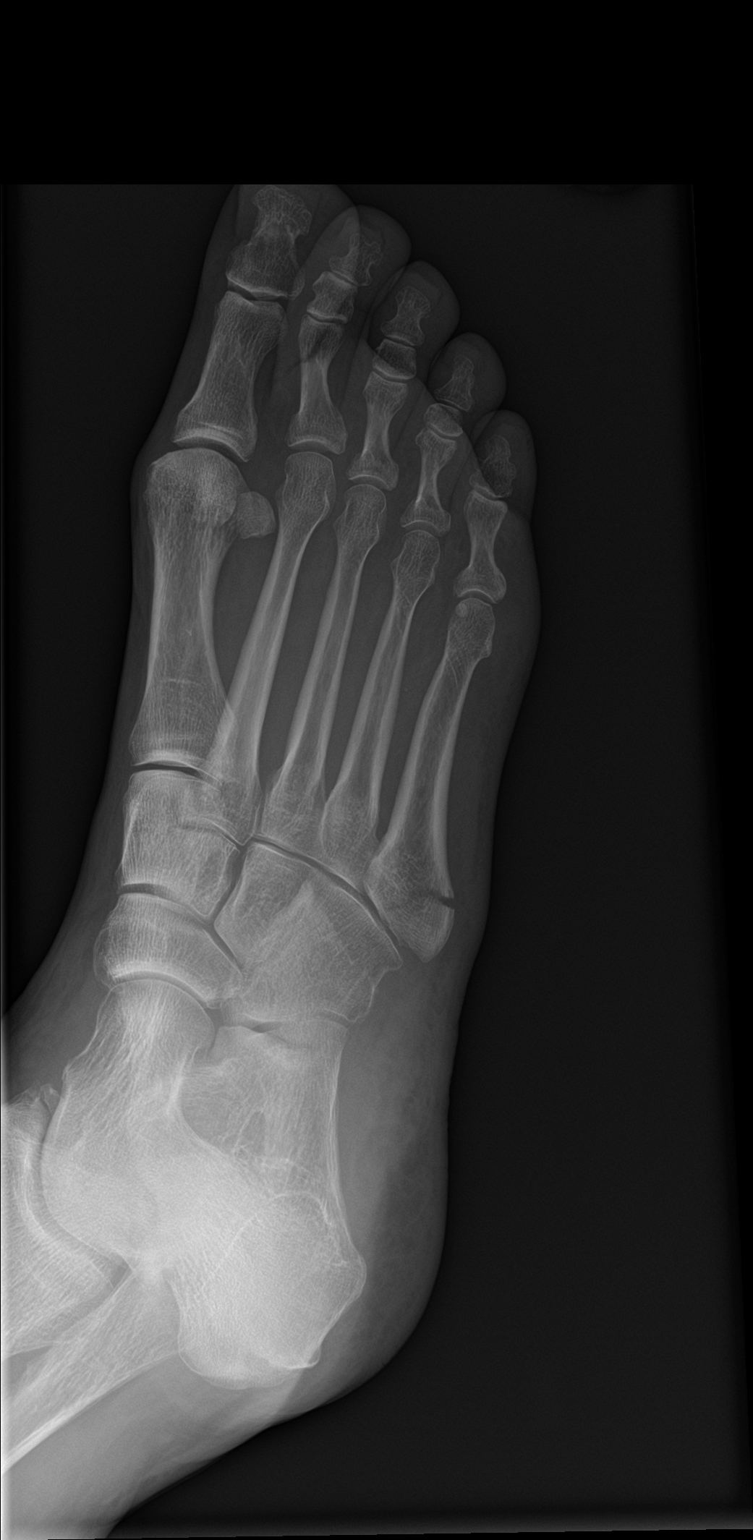

[foot lat]
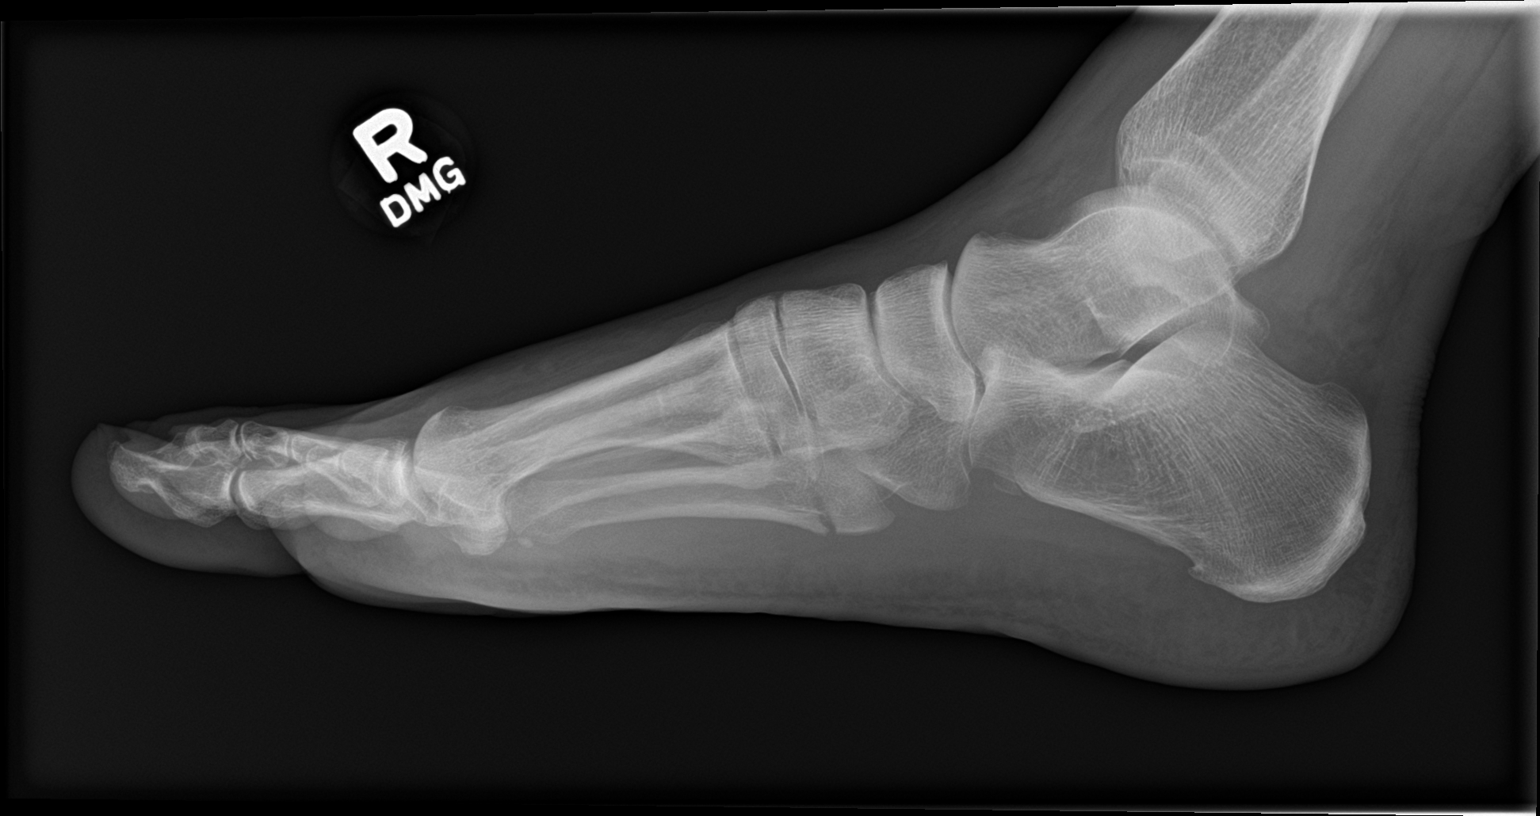

[3 of 3 positions shown; findings below may reference images not displayed]

FINDINGS: There is a minimally displaced intra-articular avulsion fracture of
the 5th metatarsal base. No other evidence of acute fracture or
dislocation. The mineralization and alignment are normal. Mild
lateral soft tissue swelling noted without foreign body or soft
tissue emphysema.
IMPRESSION: Minimally displaced intra-articular avulsion fracture of the 5th
metatarsal base.

## 2021-04-12 NOTE — Telephone Encounter (Signed)
Informed of results.   Myra Rude, MD [facility] 04/12/2021, 12:45 PM

## 2021-04-12 NOTE — Patient Instructions (Signed)
Nice to meet you Please use the crutches if pain with any weight bearing  Please use ice as needed  We'll call with the results.   Please send me a message in MyChart with any questions or updates.  Please see me back in 3 weeks.   --Dr. Jordan Likes

## 2021-04-12 NOTE — Progress Notes (Signed)
Vicki Stevens - 65 y.o. female MRN [id number]  Date of birth: June 06, 1955  SUBJECTIVE:  Including CC & ROS.  No chief complaint on file.   Vicki Stevens is a 65 y.o. female that is presenting with acute foot pain.  She had a inversion injury yesterday.  Since that time she has had swelling and pain at the midfoot.  Having pain with weightbearing.    Review of Systems See HPI   HISTORY: Past Medical, Surgical, Social, and Family History Reviewed & Updated per EMR.   Pertinent Historical Findings include:  Past Medical History:  Diagnosis Date   COVID-19 2021   H/O hematuria remote   dr Terance Hart felt to be from running    Heart murmur    evaluation ? mvp when younger   Hx of varicella    Hypertension    LOW BACK PAIN, ACUTE 03/05/2010   Stroke ([place]) 04/17/2020    Past Surgical History:  Procedure Laterality Date   BUBBLE STUDY  04/19/2020   Procedure: BUBBLE STUDY;  Surgeon: Elouise Munroe, MD;  Location: [facility] ENDOSCOPY;  Service: Cardiology;;   LOOP RECORDER INSERTION N/A 04/19/2020   Procedure: LOOP RECORDER INSERTION;  Surgeon: Vickie Epley, MD;  Location: [place] [facility] LAB;  Service: Cardiovascular;  Laterality: N/A;   TEE WITHOUT CARDIOVERSION N/A 04/19/2020   Procedure: TRANSESOPHAGEAL ECHOCARDIOGRAM (TEE);  Surgeon: Elouise Munroe, MD;  Location: [place];  Service: Cardiology;  Laterality: N/A;    Family History  Problem Relation Age of Onset   Other Mother        MVA 6    Lung cancer Father        42    Social History   Socioeconomic History   Marital status: Married    Spouse name: Danella Deis   Number of children: Not on file   Years of education: Not on file   Highest education level: Not on file  Occupational History   Occupation: part time/self employed  Tobacco Use   Smoking status: Never   Smokeless tobacco: Never  Vaping Use   Vaping Use: Never used  Substance and Sexual Activity   Alcohol use: Yes    Comment: occasonally    Drug use: Never   Sexual activity: Not on file  Other Topics Concern   Not on file  Social History Narrative   Lives with Husband   caffeine 2-3 in am ocass    Right handed   Social Determinants of Health   Financial Resource Strain: Not on file  Food Insecurity: Not on file  Transportation Needs: Not on file  Physical Activity: Not on file  Stress: Not on file  Social Connections: Not on file  Intimate Partner Violence: Not on file     PHYSICAL EXAM:  VS: BP 130/90 (BP Location: Left Arm, Patient Position: Sitting)   Ht 5\' 3"  (1.6 m)   Wt 120 lb (54.4 kg)   BMI 21.26 kg/m  Physical Exam Gen: NAD, alert, cooperative with exam, well-appearing   Limited ultrasound: Right foot:  Swelling appreciated in the soft tissue overlying the peroneal tendons. Normal-appearing distal fibula and lateral malleolus. Normal-appearing joint space. Normal insertion of the peroneal brevis to the base of the fifth. Fracture appreciated of the base of the fifth metatarsal  Summary: Base of fifth metatarsal fracture  Ultrasound and interpretation by Clearance Coots, MD     ASSESSMENT & PLAN:   Closed fracture of base of fifth metatarsal bone Initial  injury on 12/1.  Inversion injury with fracture on ultrasound appreciated. -Counseled on home exercise therapy and supportive care. -Cam walker today. -X-ray. -Could consider checking vitamin D and bone density.

## 2021-04-12 NOTE — Assessment & Plan Note (Signed)
Initial injury on 12/1.  Inversion injury with fracture on ultrasound appreciated. -Counseled on home exercise therapy and supportive care. -Cam walker today. -X-ray. -Could consider checking vitamin D and bone density.

## 2021-04-15 ENCOUNTER — Ambulatory Visit: Payer: 59 | Admitting: Family Medicine

## 2021-04-23 ENCOUNTER — Encounter: Payer: Self-pay | Admitting: Family Medicine

## 2021-04-23 NOTE — Progress Notes (Signed)
Carelink Summary Report / Loop Recorder 

## 2021-05-14 ENCOUNTER — Ambulatory Visit (INDEPENDENT_AMBULATORY_CARE_PROVIDER_SITE_OTHER): Payer: 59

## 2021-05-14 DIAGNOSIS — I6300 Cerebral infarction due to thrombosis of unspecified precerebral artery: Secondary | ICD-10-CM

## 2021-05-15 LAB — CUP PACEART REMOTE DEVICE CHECK
Date Time Interrogation Session: 20230102230601
Implantable Pulse Generator Implant Date: 20211209

## 2021-05-24 NOTE — Progress Notes (Signed)
Carelink Summary Report / Loop Recorder 

## 2021-06-11 ENCOUNTER — Encounter: Payer: Self-pay | Admitting: Internal Medicine

## 2021-06-13 ENCOUNTER — Ambulatory Visit (INDEPENDENT_AMBULATORY_CARE_PROVIDER_SITE_OTHER): Payer: 59

## 2021-06-13 DIAGNOSIS — I6300 Cerebral infarction due to thrombosis of unspecified precerebral artery: Secondary | ICD-10-CM

## 2021-06-13 LAB — CUP PACEART REMOTE DEVICE CHECK
Date Time Interrogation Session: 20230201231254
Implantable Pulse Generator Implant Date: 20211209

## 2021-06-17 ENCOUNTER — Ambulatory Visit (INDEPENDENT_AMBULATORY_CARE_PROVIDER_SITE_OTHER): Payer: 59 | Admitting: Internal Medicine

## 2021-06-17 ENCOUNTER — Encounter: Payer: Self-pay | Admitting: Internal Medicine

## 2021-06-17 VITALS — BP 114/70 | Temp 98.5°F | Ht 63.0 in | Wt 128.0 lb

## 2021-06-17 DIAGNOSIS — E2839 Other primary ovarian failure: Secondary | ICD-10-CM | POA: Diagnosis not present

## 2021-06-17 DIAGNOSIS — Z79899 Other long term (current) drug therapy: Secondary | ICD-10-CM

## 2021-06-17 DIAGNOSIS — I10 Essential (primary) hypertension: Secondary | ICD-10-CM | POA: Diagnosis not present

## 2021-06-17 DIAGNOSIS — Z8673 Personal history of transient ischemic attack (TIA), and cerebral infarction without residual deficits: Secondary | ICD-10-CM

## 2021-06-17 DIAGNOSIS — Z0000 Encounter for general adult medical examination without abnormal findings: Secondary | ICD-10-CM | POA: Diagnosis not present

## 2021-06-17 DIAGNOSIS — Z9189 Other specified personal risk factors, not elsewhere classified: Secondary | ICD-10-CM

## 2021-06-17 DIAGNOSIS — Z5320 Procedure and treatment not carried out because of patient's decision for unspecified reasons: Secondary | ICD-10-CM | POA: Diagnosis not present

## 2021-06-17 DIAGNOSIS — E782 Mixed hyperlipidemia: Secondary | ICD-10-CM

## 2021-06-17 LAB — CBC WITH DIFFERENTIAL/PLATELET
Basophils Absolute: 0.1 10*3/uL (ref 0.0–0.1)
Basophils Relative: 0.9 % (ref 0.0–3.0)
Eosinophils Absolute: 0.5 10*3/uL (ref 0.0–0.7)
Eosinophils Relative: 7.4 % — ABNORMAL HIGH (ref 0.0–5.0)
HCT: 42.9 % (ref 36.0–46.0)
Hemoglobin: 14.3 g/dL (ref 12.0–15.0)
Lymphocytes Relative: 23 % (ref 12.0–46.0)
Lymphs Abs: 1.4 10*3/uL (ref 0.7–4.0)
MCHC: 33.4 g/dL (ref 30.0–36.0)
MCV: 92.1 fl (ref 78.0–100.0)
Monocytes Absolute: 0.3 10*3/uL (ref 0.1–1.0)
Monocytes Relative: 5.4 % (ref 3.0–12.0)
Neutro Abs: 4 10*3/uL (ref 1.4–7.7)
Neutrophils Relative %: 63.3 % (ref 43.0–77.0)
Platelets: 293 10*3/uL (ref 150.0–400.0)
RBC: 4.65 Mil/uL (ref 3.87–5.11)
RDW: 12.9 % (ref 11.5–15.5)
WBC: 6.3 10*3/uL (ref 4.0–10.5)

## 2021-06-17 LAB — BASIC METABOLIC PANEL
BUN: 14 mg/dL (ref 6–23)
CO2: 31 mEq/L (ref 19–32)
Calcium: 9.4 mg/dL (ref 8.4–10.5)
Chloride: 101 mEq/L (ref 96–112)
Creatinine, Ser: 0.55 mg/dL (ref 0.40–1.20)
GFR: 96.57 mL/min (ref >60.00)
Glucose, Bld: 93 mg/dL (ref 70–99)
Potassium: 3.9 mEq/L (ref 3.5–5.1)
Sodium: 138 mEq/L (ref 135–145)

## 2021-06-17 LAB — LIPID PANEL
Cholesterol: 161 mg/dL (ref 0–200)
HDL: 75.1 mg/dL (ref >39.00)
LDL Cholesterol: 76 mg/dL (ref 0–99)
NonHDL: 86.05
Total CHOL/HDL Ratio: 2
Triglycerides: 49 mg/dL (ref 0.0–149.0)
VLDL: 9.8 mg/dL (ref 0.0–40.0)

## 2021-06-17 LAB — HEPATIC FUNCTION PANEL
ALT: 20 U/L (ref 0–35)
AST: 19 U/L (ref 0–37)
Albumin: 4.7 g/dL (ref 3.5–5.2)
Alkaline Phosphatase: 68 U/L (ref 39–117)
Bilirubin, Direct: 0.1 mg/dL (ref 0.0–0.3)
Total Bilirubin: 0.6 mg/dL (ref 0.2–1.2)
Total Protein: 7.3 g/dL (ref 6.0–8.3)

## 2021-06-17 LAB — HEMOGLOBIN A1C: Hgb A1c MFr Bld: 5.4 % (ref 4.6–6.5)

## 2021-06-17 LAB — TSH: TSH: 1.08 u[IU]/mL (ref 0.35–5.50)

## 2021-06-17 NOTE — Patient Instructions (Signed)
Good to see you today .  Lab today . Same meds   Will order coronary calcium ct for march and beyond.   Call for  appt for dexa scan   le [facility] [address] [place]. [place] [address] (Across from [facility] ) [phone] 8:30AM-5:15 pm  Yearly check or as indicated .

## 2021-06-17 NOTE — Progress Notes (Signed)
Chief Complaint  Patient presents with   Annual Exam    fsating    HPI: Patient  ANGLE DOTO  66 y.o. comes in today for Preventive Health Care visit and medication. Hypertension taking lisinopril 10 twice daily seems to control blood pressure which rises under acute stress but otherwise is well controlled has log mostly 120/80 and below some 130s and occasional 140s+. Wants blood type.  Done.  Interested thinks it is important for her help Since 2015  elevated lipids .   Back on the Crestor since December .  Interested in checking CAC score. Hsa loop recorder follow cards  No further CNS coronary symptoms but had an evaluation when she had the CVA. Diet.  Is good exercises runs In the past year she did have 2 falls when she was running tripping over object in an uneven ground fracture of the left humerus has since totally rehab and fracture of the right base of the fifth metatarsal where her foot twisted.  She is totally recovered from both and continues to run. Interested in planning a bone density.  Feels she has a balanced diet.    medicare . Order after march 1 .  Declines screening colon and mammo Health Maintenance  Topic Date Due   TETANUS/TDAP  05/12/2016   Hepatitis C Screening  07/30/2021 (Originally 08/10/1974)   INFLUENZA VACCINE  12/15/2021 (Originally 12/10/2020)   MAMMOGRAM  12/15/2021 (Originally 08/10/2006)   PAP SMEAR-Modifier  12/15/2021 (Originally 07/10/2016)   COLONOSCOPY (Pts 45-39yrs Insurance coverage will need to be confirmed)  12/15/2021 (Originally 08/09/2001)   COVID-19 Vaccine (2 - Janssen risk series) 12/15/2021 (Originally 09/19/2019)   Zoster Vaccines- Shingrix (1 of 2) 12/15/2021 (Originally 08/10/1975)   HIV Screening  Completed   HPV VACCINES  Aged Out   Health Maintenance Review LIFESTYLE:  Exercise:   lots  3- 8 miles per day  running  Tobacco/ETS: n Alcohol: little  Sugar beverages: Sleep: well  6-7 hours  Drug use: no HH of   2 dog  Work:  hours per month .   Beet gummy    ROS:  GEN/ HEENT: No fever, significant weight changes sweats headaches vision problems hearing changes, CV/ PULM; No chest pain shortness of breath cough, syncope,edema  change in exercise tolerance. GI /GU: No adominal pain, vomiting, change in bowel habits. No blood in the stool. No significant GU symptoms. SKIN/HEME: ,no acute skin rashes suspicious lesions or bleeding. No lymphadenopathy, nodules, masses.  NEURO/ PSYCH:  No neurologic signs such as weakness numbness. No depression anxiety. IMM/ Allergy: No unusual infections.  Allergy .   REST of 12 system review negative except as per HPI   Past Medical History:  Diagnosis Date   COVID-19 2021   H/O hematuria remote   dr Vonita Moss felt to be from running    Heart murmur    evaluation ? mvp when younger   Hx of varicella    Hypertension    LOW BACK PAIN, ACUTE 03/05/2010   Stroke ([facility]) 04/17/2020    Past Surgical History:  Procedure Laterality Date   BUBBLE STUDY  04/19/2020   Procedure: BUBBLE STUDY;  Surgeon: Parke Poisson, MD;  Location[facility] ENDOSCOPY;  Service: Cardiology;;   LOOP RECORDER INSERTION N/A 04/19/2020   Procedure: LOOP RECORDER INSERTION;  Surgeon: Lanier Prude, MD;  Location: [facility];  Service: Cardiovascular;  Laterality: N/A;   TEE WITHOUT CARDIOVERSION N/A 04/19/2020   Procedure: TRANSESOPHAGEAL ECHOCARDIOGRAM (TEE);  Surgeon: Elouise Munroe, MD;  Location: [place];  Service: Cardiology;  Laterality: N/A;    Family History  Problem Relation Age of Onset   Other Mother        MVA 50    Stroke Father    Lung cancer Father        69    Social History   Socioeconomic History   Marital status: Married    Spouse name: Danella Deis   Number of children: Not on file   Years of education: Not on file   Highest education level: Not on file  Occupational History   Occupation: part time/self employed  Tobacco Use   Smoking status: Never    Smokeless tobacco: Never  Vaping Use   Vaping Use: Never used  Substance and Sexual Activity   Alcohol use: Yes    Comment: occasonally   Drug use: Never   Sexual activity: Not on file  Other Topics Concern   Not on file  Social History Narrative   Lives with Husband   caffeine 2-3 in am ocass    Right handed   Social Determinants of Health   Financial Resource Strain: Not on file  Food Insecurity: Not on file  Transportation Needs: Not on file  Physical Activity: Not on file  Stress: Not on file  Social Connections: Not on file    Outpatient Medications Prior to Visit  Medication Sig Dispense Refill   rosuvastatin (CRESTOR) 10 MG tablet Take 1 tablet (10 mg total) by mouth daily. 90 tablet 1   HYDROcodone-acetaminophen (NORCO/VICODIN) 5-325 MG tablet Take 0.5-1 tablets by mouth at bedtime. (Patient not taking: Reported on 06/17/2021) 30 tablet 0   lisinopril (ZESTRIL) 10 MG tablet TAKE 1 TABLET (10 MG TOTAL) BY MOUTH IN THE MORNING AND AT BEDTIME. 180 tablet 1   No facility-administered medications prior to visit.     EXAM:  BP 114/70 (BP Location: Left Arm, Patient Position: Sitting, Cuff Size: Normal)    Temp 98.5 F (36.9 C) (Oral)    Ht 5\' 3"  (1.6 m)    Wt 128 lb (58.1 kg)    SpO2 93%    BMI 22.67 kg/m   Body mass index is 22.67 kg/m. Wt Readings from Last 3 Encounters:  06/17/21 128 lb (58.1 kg)  04/12/21 120 lb (54.4 kg)  11/09/20 120 lb (54.4 kg)    Physical Exam: Vital signs reviewed [id number] is a well-developed well-nourished alert cooperative    who appearsr stated age in no acute distress.  HEENT: normocephalic atraumatic , Eyes: PERRL EOM's full, conjunctiva clear, Nares: paten,t no deformity discharge or tenderness., Ears: no deformity EAC's clear TMs with normal landmarks. Mouth: masked  NECK: supple without masses, thyromegaly or bruits. CHEST/PULM:  Clear to auscultation and percussion breath sounds equal no wheeze , rales or rhonchi. No chest  wall deformities or tenderness. Breast: normal by inspection . No dimpling, discharge, masses, tenderness or discharge . CV: PMI is nondisplaced, S1 S2 no gallops, murmurs, rubs. Peripheral pulses are full without delay.No JVD .  ABDOMEN: Bowel sounds normal nontender  No guard or rebound, no hepato splenomegal no CVA tenderness.   Extremtities:  No clubbing cyanosis or edema, no acute joint swelling or redness no focal atrophy NEURO:  Oriented x3, cranial nerves 3-12 appear to be intact, no obvious focal weakness,gait within normal limits no abnormal reflexes or asymmetrical SKIN: No acute rashes normal turgor, color, no bruising or petechiae. PSYCH: Oriented, good eye contact, no  obvious depression anxiety, cognition and judgment appear normal. LN: no cervical axillary inguinal adenopathy  Lab Results  Component Value Date   WBC 6.3 06/17/2021   HGB 14.3 06/17/2021   HCT 42.9 06/17/2021   PLT 293.0 06/17/2021   GLUCOSE 93 06/17/2021   CHOL 161 06/17/2021   TRIG 49.0 06/17/2021   HDL 75.10 06/17/2021   LDLCALC 76 06/17/2021   ALT 20 06/17/2021   AST 19 06/17/2021   NA 138 06/17/2021   K 3.9 06/17/2021   CL 101 06/17/2021   CREATININE 0.55 06/17/2021   BUN 14 06/17/2021   CO2 31 06/17/2021   TSH 1.08 06/17/2021   INR 1.0 04/17/2020   HGBA1C 5.4 06/17/2021    Dec 2021  MRI scan showed a 6 mm nonhemorrhagic right occipital lobe infarct in the right PCA distribution with underlying mild changes of small vessel disease.  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  MRA of the neck showed no significant extracranial stenosis.  2D echo showed normal ejection fraction and TEE showed no cardiac source of embolism, PFO or clot.  Transcranial Doppler bubble study was negative for right to left shunt.  Lower extremity venous Dopplers were negative for  BP Readings from Last 3 Encounters:  06/17/21 114/70  04/12/21 130/90  11/09/20 113/77    Lab plan reviewed  with patient   ASSESSMENT AND PLAN:  Discussed the following assessment and plan:    ICD-10-CM   1. Visit for preventive health examination  123456 Basic metabolic panel    CBC with Differential/Platelet    Hemoglobin A1c    Hepatic function panel    Lipid panel    TSH    TSH    Lipid panel    Hepatic function panel    Hemoglobin A1c    CBC with Differential/Platelet    Basic metabolic panel    2. Essential hypertension  99991111 Basic metabolic panel    CBC with Differential/Platelet    Hemoglobin A1c    Hepatic function panel    Lipid panel    TSH    TSH    Lipid panel    Hepatic function panel    Hemoglobin A1c    CBC with Differential/Platelet    Basic metabolic panel    3. Mixed hyperlipidemia  99991111 Basic metabolic panel    CBC with Differential/Platelet    Hemoglobin A1c    Hepatic function panel    Lipid panel    TSH    TSH    Lipid panel    Hepatic function panel    Hemoglobin A1c    CBC with Differential/Platelet    Basic metabolic panel    4. History of CVA (cerebrovascular accident) cryptogenic stroke   [id number] Basic metabolic panel    CBC with Differential/Platelet    Hemoglobin A1c    Hepatic function panel    Lipid panel    TSH    TSH    Lipid panel    Hepatic function panel    Hemoglobin A1c    CBC with Differential/Platelet    Basic metabolic panel    5. Medication management  123456 Basic metabolic panel    CBC with Differential/Platelet    Hemoglobin A1c    Hepatic function panel    Lipid panel    TSH    TSH    Lipid panel    Hepatic function panel    Hemoglobin A1c    CBC with Differential/Platelet  Basic metabolic panel    6. Estrogen deficiency  E28.39 DG Bone Density    Basic metabolic panel    CBC with Differential/Platelet    Hemoglobin A1c    Hepatic function panel    Lipid panel    TSH    TSH    Lipid panel    Hepatic function panel    Hemoglobin A1c    CBC with Differential/Platelet    Basic metabolic panel     7. Colon cancer screening declined  Z53.20     8. Mammogram declined  Z53.20     9. Cardiovascular risk factor  Z91.89     Declines screening  mammo and  colon.  Dexa ordered  she can make appt  Discussed lack of coverage for blood typing unless planning transfusion donating blood etc. she will wait on this. Discussed information that 1 can get from the coronary calcium scan may be self-pay even though she has had a CVA We can place order after she is on Medicare.  After March 1.  Return for depending on results 1 year .  Patient Care Team: Jannelly Bergren, Standley Brooking, MD as PCP - General Everlene Farrier, MD (Obstetrics and Gynecology) Patient Instructions  Good to see you today .  Lab today . Same meds   Will order coronary calcium ct for march and beyond.   Call for  appt for dexa scan   le [facility] [address] [place] [address] (Across from [facility] ) [phone] 8:30AM-5:15 pm  Yearly check or as indicated .     Standley Brooking. Geetika Laborde M.D.

## 2021-06-18 MED ORDER — LISINOPRIL 10 MG PO TABS: 10.0000 mg | ORAL_TABLET | Freq: Two times a day (BID) | ORAL | 3 refills | Status: DC

## 2021-06-18 MED ORDER — ROSUVASTATIN CALCIUM 10 MG PO TABS: 10.0000 mg | ORAL_TABLET | Freq: Every day | ORAL | 3 refills | Status: DC

## 2021-06-18 NOTE — Progress Notes (Signed)
Cholesterol much better, thyroid kidney function liver all normal.  No anemia. Continue medication I will place a future order for coronary scan to be done March or later.

## 2021-06-19 NOTE — Progress Notes (Signed)
Carelink Summary Report / Loop Recorder 

## 2021-07-04 ENCOUNTER — Telehealth: Payer: Self-pay | Admitting: Cardiology

## 2021-07-04 NOTE — Telephone Encounter (Signed)
° °  Pt would like to r/s her device remote transmission because she is in the middle of changing insurance and wanted make sure it is covered

## 2021-07-05 NOTE — Telephone Encounter (Signed)
I spoke with the patient and she states she got everything worked out with [employer]. She would like to keep her remotes as is. The patient thanked me for the call.

## 2021-07-10 ENCOUNTER — Encounter: Payer: Self-pay | Admitting: Internal Medicine

## 2021-07-10 DIAGNOSIS — E782 Mixed hyperlipidemia: Secondary | ICD-10-CM

## 2021-07-10 DIAGNOSIS — Z9189 Other specified personal risk factors, not elsewhere classified: Secondary | ICD-10-CM

## 2021-07-10 DIAGNOSIS — Z8673 Personal history of transient ischemic attack (TIA), and cerebral infarction without residual deficits: Secondary | ICD-10-CM

## 2021-07-11 NOTE — Telephone Encounter (Signed)
Ct cardiac scan DRI ordered ?

## 2021-07-11 NOTE — Telephone Encounter (Signed)
Dont know if it will be covered  We dont make these decisions but we can  order and see what happens .  Vicki Stevens please order the ct cardiac scan  dri ( not self pay ) with same dx   hx of cva , cv risk and HLD    thanks

## 2021-07-15 ENCOUNTER — Ambulatory Visit (INDEPENDENT_AMBULATORY_CARE_PROVIDER_SITE_OTHER): Payer: Medicare HMO

## 2021-07-15 DIAGNOSIS — I6300 Cerebral infarction due to thrombosis of unspecified precerebral artery: Secondary | ICD-10-CM

## 2021-07-16 ENCOUNTER — Encounter: Payer: Self-pay | Admitting: Internal Medicine

## 2021-07-16 LAB — CUP PACEART REMOTE DEVICE CHECK
Date Time Interrogation Session: 20230306230942
Implantable Pulse Generator Implant Date: 20211209

## 2021-07-17 NOTE — Telephone Encounter (Signed)
Pt notified that billing issue has been reviewed & her insurance will be refiled. Pt verb understanding. ?

## 2021-07-17 NOTE — Telephone Encounter (Signed)
Pt is calling back and would like explanation of why she was billed for cpe and office visit ?

## 2021-07-23 ENCOUNTER — Encounter (INDEPENDENT_AMBULATORY_CARE_PROVIDER_SITE_OTHER): Payer: Medicare HMO | Admitting: Ophthalmology

## 2021-07-23 ENCOUNTER — Other Ambulatory Visit: Payer: Self-pay

## 2021-07-23 DIAGNOSIS — I10 Essential (primary) hypertension: Secondary | ICD-10-CM

## 2021-07-23 DIAGNOSIS — H35033 Hypertensive retinopathy, bilateral: Secondary | ICD-10-CM | POA: Diagnosis not present

## 2021-07-23 DIAGNOSIS — H33301 Unspecified retinal break, right eye: Secondary | ICD-10-CM | POA: Diagnosis not present

## 2021-07-23 DIAGNOSIS — I679 Cerebrovascular disease, unspecified: Secondary | ICD-10-CM | POA: Diagnosis not present

## 2021-07-23 DIAGNOSIS — H2513 Age-related nuclear cataract, bilateral: Secondary | ICD-10-CM

## 2021-07-23 DIAGNOSIS — H43813 Vitreous degeneration, bilateral: Secondary | ICD-10-CM | POA: Diagnosis not present

## 2021-07-25 NOTE — Progress Notes (Signed)
Carelink Summary Report / Loop Recorder 

## 2021-08-12 ENCOUNTER — Encounter: Payer: PRIVATE HEALTH INSURANCE | Admitting: Internal Medicine

## 2021-08-14 ENCOUNTER — Encounter (INDEPENDENT_AMBULATORY_CARE_PROVIDER_SITE_OTHER): Payer: Medicare HMO | Admitting: Ophthalmology

## 2021-08-14 ENCOUNTER — Telehealth: Payer: Self-pay

## 2021-08-14 NOTE — Telephone Encounter (Signed)
The patient called back and asked me to cancel her 08-15-2021 remote appointment. I told her I did send Dr. Lalla Brothers a message.  ? ?She also wants to know if she should keep the loop in just in case something do happen or should she have it explanted? She is good with having it explanted but wants Dr. Lalla Brothers recommendation. I told her I will let him know and when he does I will give her a call back. ?

## 2021-08-14 NOTE — Telephone Encounter (Signed)
The patient left a message stating she has had the loop for 15 months and never had an event on the loop. She wants to stop being monitored. Her phone number is [phone]. ? ?LMOVM returning patient call. I will let Dr. Lalla Brothers know. ?

## 2021-08-14 NOTE — Telephone Encounter (Signed)
Patient called in today in regards to below mentioned issue. Patient stating that billing is currently incorrect...  ?Patient would like return call.  ?

## 2021-08-19 NOTE — Telephone Encounter (Signed)
OK to stop monitoring. Would recommend keeping loop in place while battery intact so that we could interrogate in case of palpitations, etc.  ?Vicki Stevens, can you call her to discuss?  ?Would recommend follow up with APP.  ?Thx,  ?Steffanie Dunn  ? ?The patient wishes to think about if she wants to have the device taken out. States she is leaning that way and doesn't wish to make an appointment at this time with the APP because they don't remove it. She will call or mychart back in if/when she wants to have it taken out, which is most likely the route she will take. Will route to MD as FYI.  ?

## 2021-08-28 ENCOUNTER — Encounter (INDEPENDENT_AMBULATORY_CARE_PROVIDER_SITE_OTHER): Payer: Medicare HMO | Admitting: Ophthalmology

## 2021-09-02 ENCOUNTER — Ambulatory Visit (INDEPENDENT_AMBULATORY_CARE_PROVIDER_SITE_OTHER)
Admission: RE | Admit: 2021-09-02 | Discharge: 2021-09-02 | Disposition: A | Discharge status: Home / Self Care | Payer: Self-pay | Source: Ambulatory Visit | Attending: Internal Medicine | Admitting: Internal Medicine

## 2021-09-02 DIAGNOSIS — Z9189 Other specified personal risk factors, not elsewhere classified: Secondary | ICD-10-CM

## 2021-09-02 DIAGNOSIS — Z8673 Personal history of transient ischemic attack (TIA), and cerebral infarction without residual deficits: Secondary | ICD-10-CM

## 2021-09-02 DIAGNOSIS — E782 Mixed hyperlipidemia: Secondary | ICD-10-CM

## 2021-09-02 IMAGING — CT CT CARDIAC CORONARY ARTERY CALCIUM SCORE
3 series · 14 of 20 positions shown, 16 images · non-contrast
Comparison: None.
COMPARISON: None.

Addendum:
EXAM:
OVER-READ INTERPRETATION  CT CHEST

The following report is an over-read performed by radiologist Dr.
TOTH [REDACTED] on [DATE]. This
over-read does not include interpretation of cardiac or coronary
anatomy or pathology. The coronary calcium score interpretation by
the cardiologist is attached.
CLINICAL DATA: Cardiovascular Disease Risk stratification
Coronary Calcium Score
TECHNIQUE: A gated, non-contrast computed tomography scan of the heart was
performed using 3mm slice thickness. Axial images were analyzed on a
dedicated workstation. Calcium scoring of the coronary arteries was
performed using the Agatston method.

[Series 2: cascseq 2.0 sa36 70% (id) · axial · 0.39mm/px · z∈[-206,-116]mm · 4 of 76 slices shown]
[im 16/76  vessel]
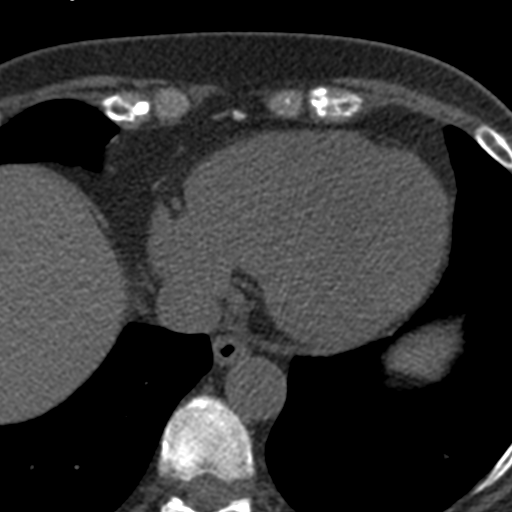
[im 31/76  vessel]
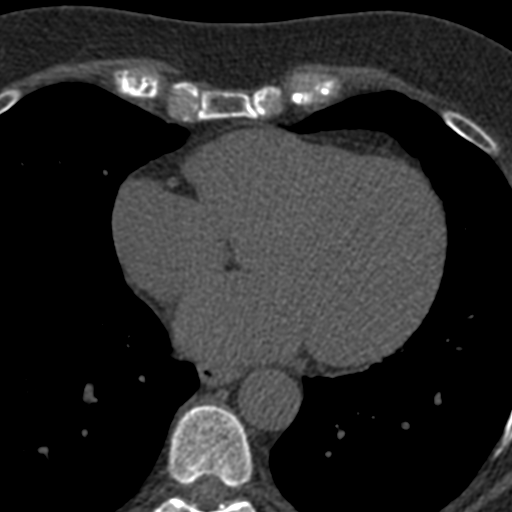
[im 46/76  vessel]
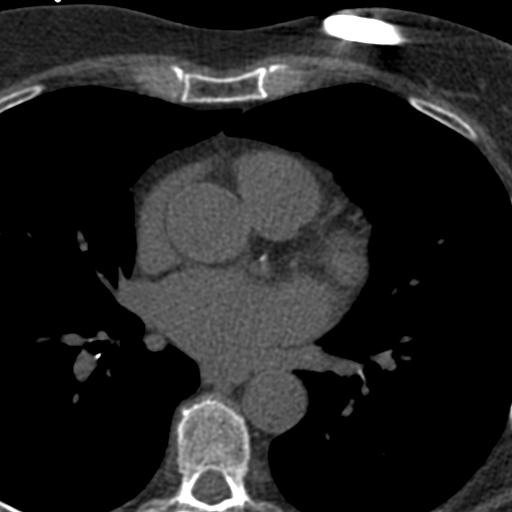
[im 61/76  vessel]
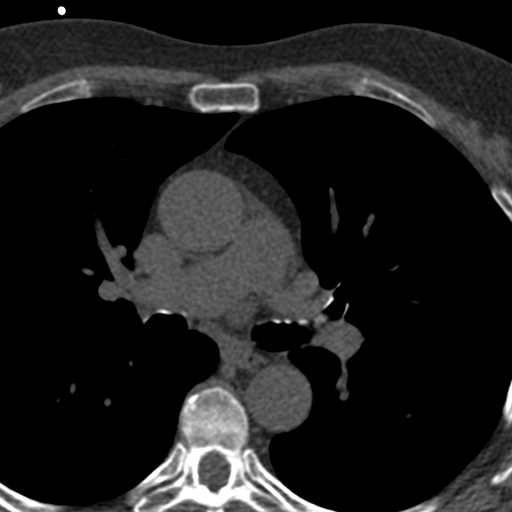

[Series 3: cascseq 2.0 bf37 st · axial · 0.67mm/px · z∈[-212,-112]mm · 5 of 76 slices shown, 7 images]
[im 13/76  vessel]
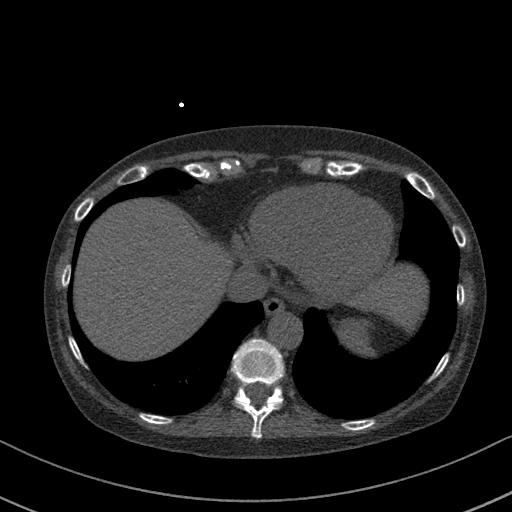
[im 13/76  lung]
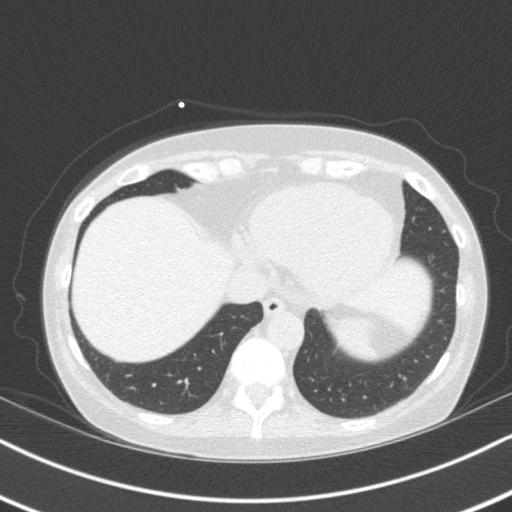
[im 26/76  vessel]
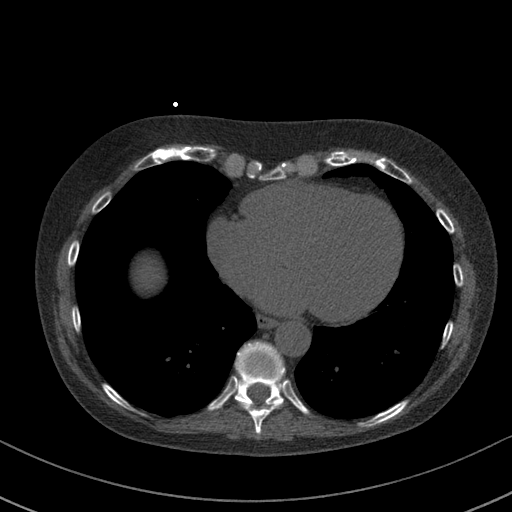
[im 38/76  vessel]
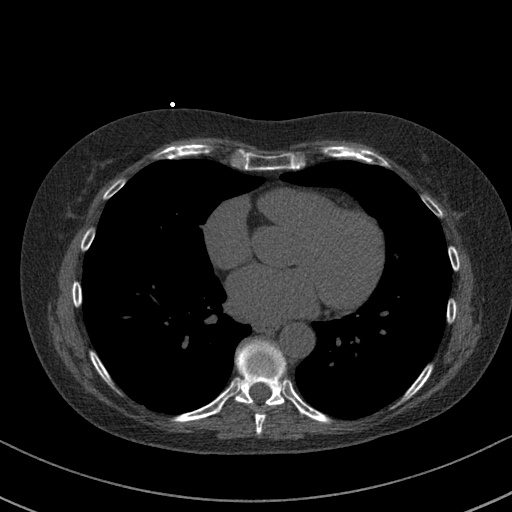
[im 51/76  vessel]
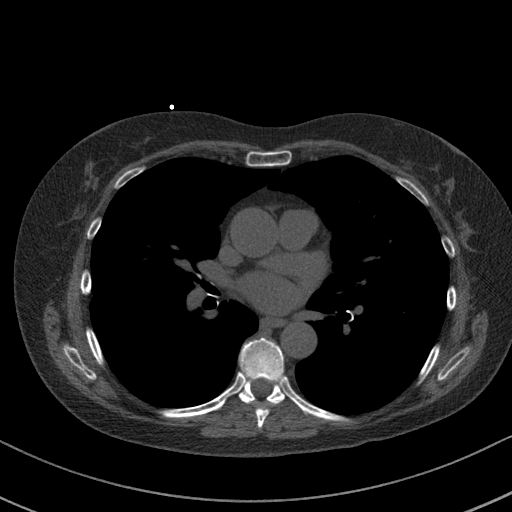
[im 63/76  vessel]
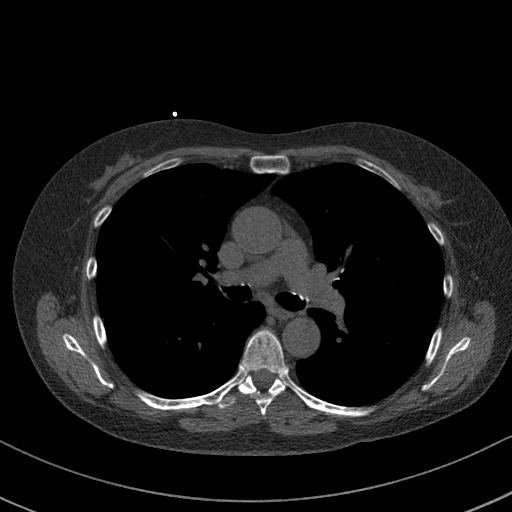
[im 63/76  lung]
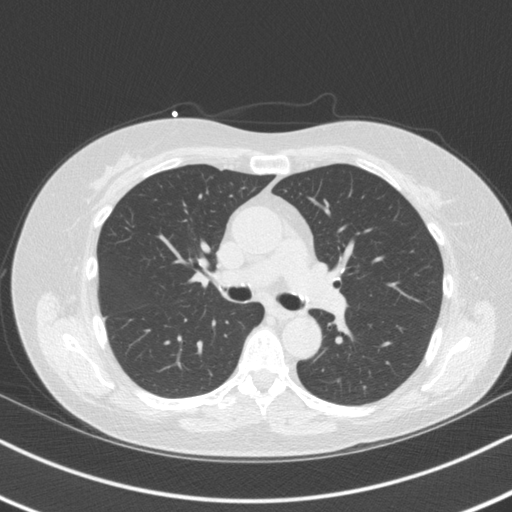

[Series 4: cascseq 2.0 br59 lung · axial · 0.67mm/px · z∈[-212,-112]mm · 5 of 76 slices shown]
[im 13/76  lung]
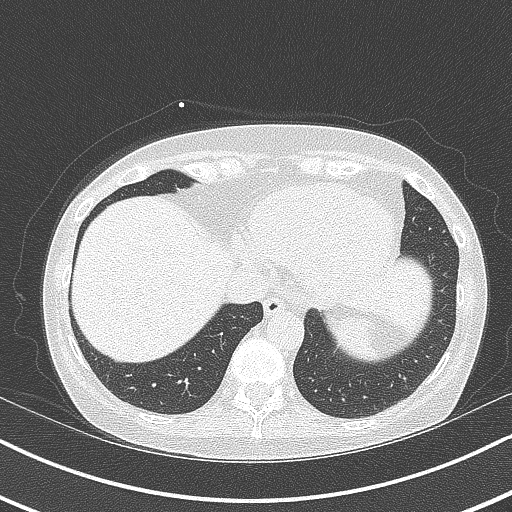
[im 26/76  lung]
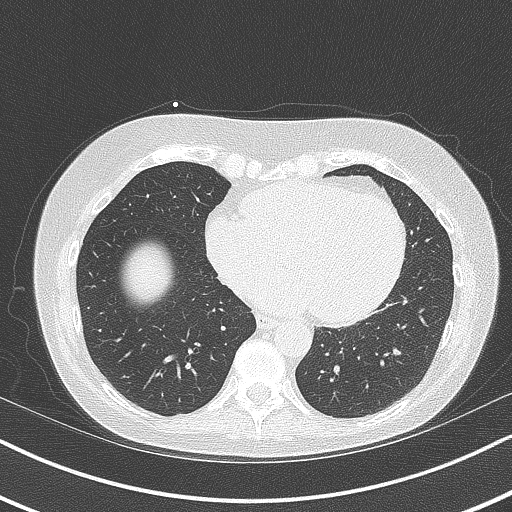
[im 38/76  lung]
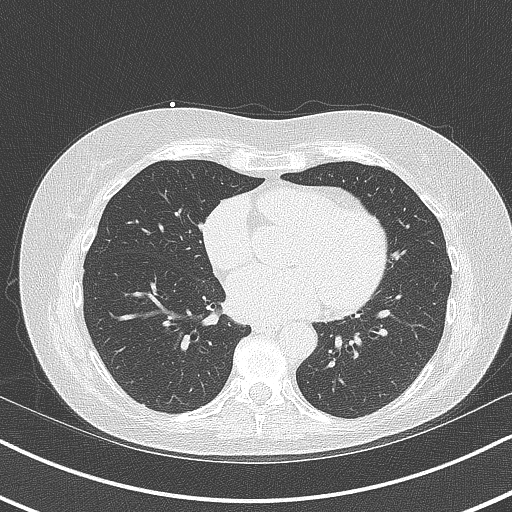
[im 51/76  lung]
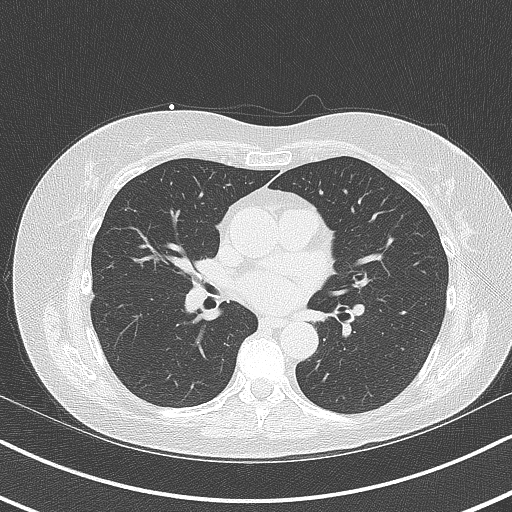
[im 63/76  lung]
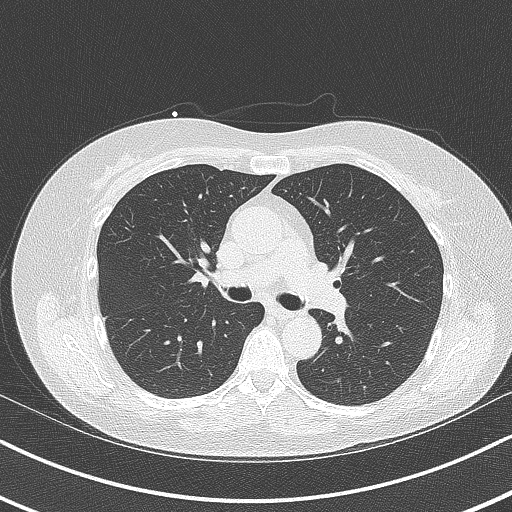

[14 of 20 positions shown; findings below may reference images not displayed]

FINDINGS: Vascular: Normal heart size. No pericardial effusion. Normal caliber
thoracic aorta with minimal calcified plaque.

Mediastinum/Nodes: Esophagus is unremarkable. No pathologically
enlarged lymph nodes seen in the chest.

Lungs/Pleura: Central airways are patent. No consolidation, pleural
effusion or pneumothorax.

Upper Abdomen: No acute abnormality.

Musculoskeletal: Loop recorder device.
IMPRESSION: No acute extracardiac findings.
FINDINGS: Coronary arteries: Normal origins.

Coronary Calcium Score:

Left main: 4

Left anterior descending artery: 29

Left circumflex artery:

Right coronary artery:

Total: 33

Percentile: 69

Pericardium: Normal.

Ascending Aorta: Normal caliber.

Non-cardiac: See separate report from [REDACTED].
IMPRESSION: Coronary calcium score of 33. This was 69th percentile for age-,
race-, and sex-matched controls.



If CAC=0, it is reasonable to withhold statin therapy and reassess
in 5 to 10 years, as long as higher risk conditions are absent
(diabetes mellitus, family history of premature CHD in first degree
relatives (males <55 years; females <65 years), cigarette smoking,
or LDL >=190 mg/dL).

If CAC is 1 to 99, it is reasonable to initiate statin therapy for
patients >=55 years of age.

If CAC is >=100 or >=75th percentile, it is reasonable to initiate
statin therapy at any age.

Cardiology referral should be considered for patients with CAC
scores >=400 or >=75th percentile.

*[7M] AHA/ACC/AACVPR/AAPA/ABC/TOTH/TOTH/TOTH/TOTH/TOTH/TOTH/TOTH
Guideline on the Management of Blood Cholesterol: A Report of the
American College of Cardiology/American Heart Association Task Force
on Clinical Practice Guidelines. J Am Coll Cardiol.
[7M];73(24):[PHONE_NUMBER].

*** End of Addendum ***
EXAM:
OVER-READ INTERPRETATION  CT CHEST

The following report is an over-read performed by radiologist Dr.
TOTH [REDACTED] on [DATE]. This
over-read does not include interpretation of cardiac or coronary
anatomy or pathology. The coronary calcium score interpretation by
the cardiologist is attached.
FINDINGS: Vascular: Normal heart size. No pericardial effusion. Normal caliber
thoracic aorta with minimal calcified plaque.

Mediastinum/Nodes: Esophagus is unremarkable. No pathologically
enlarged lymph nodes seen in the chest.

Lungs/Pleura: Central airways are patent. No consolidation, pleural
effusion or pneumothorax.

Upper Abdomen: No acute abnormality.

Musculoskeletal: Loop recorder device.
IMPRESSION: No acute extracardiac findings.

## 2021-09-12 NOTE — Progress Notes (Signed)
So calcium score  shows  66 %ile  which with  your hx  would advise  statin medication more likely benefit than risk  non cardiac imaging is normal.  Could consider  even taking 20 mg rosuvastatin  to get more benefit if not adverse side effects

## 2022-02-10 ENCOUNTER — Telehealth: Payer: Self-pay

## 2022-02-10 NOTE — Telephone Encounter (Signed)
The patient asked if anything been on her loop since April 2023? I had Leigh, rn look at the transmissions and she states everything looks good. Nothing on the transmission. The patient thanked me for my help.

## 2022-04-20 NOTE — Progress Notes (Unsigned)
Electrophysiology Office Follow up Visit Note:    Date:  04/20/2022   ID:  Vicki Stevens, DOB 1955-07-24, MRN [id number]  PCP:  Madelin Headings, MD  [facility] Cardiologist:  None  [facility] Electrophysiologist:  None    Interval History:    Vicki Stevens is a 66 y.o. female who presents for a follow up visit. 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 She presents today to have the loop recorder explanted.  Her ILR has NOT detected AF during its use.      Past Medical History:  Diagnosis Date   COVID-19 2021   H/O hematuria remote   dr Vonita Moss felt to be from running    Heart murmur    evaluation ? mvp when younger   Hx of varicella    Hypertension    LOW BACK PAIN, ACUTE 03/05/2010   Stroke (HCC) 04/17/2020    Past Surgical History:  Procedure Laterality Date   BUBBLE STUDY  04/19/2020   Procedure: BUBBLE STUDY;  Surgeon: Parke Poisson, MD;  Location: [facility] ENDOSCOPY;  Service: Cardiology;;   LOOP RECORDER INSERTION N/A 04/19/2020   Procedure: LOOP RECORDER INSERTION;  Surgeon: Lanier Prude, MD;  Location: [facility] INVASIVE CV LAB;  Service: Cardiovascular;  Laterality: N/A;   TEE WITHOUT CARDIOVERSION N/A 04/19/2020   Procedure: TRANSESOPHAGEAL ECHOCARDIOGRAM (TEE);  Surgeon: Parke Poisson, MD;  Location: [facility] ENDOSCOPY;  Service: Cardiology;  Laterality: N/A;    Current Medications: No outpatient medications have been marked as taking for the 04/21/22 encounter (Appointment) with Lanier Prude, MD.     Allergies:   Patient has no known allergies.   Social History   Socioeconomic History   Marital status: Married    Spouse name: Reino Kent   Number of children: Not on file   Years of education: Not on file   Highest education level: Not on file  Occupational History   Occupation: part time/self employed  Tobacco Use   Smoking status: Never   Smokeless tobacco: Never  Vaping Use   Vaping Use: Never  used  Substance and Sexual Activity   Alcohol use: Yes    Comment: occasonally   Drug use: Never   Sexual activity: Not on file  Other Topics Concern   Not on file  Social History Narrative   Lives with Husband   caffeine 2-3 in am ocass    Right handed   Social Determinants of Health   Financial Resource Strain: Not on file  Food Insecurity: Not on file  Transportation Needs: Not on file  Physical Activity: Not on file  Stress: Not on file  Social Connections: Not on file     Family History: The patient's family history includes Lung cancer in her father; Other in her mother; Stroke in her father.  ROS:   Please see the history of present illness.    All other systems reviewed and are negative.  EKGs/Labs/Other Studies Reviewed:    The following studies were reviewed today:   Recent Labs: 06/17/2021: ALT 20; BUN 14; Creatinine, Ser 0.55; Hemoglobin 14.3; Platelets 293.0; Potassium 3.9; Sodium 138; TSH 1.08  Recent Lipid Panel    Component Value Date/Time   CHOL 161 06/17/2021 1047   TRIG 49.0 06/17/2021 1047   HDL 75.10 06/17/2021 1047   CHOLHDL 2 06/17/2021 1047   VLDL 9.8 06/17/2021 1047   LDLCALC 76 06/17/2021 1047    Physical Exam:  VS:  There were no vitals taken for this visit.    Wt Readings from Last 3 Encounters:  06/17/21 128 lb (58.1 kg)  04/12/21 120 lb (54.4 kg)  11/09/20 120 lb (54.4 kg)     GEN:  Well nourished, well developed in no acute distress HEENT: Normal NECK: No JVD; No carotid bruits LYMPHATICS: No lymphadenopathy CARDIAC: ***RRR, no murmurs, rubs, gallops RESPIRATORY:  Clear to auscultation without rales, wheezing or rhonchi  ABDOMEN: Soft, non-tender, non-distended MUSCULOSKELETAL:  No edema; No deformity  SKIN: Warm and dry NEUROLOGIC:  Alert and oriented x 3 PSYCHIATRIC:  Normal affect        ASSESSMENT:    No diagnosis found. PLAN:    In order of problems listed above:   #Cryptogenic stroke No AF detected  thus far on ILR.  She would like her ILR removed. Discussed procedure in detail including the risks and she wishes to proceed.        Total time spent with patient today *** minutes. This includes reviewing records, evaluating the patient and coordinating care.   Medication Adjustments/Labs and Tests Ordered: Current medicines are reviewed at length with the patient today.  Concerns regarding medicines are outlined above.  No orders of the defined types were placed in this encounter.  No orders of the defined types were placed in this encounter.    Signed, Steffanie Dunn, MD, [facility], [facility] 04/20/2022 7:41 PM    Electrophysiology  [facility]  ------------------------  SURGEON:  Steffanie Dunn, MD    PREPROCEDURE DIAGNOSIS:  cryptogenic stroke    POSTPROCEDURE DIAGNOSIS:  cryptogenic stroke     PROCEDURES:   1. Implantable loop recorder explantation    INTRODUCTION:  Vicki Stevens is a 66 y.o. female with a history of cryptogenic stroke who presents today for implantable loop explantation.      DESCRIPTION OF PROCEDURE:  Informed written consent was obtained.  The patient required no sedation for the procedure today.   The patients left chest was therefore prepped and draped in the usual sterile fashion.  The skin overlying the ILR monitor was infiltrated with lidocaine for local analgesia.  A 0.5-cm incision was made over the site.  The previously implanted ILR was exposed and removed using a combination of sharp and blunt dissection.  Steri- Strips and a sterile dressing were then applied. EBL<88ml.  There were no early apparent complications.     CONCLUSIONS:   1. Successful explantation of an implantable loop recorder   2. No early apparent complications.        Steffanie Dunn, MD 04/20/2022 7:45 PM

## 2022-04-21 ENCOUNTER — Encounter: Payer: Self-pay | Admitting: Cardiology

## 2022-04-21 ENCOUNTER — Ambulatory Visit: Payer: Medicare HMO | Attending: Cardiology | Admitting: Cardiology

## 2022-04-21 VITALS — BP 168/64 | HR 62 | Ht 63.0 in | Wt 127.2 lb

## 2022-04-21 DIAGNOSIS — I639 Cerebral infarction, unspecified: Secondary | ICD-10-CM

## 2022-04-21 NOTE — Patient Instructions (Signed)
Medication Instructions:  Your physician recommends that you continue on your current medications as directed. Please refer to the Current Medication list given to you today.  Labwork: None ordered.  Testing/Procedures: None ordered.  Follow-Up:  Your physician wants you to follow-up in: one year with Dr. Lalla Brothers. You will receive a reminder letter in the mail two months in advance. If you don't receive a letter, please call our office to schedule the follow-up appointment.    Implantable Loop Recorder Removal, Care After This sheet gives you information about how to care for yourself after your procedure. Your health care provider may also give you more specific instructions. If you have problems or questions, contact your health care provider. What can I expect after the procedure? After the procedure, it is common to have: Soreness or discomfort near the incision. Some swelling or bruising near the incision.  Follow these instructions at home: Incision care  Monitor your cardiac device site for redness, swelling, and drainage. Call the device clinic at [phone] if you experience these symptoms or fever/chills.  Keep the large square bandage on your site for 24 hours and then you may remove it yourself. Keep the steri-strips underneath in place.   You may shower after 72 hours / 3 days from your procedure with the steri-strips in place. They will usually fall off on their own, or may be removed after 10 days. Pat dry.   Avoid lotions, ointments, or perfumes over your incision until it is well-healed.  Please do not submerge in water until your site is completely healed.   If your wound site starts to bleed apply pressure.       If you have any questions/concerns please call the device clinic at [phone].  Activity  Return to your normal activities.  Contact a health care provider if: You have redness, swelling, or pain around your incision. You have a  fever.

## 2022-04-21 NOTE — Progress Notes (Signed)
Electrophysiology Office Follow up Visit Note:    Date:  04/21/2022   ID:  Vicki Stevens, DOB 1955-09-06, MRN [id number]  PCP:  Burnis Medin, MD  [facility] Cardiologist:  None  [facility] Electrophysiologist:  Vickie Epley, MD    Interval History:    Vicki Stevens is a 66 y.o. female who presents for a follow up visit. 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 She presents today to have the loop recorder explanted.  Her ILR has NOT detected AF during its use.  Today, she states she is feeling great. For 2 years her ILR has not detected any arrhythmias. Lately she has not had any pain attributable to the device. However, she is able to feel where it is located.   She monitors her blood pressure daily. This morning at home her BP was 129/74. In clinic her BP is 168/64.  Typically she is active with running for exercise.  She denies any palpitations, chest pain, shortness of breath, or peripheral edema. No lightheadedness, headaches, syncope, orthopnea, or PND.      Past Medical History:  Diagnosis Date   COVID-19 2021   H/O hematuria remote   dr Terance Hart felt to be from running    Heart murmur    evaluation ? mvp when younger   Hx of varicella    Hypertension    LOW BACK PAIN, ACUTE 03/05/2010   Stroke ([place]) 04/17/2020    Past Surgical History:  Procedure Laterality Date   BUBBLE STUDY  04/19/2020   Procedure: BUBBLE STUDY;  Surgeon: Elouise Munroe, MD;  Location: [facility] ENDOSCOPY;  Service: Cardiology;;   LOOP RECORDER INSERTION N/A 04/19/2020   Procedure: LOOP RECORDER INSERTION;  Surgeon: Vickie Epley, MD;  Location: [place] CV LAB;  Service: Cardiovascular;  Laterality: N/A;   TEE WITHOUT CARDIOVERSION N/A 04/19/2020   Procedure: TRANSESOPHAGEAL ECHOCARDIOGRAM (TEE);  Surgeon: Elouise Munroe, MD;  Location: [place];  Service: Cardiology;  Laterality: N/A;    Current Medications: Current Meds   Medication Sig   lisinopril (ZESTRIL) 10 MG tablet Take 1 tablet (10 mg total) by mouth in the morning and at bedtime.     Allergies:   Patient has no known allergies.   Social History   Socioeconomic History   Marital status: Married    Spouse name: Danella Deis   Number of children: Not on file   Years of education: Not on file   Highest education level: Not on file  Occupational History   Occupation: part time/self employed  Tobacco Use   Smoking status: Never   Smokeless tobacco: Never  Vaping Use   Vaping Use: Never used  Substance and Sexual Activity   Alcohol use: Yes    Comment: occasonally   Drug use: Never   Sexual activity: Not on file  Other Topics Concern   Not on file  Social History Narrative   Lives with Husband   caffeine 2-3 in am ocass    Right handed   Social Determinants of Health   Financial Resource Strain: Not on file  Food Insecurity: Not on file  Transportation Needs: Not on file  Physical Activity: Not on file  Stress: Not on file  Social Connections: Not on file     Family History: The patient's family history includes Lung cancer in her father; Other in her mother; Stroke in her father.  ROS:   Please see the history of  present illness.    All other systems reviewed and are negative.  EKGs/Labs/Other Studies Reviewed:    The following studies were reviewed today:   Recent Labs: 06/17/2021: ALT 20; BUN 14; Creatinine, Ser 0.55; Hemoglobin 14.3; Platelets 293.0; Potassium 3.9; Sodium 138; TSH 1.08   Recent Lipid Panel    Component Value Date/Time   CHOL 161 06/17/2021 1047   TRIG 49.0 06/17/2021 1047   HDL 75.10 06/17/2021 1047   CHOLHDL 2 06/17/2021 1047   VLDL 9.8 06/17/2021 1047   LDLCALC 76 06/17/2021 1047    Physical Exam:    VS:  BP (!) 168/64   Pulse 62   Ht 5\' 3"  (1.6 m)   Wt 127 lb 3.2 oz (57.7 kg)   SpO2 99%   BMI 22.53 kg/m     Wt Readings from Last 3 Encounters:  04/21/22 127 lb 3.2 oz (57.7 kg)   06/17/21 128 lb (58.1 kg)  04/12/21 120 lb (54.4 kg)     GEN:  Well nourished, well developed in no acute distress HEENT: Normal NECK: No JVD; No carotid bruits LYMPHATICS: No lymphadenopathy CARDIAC: RRR, no murmurs, rubs, gallops RESPIRATORY:  Clear to auscultation without rales, wheezing or rhonchi  ABDOMEN: Soft, non-tender, non-distended MUSCULOSKELETAL:  No edema; No deformity  SKIN: Warm and dry NEUROLOGIC:  Alert and oriented x 3 PSYCHIATRIC:  Normal affect        ASSESSMENT:    1. Cryptogenic stroke (HCC)    PLAN:    In order of problems listed above:   #Cryptogenic stroke No AF detected thus far on ILR.  She would like her ILR removed. Discussed procedure in detail including the risks and she wishes to proceed.      Medication Adjustments/Labs and Tests Ordered: Current medicines are reviewed at length with the patient today.  Concerns regarding medicines are outlined above.   No orders of the defined types were placed in this encounter.  No orders of the defined types were placed in this encounter.  I,Mathew Stumpf,acting as a 14/02/22 for Neurosurgeon, MD.,have documented all relevant documentation on the behalf of Lanier Prude, MD,as directed by  Lanier Prude, MD while in the presence of Lanier Prude, MD.  I, Lanier Prude, MD, have reviewed all documentation for this visit. The documentation on 04/21/22 for the exam, diagnosis, procedures, and orders are all accurate and complete.   Signed, 14/11/23, MD, [facility], [facility] 04/21/2022 12:58 PM    Electrophysiology [facility]  ------------------------  SURGEON:  14/03/2022, MD    PREPROCEDURE DIAGNOSIS:  cryptogenic stroke    POSTPROCEDURE DIAGNOSIS:  cryptogenic stroke     PROCEDURES:   1. Implantable loop recorder explantation    INTRODUCTION:  Vicki Stevens is a 66 y.o. female with a history of cryptogenic stroke who presents today  for implantable loop explantation.      DESCRIPTION OF PROCEDURE:  Informed written consent was obtained.  The patient required no sedation for the procedure today.   The patients left chest was therefore prepped and draped in the usual sterile fashion.  The skin overlying the ILR monitor was infiltrated with lidocaine for local analgesia.  A 0.5-cm incision was made over the site.  The previously implanted ILR was exposed and removed using a combination of sharp and blunt dissection.  Steri- Strips and a sterile dressing were then applied. EBL<6ml.  There were no early apparent complications.     CONCLUSIONS:  1. Successful explantation of an implantable loop recorder   2. No early apparent complications.        Lars Mage, MD 04/21/2022 12:58 PM

## 2022-06-09 ENCOUNTER — Other Ambulatory Visit: Payer: Self-pay | Admitting: Internal Medicine

## 2022-06-09 MED ORDER — LISINOPRIL 10 MG PO TABS: 10.0000 mg | ORAL_TABLET | Freq: Two times a day (BID) | ORAL | 1 refills | Status: DC

## 2022-07-14 NOTE — Progress Notes (Unsigned)
No chief complaint on file.   HPI: Patient  Vicki Stevens  67 y.o. comes in today for Preventive Health Care visit   Health Maintenance  Topic Date Due   Medicare Annual Wellness (AWV)  Never done   Hepatitis C Screening  Never done   Zoster Vaccines- Shingrix (1 of 2) Never done   COLONOSCOPY (Pts 45-28yr Insurance coverage will need to be confirmed)  Never done   MAMMOGRAM  Never done   DTaP/Tdap/Td (2 - Tdap) 05/12/2016   PAP SMEAR-Modifier  07/10/2016   COVID-19 Vaccine (2 - Janssen risk series) 09/19/2019   Pneumonia Vaccine 66 Years old (1 of 1 - PCV) Never done   DEXA SCAN  Never done   INFLUENZA VACCINE  Never done   HIV Screening  Completed   HPV VACCINES  Aged Out   Health Maintenance Review LIFESTYLE:  Exercise:   Tobacco/ETS: Alcohol:  Sugar beverages: Sleep: Drug use: no HH of  Work:    ROS:  GEN/ HEENT: No fever, significant weight changes sweats headaches vision problems hearing changes, CV/ PULM; No chest pain shortness of breath cough, syncope,edema  change in exercise tolerance. GI /GU: No adominal pain, vomiting, change in bowel habits. No blood in the stool. No significant GU symptoms. SKIN/HEME: ,no acute skin rashes suspicious lesions or bleeding. No lymphadenopathy, nodules, masses.  NEURO/ PSYCH:  No neurologic signs such as weakness numbness. No depression anxiety. IMM/ Allergy: No unusual infections.  Allergy .   REST of 12 system review negative except as per HPI   Past Medical History:  Diagnosis Date   COVID-19 2021   H/O hematuria remote   dr PTerance Hartfelt to be from running    Heart murmur    evaluation ? mvp when younger   Hx of varicella    Hypertension    LOW BACK PAIN, ACUTE 03/05/2010   [place] 04/[place]    Past Surgical History:  Procedure Laterality Date   BUBBLE STUDY  04/19/2020   Procedure: BUBBLE STUDY;  Surgeon: AElouise Munroe MD;  [facility]  Service: Cardiology;;   LOOP RECORDER  INSERTION N/A 04/19/2020   Procedure: LOOP RECORDER INSERTION;  Surgeon: LVickie Epley MD;  [place];  Service: Cardiovascular;  Laterality: N/A;   TEE WITHOUT CARDIOVERSION N/A 04/19/2020   Procedure: TRANSESOPHAGEAL ECHOCARDIOGRAM (TEE);  Surgeon: AElouise Munroe MD;  [place]  Service: Cardiology;  Laterality: N/A;    Family History  Problem Relation Age of Onset   Other Mother        MVA 2100   Stroke Father    Lung cancer Father        349   Social History   Socioeconomic History   Marital status: Married    Spouse name: [name]  Number of children: Not on file   Years of education: Not on file   Highest education level: Not on file  Occupational History   Occupation: part time/self employed  Tobacco Use   Smoking status: Never   Smokeless tobacco: Never  Vaping Use   Vaping Use: Never used  Substance and Sexual Activity   Alcohol use: Yes    Comment: occasonally   Drug use: Never   Sexual activity: Not on file  Other Topics Concern   Not on file  Social History Narrative   Lives with Husband   caffeine 2-3 in am ocass    Right handed  Social Determinants of Health   Financial Resource Strain: Not on file  Food Insecurity: Not on file  Transportation Needs: Not on file  Physical Activity: Not on file  Stress: Not on file  Social Connections: Not on file    Outpatient Medications Prior to Visit  Medication Sig Dispense Refill   lisinopril (ZESTRIL) 10 MG tablet Take 1 tablet (10 mg total) by mouth in the morning and at bedtime. 180 tablet 1   No facility-administered medications prior to visit.     EXAM:  There were no vitals taken for this visit.  There is no height or weight on file to calculate BMI. Wt Readings from Last 3 Encounters:  04/21/22 127 lb 3.2 oz (57.7 kg)  06/17/21 128 lb (58.1 kg)  04/12/21 120 lb (54.4 kg)    Physical Exam: Vital signs reviewed [id number] is a well-developed  well-nourished alert cooperative    who appearsr stated age in no acute distress.  HEENT: normocephalic atraumatic , Eyes: PERRL EOM's full, conjunctiva clear, Nares: paten,t no deformity discharge or tenderness., Ears: no deformity EAC's clear TMs with normal landmarks. Mouth: clear OP, no lesions, edema.  Moist mucous membranes. Dentition in adequate repair. NECK: supple without masses, thyromegaly or bruits. CHEST/PULM:  Clear to auscultation and percussion breath sounds equal no wheeze , rales or rhonchi. No chest wall deformities or tenderness. Breast: normal by inspection . No dimpling, discharge, masses, tenderness or discharge . CV: PMI is nondisplaced, S1 S2 no gallops, murmurs, rubs. Peripheral pulses are full without delay.No JVD .  ABDOMEN: Bowel sounds normal nontender  No guard or rebound, no hepato splenomegal no CVA tenderness.  Extremtities:  No clubbing cyanosis or edema, no acute joint swelling or redness no focal atrophy NEURO:  Oriented x3, cranial nerves 3-12 appear to be intact, no obvious focal weakness,gait within normal limits no abnormal reflexes or asymmetrical SKIN: No acute rashes normal turgor, color, no bruising or petechiae. PSYCH: Oriented, good eye contact, no obvious depression anxiety, cognition and judgment appear normal. LN: no cervical axillary inguinal adenopathy  Lab Results  Component Value Date   WBC 6.3 06/17/2021   HGB 14.3 06/17/2021   HCT 42.9 06/17/2021   PLT 293.0 06/17/2021   GLUCOSE 93 06/17/2021   CHOL 161 06/17/2021   TRIG 49.0 06/17/2021   HDL 75.10 06/17/2021   LDLCALC 76 06/17/2021   ALT 20 06/17/2021   AST 19 06/17/2021   NA 138 06/17/2021   K 3.9 06/17/2021   CL 101 06/17/2021   CREATININE 0.55 06/17/2021   BUN 14 06/17/2021   CO2 31 06/17/2021   TSH 1.08 06/17/2021   INR 1.0 04/17/2020   HGBA1C 5.4 06/17/2021    BP Readings from Last 3 Encounters:  04/21/22 (!) 168/64  06/17/21 114/70  04/12/21 130/90    Lab plan   reviewed with patient   ASSESSMENT AND PLAN:  Discussed the following assessment and plan:    ICD-10-CM   1. Essential hypertension  I10     2. Visit for preventive health examination  Z00.00     3. Mixed hyperlipidemia  E78.2     4. History of CVA (cerebrovascular accident)  Z86.73      No follow-ups on file.  Patient Care Team: Preston Weill, Standley Brooking, MD as PCP - General Vickie Epley, MD as PCP - Electrophysiology (Cardiology) Everlene Farrier, MD (Obstetrics and Gynecology) Vickie Epley, MD as Consulting Physician (Cardiology) There are no Patient Instructions on file for this visit.  Standley Brooking. Audrena Talaga M.D.

## 2022-07-15 ENCOUNTER — Ambulatory Visit (INDEPENDENT_AMBULATORY_CARE_PROVIDER_SITE_OTHER): Payer: Medicare HMO | Admitting: Internal Medicine

## 2022-07-15 ENCOUNTER — Encounter: Payer: Self-pay | Admitting: Internal Medicine

## 2022-07-15 VITALS — BP 122/88 | HR 62 | Temp 97.9°F | Ht 62.5 in | Wt 117.4 lb

## 2022-07-15 DIAGNOSIS — E782 Mixed hyperlipidemia: Secondary | ICD-10-CM

## 2022-07-15 DIAGNOSIS — Z8673 Personal history of transient ischemic attack (TIA), and cerebral infarction without residual deficits: Secondary | ICD-10-CM | POA: Diagnosis not present

## 2022-07-15 DIAGNOSIS — Z5320 Procedure and treatment not carried out because of patient's decision for unspecified reasons: Secondary | ICD-10-CM | POA: Diagnosis not present

## 2022-07-15 DIAGNOSIS — Z0000 Encounter for general adult medical examination without abnormal findings: Secondary | ICD-10-CM

## 2022-07-15 DIAGNOSIS — Z79899 Other long term (current) drug therapy: Secondary | ICD-10-CM

## 2022-07-15 DIAGNOSIS — I10 Essential (primary) hypertension: Secondary | ICD-10-CM

## 2022-07-15 DIAGNOSIS — Z2821 Immunization not carried out because of patient refusal: Secondary | ICD-10-CM | POA: Diagnosis not present

## 2022-07-15 DIAGNOSIS — E2839 Other primary ovarian failure: Secondary | ICD-10-CM | POA: Diagnosis not present

## 2022-07-15 LAB — COMPREHENSIVE METABOLIC PANEL
ALT: 16 U/L (ref 0–35)
AST: 18 U/L (ref 0–37)
Albumin: 4 g/dL (ref 3.5–5.2)
Alkaline Phosphatase: 52 U/L (ref 39–117)
BUN: 14 mg/dL (ref 6–23)
CO2: 30 mEq/L (ref 19–32)
Calcium: 9.6 mg/dL (ref 8.4–10.5)
Chloride: 101 mEq/L (ref 96–112)
Creatinine, Ser: 0.63 mg/dL (ref 0.40–1.20)
GFR: 92.76 mL/min (ref >60.00)
Glucose, Bld: 90 mg/dL (ref 70–99)
Potassium: 4.2 mEq/L (ref 3.5–5.1)
Sodium: 138 mEq/L (ref 135–145)
Total Bilirubin: 0.7 mg/dL (ref 0.2–1.2)
Total Protein: 6.5 g/dL (ref 6.0–8.3)

## 2022-07-15 LAB — LIPID PANEL
Cholesterol: 228 mg/dL — ABNORMAL HIGH (ref 0–200)
HDL: 68.7 mg/dL (ref >39.00)
LDL Cholesterol: 143 mg/dL — ABNORMAL HIGH (ref 0–99)
NonHDL: 159.22
Total CHOL/HDL Ratio: 3
Triglycerides: 80 mg/dL (ref 0.0–149.0)
VLDL: 16 mg/dL (ref 0.0–40.0)

## 2022-07-15 LAB — CBC WITH DIFFERENTIAL/PLATELET
Basophils Absolute: 0.1 10*3/uL (ref 0.0–0.1)
Basophils Relative: 1 % (ref 0.0–3.0)
Eosinophils Absolute: 0.4 10*3/uL (ref 0.0–0.7)
Eosinophils Relative: 7.7 % — ABNORMAL HIGH (ref 0.0–5.0)
HCT: 42.7 % (ref 36.0–46.0)
Hemoglobin: 14.3 g/dL (ref 12.0–15.0)
Lymphocytes Relative: 22.1 % (ref 12.0–46.0)
Lymphs Abs: 1.2 10*3/uL (ref 0.7–4.0)
MCHC: 33.6 g/dL (ref 30.0–36.0)
MCV: 93 fl (ref 78.0–100.0)
Monocytes Absolute: 0.3 10*3/uL (ref 0.1–1.0)
Monocytes Relative: 6.1 % (ref 3.0–12.0)
Neutro Abs: 3.5 10*3/uL (ref 1.4–7.7)
Neutrophils Relative %: 63.1 % (ref 43.0–77.0)
Platelets: 286 10*3/uL (ref 150.0–400.0)
RBC: 4.6 Mil/uL (ref 3.87–5.11)
RDW: 13.2 % (ref 11.5–15.5)
WBC: 5.6 10*3/uL (ref 4.0–10.5)

## 2022-07-15 LAB — TSH: TSH: 1.11 u[IU]/mL (ref 0.35–5.50)

## 2022-07-15 LAB — HEMOGLOBIN A1C: Hgb A1c MFr Bld: 5.7 % (ref 4.6–6.5)

## 2022-07-15 MED ORDER — LISINOPRIL 5 MG PO TABS: 5.0000 mg | ORAL_TABLET | Freq: Every day | ORAL | 3 refills | Status: DC

## 2022-07-15 NOTE — Patient Instructions (Addendum)
Good to see you today  Ok to try decrease linsiopril to 5 mg per day . If raynauds a factor  changing BP medication to amlodipine instead of lisinipril may help suppress the Raynaud's  Let us know Bp readings after  a month or 2 of medication dosage change .  Goal 120/80 and below.   Will reorder the Dexa scan  call [phone] to schedule a Bone Density (DexaScan) at the [place] office at [place].

## 2022-07-16 NOTE — Progress Notes (Signed)
Cholesterol is back up  ( last year was perfect)  are you missing doses of crestor 10 mg daily ?  Not sure what changed . LDL goal is around 70 and last year was 76  this year 143   We can increase the dose of  crestor to 20 mg    but if  missing doses of  the 10 mg we can have you make sure taking every day    Either way recheck fasting lipid panel in 3 months to make sure  ldl comes down.   Let us know which path you will agree to .

## 2022-07-18 ENCOUNTER — Encounter: Payer: Self-pay | Admitting: Internal Medicine

## 2022-07-18 ENCOUNTER — Telehealth: Payer: Self-pay | Admitting: Internal Medicine

## 2022-07-18 NOTE — Telephone Encounter (Signed)
Returning call for results  °

## 2022-07-21 ENCOUNTER — Telehealth: Payer: Self-pay | Admitting: Internal Medicine

## 2022-07-21 DIAGNOSIS — E782 Mixed hyperlipidemia: Secondary | ICD-10-CM

## 2022-07-21 NOTE — Telephone Encounter (Signed)
Pt has been sch for lipid panel for 10-15-2022. Please put order in the info is under labs tab

## 2022-07-21 NOTE — Telephone Encounter (Signed)
Lab is placed.

## 2022-07-24 ENCOUNTER — Ambulatory Visit: Payer: Medicare HMO | Admitting: Sports Medicine

## 2022-07-31 DIAGNOSIS — E559 Vitamin D deficiency, unspecified: Secondary | ICD-10-CM | POA: Diagnosis not present

## 2022-08-18 ENCOUNTER — Telehealth: Payer: Self-pay | Admitting: Internal Medicine

## 2022-08-18 NOTE — Telephone Encounter (Signed)
Contacted Vicki Stevens to schedule their annual wellness visit. Patient declined to schedule AWV at this time.  Do not call   [name] AWV direct phone # [phone]  Patient did not think this was necessary

## 2022-08-18 NOTE — Telephone Encounter (Signed)
Called patient to schedule Medicare Annual Wellness Visit (AWV). Left message for patient to call back and schedule Medicare Annual Wellness Visit (AWV).  Last date of AWV: *AWVI 07/10/21 per pa*[place]   Please schedule an appointment at any time with [facility] or Dahlia Client kim  If any questions, please contact me at [phone].  Thank you ,  [name] AWV direct phone # [phone]

## 2022-08-25 ENCOUNTER — Encounter: Payer: Self-pay | Admitting: *Deleted

## 2022-08-30 DIAGNOSIS — R69 Illness, unspecified: Secondary | ICD-10-CM | POA: Diagnosis not present

## 2022-10-15 ENCOUNTER — Other Ambulatory Visit: Payer: Medicare HMO

## 2022-11-21 DIAGNOSIS — R69 Illness, unspecified: Secondary | ICD-10-CM | POA: Diagnosis not present

## 2023-02-13 ENCOUNTER — Other Ambulatory Visit: Payer: Self-pay | Admitting: Internal Medicine

## 2023-02-13 DIAGNOSIS — Z1212 Encounter for screening for malignant neoplasm of rectum: Secondary | ICD-10-CM

## 2023-02-13 DIAGNOSIS — Z1211 Encounter for screening for malignant neoplasm of colon: Secondary | ICD-10-CM

## 2023-02-16 ENCOUNTER — Encounter: Payer: Self-pay | Admitting: Internal Medicine

## 2023-04-03 DIAGNOSIS — R69 Illness, unspecified: Secondary | ICD-10-CM | POA: Diagnosis not present

## 2023-11-06 ENCOUNTER — Ambulatory Visit (INDEPENDENT_AMBULATORY_CARE_PROVIDER_SITE_OTHER): Admitting: Medical

## 2023-11-06 ENCOUNTER — Encounter: Payer: Self-pay | Admitting: Medical

## 2023-11-06 VITALS — BP 175/88 | HR 60 | Temp 97.7°F | Resp 18 | Ht 62.5 in | Wt 110.0 lb

## 2023-11-06 DIAGNOSIS — K146 Glossodynia: Secondary | ICD-10-CM | POA: Diagnosis not present

## 2023-11-06 DIAGNOSIS — B370 Candidal stomatitis: Secondary | ICD-10-CM | POA: Diagnosis not present

## 2023-11-06 DIAGNOSIS — K140 Glossitis: Secondary | ICD-10-CM | POA: Diagnosis not present

## 2023-11-06 MED ORDER — NYSTATIN 100000 UNIT/ML MT SUSP: 5.0000 mL | Freq: Four times a day (QID) | OROMUCOSAL | 0 refills | Status: AC

## 2023-11-06 MED ORDER — FLUCONAZOLE 150 MG PO TABS: 150.0000 mg | ORAL_TABLET | Freq: Every day | ORAL | 0 refills | Status: AC

## 2023-11-06 MED ORDER — NYSTATIN 100000 UNIT/ML MT SUSP: 5.0000 mL | Freq: Four times a day (QID) | OROMUCOSAL | 0 refills | Status: DC

## 2023-11-06 NOTE — Progress Notes (Signed)
 Subjective:    Patient ID: Vicki Stevens, female    DOB: [birth year], 68 y.o.   MRN: [id number]  HPI Vicki Stevens is a 68 year old female who presents with symptoms suggestive of oral thrush.  She began experiencing a change in taste a few days ago, initially attributing it to the food. Last night, she experienced a burning sensation on her tongue during her usual oral hygiene routine, which she found unusual. This morning, she noted her mouth felt very dry and her tongue was burning and felt 'fuzzy'.  She recently completed a course of amoxicillin 875 mg for a dental issue, which she took for five days and finished approximately four days ago. She has observed a slight white film on the back of her tongue, which she attempted to brush away. She thinks may have thrush  No fatigue. She runs five to eight miles daily without feeling tired. Her A1C has always been normative at 5.7%.  Bp very high intially. Pt reports no motor or sensory deficits. Pt has history of stroke in 2022. Pt was on lisinopril  in the past for about 8-9 months ago. She thinks bp level being high may be related to white coat hypertension. She has various blood pressure machines at home which she states give accurate readings. She has not checked her bp recently.     Review of Systems  Constitutional:  Negative for chills, fatigue and fever.  HENT:  Negative for sore throat and trouble swallowing.        See hpi.  Respiratory:  Negative for cough, chest tightness and wheezing.        None reported  Cardiovascular:  Negative for chest pain and palpitations.  Gastrointestinal:  Negative for abdominal pain.       None reported.  Genitourinary:  Negative for dysuria.  Musculoskeletal:  Negative for back pain.  Neurological:  Negative for dizziness, seizures, syncope, facial asymmetry, speech difficulty, weakness, light-headedness and headaches.  Hematological:  Negative for adenopathy. Does not bruise/bleed  easily.  Psychiatric/Behavioral:  Negative for behavioral problems and decreased concentration.    Past Medical History:  Diagnosis Date   COVID-19 2021   H/O hematuria remote   dr Andra felt to be from running    Heart murmur    evaluation ? mvp when younger   Hx of varicella    Hypertension    LOW BACK PAIN, ACUTE 03/05/2010   Stroke (HCC) 04/17/2020     Social History   Socioeconomic History   Marital status: Married    Spouse name: Ubaldo Ronde   Number of children: Not on file   Years of education: Not on file   Highest education level: Not on file  Occupational History   Occupation: part time/self employed  Tobacco Use   Smoking status: Never   Smokeless tobacco: Never  Vaping Use   Vaping status: Never Used  Substance and Sexual Activity   Alcohol use: Yes    Comment: occasonally   Drug use: Never   Sexual activity: Not on file  Other Topics Concern   Not on file  Social History Narrative   Lives with Husband   caffeine 2-3 in am ocass    Right handed   Social Drivers of Health   Financial Resource Strain: Not on file  Food Insecurity: Not on file  Transportation Needs: Not on file  Physical Activity: Not on file  Stress: Not on file  Social Connections: Unknown (09/24/2021)  Received from [facility]   Social Network    Social Network: Not on file  Intimate Partner Violence: Unknown (08/14/2021)   Received from [facility]   HITS    Physically Hurt: Not on file    Insult or Talk Down To: Not on file    Threaten Physical Harm: Not on file    Scream or Curse: Not on file    Past Surgical History:  Procedure Laterality Date   BUBBLE STUDY  04/19/2020   Procedure: BUBBLE STUDY;  Surgeon: Loni Soyla LABOR, MD;  Location: [facility] ENDOSCOPY;  Service: Cardiology;;   LOOP RECORDER INSERTION N/A 04/19/2020   Procedure: LOOP RECORDER INSERTION;  Surgeon: Cindie Ole DASEN, MD;  Location: [facility] INVASIVE CV LAB;  Service: Cardiovascular;  Laterality: N/A;    TEE WITHOUT CARDIOVERSION N/A 04/19/2020   Procedure: TRANSESOPHAGEAL ECHOCARDIOGRAM (TEE);  Surgeon: Loni Soyla LABOR, MD;  Location: [facility] ENDOSCOPY;  Service: Cardiology;  Laterality: N/A;    Family History  Problem Relation Age of Onset   Other Mother        MVA 67    Stroke Father    Lung cancer Father        37    No Known Allergies  Current Outpatient Medications on File Prior to Visit  Medication Sig Dispense Refill   lisinopril  (ZESTRIL ) 5 MG tablet Take 1 tablet (5 mg total) by mouth daily. 90 tablet 3   rosuvastatin  (CRESTOR ) 10 MG tablet Take 10 mg by mouth daily.     No current facility-administered medications on file prior to visit.    BP (!) 175/88   Pulse 60   Temp 97.7 F (36.5 C)   Resp 18   Ht 5' 2.5 (1.588 m)   Wt 110 lb (49.9 kg)   SpO2 99%   BMI 19.80 kg/m         Objective:   Physical Exam  General Mental Status- Alert. General Appearance- Not in acute distress.   Skin General: Color- Normal Color. Moisture- Normal Moisture.  Neck  No JVD.  Chest and Lung Exam Auscultation: Breath Sounds:-CTA  Cardiovascular Auscultation:Rythm- RRR Murmurs & Other Heart Sounds:Auscultation of the heart reveals- No Murmurs.  Abdomen Inspection:-Inspeection Normal. Palpation/Percussion:Note:No mass. Palpation and Percussion of the abdomen reveal- Non Tender, Non Distended + BS, no rebound or guarding.   Neurologic Cranial Nerve exam:- CN III-XII intact(No nystagmus), symmetric smile. Romberg Exam:- Negative.  Heal to Toe Gait exam:-Normal. Finger to Nose:- Normal/Intact Strength:- 5/5 equal and symmetric strength both upper and lower extremities.       Assessment & Plan:   Patient Instructions  Early Thrush(probable) Suspected due to recent amoxicillin use. Symptoms include burning tongue, dry mouth, whitish film to tongue. Differential includes burning mouth syndrome and glossitis, but thrush is most likely. - Prescribed Diflucan  (fluconazole) one tablet, single dose. - Prescribed Magic Mouthwash for symptomatic relief, up to four times daily.\swish and spit - Monitor response to Diflucan and Magic Mouthwash. - Consider B12 and folate levels if symptoms persist.(potential other labs)   Htn-higher than normal and remote hx of stroke years ago -normal neurologic exam -Bp is high today. Take 10 mg lisinopril  daily you have at home -check bp daily over next 3 days.  -Update me and your pcp on bp level Monday -reduce salt intake -if signs and symptoms change as discussed then be seen in the ED.   Follow up date to be determined after lab review.

## 2023-11-06 NOTE — Patient Instructions (Addendum)
 Early Thrush(probable) Suspected due to recent amoxicillin use. Symptoms include burning tongue, dry mouth, whitish film to tongue. Differential includes burning mouth syndrome and glossitis, but thrush is most likely. - Prescribed Diflucan (fluconazole) one tablet, single dose. - Prescribed Magic Mouthwash for symptomatic relief, up to four times daily.\swish and spit - Monitor response to Diflucan and Magic Mouthwash. - Consider B12 and folate levels if symptoms persist.(potential other labs)   Htn-higher than normal and remote hx of stroke years ago -normal neurologic exam -Bp is high today. Take 10 mg lisinopril  daily you have at home -check bp daily over next 3 days.  -Update me and your pcp on bp level Monday -reduce salt intake -if  higher bp or signs and symptoms change as discussed then be seen in the ED.   Follow up date to be determined after lab review.

## 2023-11-19 ENCOUNTER — Ambulatory Visit: Payer: Self-pay

## 2023-11-19 NOTE — Telephone Encounter (Signed)
 FYI Only or Action Required?: FYI only for provider.  Patient was last seen in primary care on 11/06/2023 by Dorina Loving, PA-C.  Called Nurse Triage reporting Blood pressure.  Symptoms began several weeks ago.  Interventions attempted: Prescription medications: Lisinopril  and Rest, hydration, or home remedies.  Symptoms are: unchanged.  Triage Disposition: See PCP Within 2 Weeks  Patient/caregiver understands and will follow disposition?: Yes       Copied from CRM [id number]. Topic: Clinical - Red Word Triage >> Nov 19, 2023  9:49 AM Larissa RAMAN wrote: [facility] that prompted transfer to Nurse Triage: blood pressure 160/100, 170/100, 141/88   ----------------------------------------------------------------------- From previous Reason for Contact - Scheduling: Patient/patient representative is calling to schedule an appointment. Refer to attachments for appointment information. Reason for Disposition  [1] Systolic BP >= 130 OR Diastolic >= 80 AND [2] taking BP medications  Answer Assessment - Initial Assessment Questions 1. BLOOD PRESSURE: What is your blood pressure? Did you take at least two measurements 5 minutes apart?    146/85 today, but has been fluctuating very high the past week, with low HR at 52  2. ONSET: When did you take your blood pressure?     Fluctuating over the past 2 weeks  3. HOW: How did you take your blood pressure? (e.g., automatic home BP monitor, visiting nurse)     Home BP monitor  4. HISTORY: Do you have a history of high blood pressure?     Yes  5. MEDICINES: Are you taking any medicines for blood pressure? Have you missed any doses recently?     Yes, taking Lisinopril  5mg . Was advised on 6/27 to increase medication to 10mg   6. OTHER SYMPTOMS: Do you have any symptoms? (e.g., blurred vision, chest pain, difficulty breathing, headache, weakness)     No symptoms  7. PREGNANCY: Is there any chance you are pregnant? When was  your last menstrual period?     No  Protocols used: Blood Pressure - High-A-AH

## 2023-11-20 ENCOUNTER — Ambulatory Visit (INDEPENDENT_AMBULATORY_CARE_PROVIDER_SITE_OTHER): Admitting: Family Medicine

## 2023-11-20 ENCOUNTER — Encounter: Payer: Self-pay | Admitting: Family Medicine

## 2023-11-20 VITALS — BP 132/58 | HR 65 | Temp 97.7°F | Wt 110.9 lb

## 2023-11-20 DIAGNOSIS — R0989 Other specified symptoms and signs involving the circulatory and respiratory systems: Secondary | ICD-10-CM | POA: Diagnosis not present

## 2023-11-20 LAB — CORTISOL: Cortisol, Plasma: 6.7 ug/dL

## 2023-11-20 MED ORDER — LISINOPRIL 10 MG PO TABS: 10.0000 mg | ORAL_TABLET | Freq: Every day | ORAL | 0 refills | Status: DC

## 2023-11-20 NOTE — Progress Notes (Unsigned)
 Established Patient Office Visit  Subjective   Patient ID: Vicki Stevens, female    DOB: Apr 04, 1956  Age: 68 y.o. MRN: [id number]  Chief Complaint  Patient presents with   Medical Management of Chronic Issues    HPI  {History (Optional):23778} Vicki Stevens is seen today with very labile blood pressure readings recently.  She is very health-conscious and exercises regularly.  In fact, she frequently runs up to 8 miles per day.  She has had no difficulty with running recently and generally feels very well.  She has had greatly fluctuating blood pressures with some days having readings up to 170s systolic but she is also had readings as low as 90s systolic.  She denies any recent headaches.  No peripheral edema.  No chest pains or dyspnea.  No dizziness.  She does have past history of CVA about 4 years ago.  Mild hyperlipidemia.  She has hypertension history and is currently on lisinopril  10 mg daily and taking this regularly.  Not currently taking her rosuvastatin .  Past Medical History:  Diagnosis Date   COVID-19 2021   H/O hematuria remote   dr Andra felt to be from running    Heart murmur    evaluation ? mvp when younger   Hx of varicella    Hypertension    LOW BACK PAIN, ACUTE 03/05/2010   Stroke (HCC) 04/17/2020   Past Surgical History:  Procedure Laterality Date   BUBBLE STUDY  04/19/2020   Procedure: BUBBLE STUDY;  Surgeon: Loni Soyla LABOR, MD;  Location: [facility] ENDOSCOPY;  Service: Cardiology;;   LOOP RECORDER INSERTION N/A 04/19/2020   Procedure: LOOP RECORDER INSERTION;  Surgeon: Cindie Ole DASEN, MD;  Location: [facility] INVASIVE CV LAB;  Service: Cardiovascular;  Laterality: N/A;   TEE WITHOUT CARDIOVERSION N/A 04/19/2020   Procedure: TRANSESOPHAGEAL ECHOCARDIOGRAM (TEE);  Surgeon: Loni Soyla LABOR, MD;  Location: [facility] ENDOSCOPY;  Service: Cardiology;  Laterality: N/A;    reports that she has never smoked. She has never used smokeless tobacco. She reports current alcohol use.  She reports that she does not use drugs. family history includes Lung cancer in her father; Other in her mother; Stroke in her father. No Known Allergies  Review of Systems  Constitutional:  Negative for chills, fever and malaise/fatigue.  Eyes:  Negative for blurred vision.  Respiratory:  Negative for shortness of breath.   Cardiovascular:  Negative for chest pain.  Neurological:  Negative for dizziness, weakness and headaches.      Objective:     BP (!) 132/58 (BP Location: Left Arm, Cuff Size: Normal)   Pulse 65   Temp 97.7 F (36.5 C) (Oral)   Wt 110 lb 14.4 oz (50.3 kg)   SpO2 99%   BMI 19.96 kg/m  BP Readings from Last 3 Encounters:  11/20/23 (!) 132/58  11/06/23 (!) 175/88  07/15/22 122/88   Wt Readings from Last 3 Encounters:  11/20/23 110 lb 14.4 oz (50.3 kg)  11/06/23 110 lb (49.9 kg)  07/15/22 117 lb 6.4 oz (53.3 kg)      Physical Exam Vitals reviewed.  Constitutional:      General: She is not in acute distress.    Appearance: She is well-developed. She is not ill-appearing.  Eyes:     Pupils: Pupils are equal, round, and reactive to light.  Neck:     Thyroid : No thyromegaly.     Vascular: No JVD.  Cardiovascular:     Rate and Rhythm: Normal rate and regular rhythm.  Heart sounds:     No gallop.  Pulmonary:     Effort: Pulmonary effort is normal. No respiratory distress.     Breath sounds: Normal breath sounds. No wheezing or rales.  Abdominal:     Comments: No abdominal bruits  Musculoskeletal:     Cervical back: Neck supple.     Right lower leg: No edema.     Left lower leg: No edema.  Neurological:     Mental Status: She is alert.      Results for orders placed or performed in visit on 11/20/23  Cortisol  Result Value Ref Range   Cortisol, Plasma 6.7 ug/dL    {Labs (Neupnwjo):76220}  The ASCVD Risk score (Arnett DK, et al., 2019) failed to calculate for the following reasons:   Risk score cannot be calculated because patient  has a medical history suggesting prior/existing ASCVD    Assessment & Plan:   Problem List Items Addressed This Visit   None Visit Diagnoses       Labile hypertension    -  Primary   Relevant Medications   lisinopril  (ZESTRIL ) 10 MG tablet   Other Relevant Orders   Aldosterone + renin activity w/ ratio   Metanephrines, plasma   Cortisol (Completed)     68 year old female with prior history of CVA and hypertension.  She presents with several weeks of very labile blood pressure with several normal readings interspersed with readings up as high as 180 systolic.  Denies any symptoms such as headache, blurred vision, peripheral edema, etc.  She is compliant with her lisinopril . Etiology unclear.  -We discussed possible further evaluation for secondary causes given vastly different readings. - Discussed checking aldosterone-renin ratio - Plasma metanephrines - Plasma cortisol - Renal artery ultrasound  If above unrevealing consider hypertension clinic through cardiology  No follow-ups on file.    Wolm Scarlet, MD

## 2023-11-22 ENCOUNTER — Ambulatory Visit: Payer: Self-pay | Admitting: Family Medicine

## 2023-11-27 LAB — ALDOSTERONE + RENIN ACTIVITY W/ RATIO
ALDO / PRA Ratio: 0.6 ratio — ABNORMAL LOW (ref 0.9–28.9)
Aldosterone: 7 ng/dL
Renin Activity: 11.49 ng/mL/h — ABNORMAL HIGH (ref 0.25–5.82)

## 2023-11-27 LAB — METANEPHRINES, PLASMA
Metanephrine, Free: <25 pg/mL (ref <=57)
Normetanephrine, Free: 121 pg/mL (ref <=148)
Total Metanephrines-Plasma: 121 pg/mL (ref <=205)

## 2023-11-30 ENCOUNTER — Telehealth: Payer: Self-pay | Admitting: Internal Medicine

## 2023-11-30 NOTE — Telephone Encounter (Signed)
 Please see result note

## 2023-11-30 NOTE — Telephone Encounter (Signed)
 Copied from CRM [id number]. Topic: General - Call Back - No Documentation >> Nov 30, 2023  8:53 AM Avram MATSU wrote: Reason for CRM: patient stated she missed a call from [facility] and I informed her he left a message in her MyChart but she can not see it, patient would like a callback [phone]

## 2023-12-03 ENCOUNTER — Ambulatory Visit (HOSPITAL_COMMUNITY)
Admission: RE | Admit: 2023-12-03 | Discharge: 2023-12-03 | Disposition: A | Discharge status: Home / Self Care | Source: Ambulatory Visit | Attending: Family Medicine | Admitting: Family Medicine

## 2023-12-03 ENCOUNTER — Encounter: Payer: Self-pay | Admitting: Family Medicine

## 2023-12-03 DIAGNOSIS — R0989 Other specified symptoms and signs involving the circulatory and respiratory systems: Secondary | ICD-10-CM | POA: Insufficient documentation

## 2023-12-04 ENCOUNTER — Ambulatory Visit (HOSPITAL_COMMUNITY)

## 2023-12-04 LAB — LAB REPORT - SCANNED: EGFR: 100

## 2023-12-08 ENCOUNTER — Ambulatory Visit (INDEPENDENT_AMBULATORY_CARE_PROVIDER_SITE_OTHER): Admitting: Family Medicine

## 2023-12-08 ENCOUNTER — Encounter: Payer: Self-pay | Admitting: Family Medicine

## 2023-12-08 VITALS — BP 142/82 | HR 76 | Temp 97.9°F | Wt 112.9 lb

## 2023-12-08 DIAGNOSIS — Z780 Asymptomatic menopausal state: Secondary | ICD-10-CM | POA: Diagnosis not present

## 2023-12-08 DIAGNOSIS — I10 Essential (primary) hypertension: Secondary | ICD-10-CM

## 2023-12-08 NOTE — Progress Notes (Signed)
 Established Patient Office Visit  Subjective   Patient ID: Vicki Stevens, female    DOB: 1956-05-09  Age: 68 y.o. MRN: [id number]  Chief Complaint  Patient presents with   Results    HPI   Vicki Stevens is seen for follow-up hypertension.  She was seen recently and had very sporadic readings with some low readings but some very high readings as well.  She is very health-conscious and runs regularly.  We had increased her lisinopril  to 10 mg daily but she still had very poorly controlled blood pressures.  She seemed to have relatively acute worsening of her blood pressure.  We obtained renal artery duplex scan which showed normal flow to both kidneys.  Her plasma metanephrines were normal.  Did have increased renin activity and slightly low aldosterone.  She went and obtain comprehensive metabolic panel and had creatinine 0.57 with potassium 4.8 with normal sodium.  GFR normal.  She added low-dose magnesium 250 mg once daily as a supplement and almost immediately started seeing dramatic improvement in blood pressures.  In fact, she has had several readings low 100s and even occasionally below 100 systolic.  These been fairly consistently well-controlled since adding her magnesium.  She is currently on lisinopril  10 mg daily.  We had discussed even bumping this up to 20 mg daily but she never did after blood pressures came down on magnesium feels better overall.  Does admit that she may not hydrate well immediately after her runs.  Sometimes runs twice daily.  She is also requesting order for DEXA scan.  Has never had DEXA scan.  Stays very active throughout her life and no history of smoking.  Past Medical History:  Diagnosis Date   COVID-19 2021   H/O hematuria remote   dr Andra felt to be from running    Heart murmur    evaluation ? mvp when younger   Hx of varicella    Hypertension    LOW BACK PAIN, ACUTE 03/05/2010   Stroke (HCC) 04/17/2020   Past Surgical History:  Procedure  Laterality Date   BUBBLE STUDY  04/19/2020   Procedure: BUBBLE STUDY;  Surgeon: Loni Soyla LABOR, MD;  Location: [facility] ENDOSCOPY;  Service: Cardiology;;   LOOP RECORDER INSERTION N/A 04/19/2020   Procedure: LOOP RECORDER INSERTION;  Surgeon: Cindie Ole DASEN, MD;  Location: [facility] INVASIVE CV LAB;  Service: Cardiovascular;  Laterality: N/A;   TEE WITHOUT CARDIOVERSION N/A 04/19/2020   Procedure: TRANSESOPHAGEAL ECHOCARDIOGRAM (TEE);  Surgeon: Loni Soyla LABOR, MD;  Location: [facility] ENDOSCOPY;  Service: Cardiology;  Laterality: N/A;    reports that she has never smoked. She has never used smokeless tobacco. She reports current alcohol use. She reports that she does not use drugs. family history includes Lung cancer in her father; Other in her mother; Stroke in her father. No Known Allergies  Review of Systems  Constitutional:  Negative for malaise/fatigue.  Eyes:  Negative for blurred vision.  Respiratory:  Negative for shortness of breath.   Cardiovascular:  Negative for chest pain.  Neurological:  Negative for dizziness, weakness and headaches.      Objective:     BP (!) 142/82 (BP Location: Left Arm, Cuff Size: Normal)   Pulse 76   Temp 97.9 F (36.6 C) (Oral)   Wt 112 lb 14.4 oz (51.2 kg)   SpO2 99%   BMI 20.32 kg/m  BP Readings from Last 3 Encounters:  12/08/23 (!) 142/82  11/20/23 (!) 132/58  11/06/23 (!) 175/88  Wt Readings from Last 3 Encounters:  12/08/23 112 lb 14.4 oz (51.2 kg)  11/20/23 110 lb 14.4 oz (50.3 kg)  11/06/23 110 lb (49.9 kg)      Physical Exam Vitals reviewed.  Constitutional:      General: She is not in acute distress.    Appearance: She is not ill-appearing.  Cardiovascular:     Rate and Rhythm: Normal rate and regular rhythm.  Pulmonary:     Effort: Pulmonary effort is normal.     Breath sounds: Normal breath sounds.  Musculoskeletal:     Right lower leg: No edema.     Left lower leg: No edema.  Neurological:     Mental Status: She is  alert.      No results found for any visits on 12/08/23.    The ASCVD Risk score (Arnett DK, et al., 2019) failed to calculate for the following reasons:   Risk score cannot be calculated because patient has a medical history suggesting prior/existing ASCVD    Assessment & Plan:   Problem List Items Addressed This Visit       Unprioritized   Essential hypertension - Primary   Relevant Orders   Magnesium, RBC   Other Visit Diagnoses       Postmenopausal       Relevant Orders   DG Bone Density     Hypertension greatly improved.  Patient requesting red blood cell magnesium.  She had dramatic improvement in blood pressures after starting magnesium supplement.  Remains currently on lisinopril  10 mg daily.  Continue current dosage.  No need for further imaging of the adrenals unless her blood pressure starts to spike again.  Continue to monitor.  Did have some recent increased renin activity which could potentially be related to volume depletion.  Stressed importance of adequate hydration especially right after her workouts.  DEXA scan ordered per patient request.  No follow-ups on file.    Wolm Scarlet, MD

## 2023-12-09 ENCOUNTER — Encounter: Payer: Self-pay | Admitting: Family Medicine

## 2023-12-09 DIAGNOSIS — E2839 Other primary ovarian failure: Secondary | ICD-10-CM

## 2023-12-10 ENCOUNTER — Telehealth: Payer: Self-pay | Admitting: Internal Medicine

## 2023-12-10 NOTE — Telephone Encounter (Signed)
 Left voice mail for pt - she is interested in scheduled a Bone Density scan at [facility] - I let pt know to call back to schedule a Bone Density scan.

## 2023-12-21 ENCOUNTER — Encounter: Payer: Self-pay | Admitting: Family Medicine

## 2023-12-21 DIAGNOSIS — I10 Essential (primary) hypertension: Secondary | ICD-10-CM

## 2023-12-22 MED ORDER — LISINOPRIL 5 MG PO TABS: 5.0000 mg | ORAL_TABLET | Freq: Every day | ORAL | 3 refills | Status: AC

## 2024-01-12 ENCOUNTER — Encounter: Payer: Self-pay | Admitting: Sports Medicine

## 2024-02-06 ENCOUNTER — Encounter: Payer: Self-pay | Admitting: Internal Medicine

## 2024-02-19 ENCOUNTER — Emergency Department (HOSPITAL_BASED_OUTPATIENT_CLINIC_OR_DEPARTMENT_OTHER)
Admission: EM | Admit: 2024-02-19 | Discharge: 2024-02-19 | Disposition: A | Discharge status: Home / Self Care | Attending: Emergency Medicine | Admitting: Emergency Medicine

## 2024-02-19 ENCOUNTER — Other Ambulatory Visit: Payer: Self-pay

## 2024-02-19 DIAGNOSIS — W01198A Fall on same level from slipping, tripping and stumbling with subsequent striking against other object, initial encounter: Secondary | ICD-10-CM | POA: Diagnosis not present

## 2024-02-19 DIAGNOSIS — S0181XA Laceration without foreign body of other part of head, initial encounter: Secondary | ICD-10-CM | POA: Diagnosis not present

## 2024-02-19 DIAGNOSIS — I10 Essential (primary) hypertension: Secondary | ICD-10-CM | POA: Insufficient documentation

## 2024-02-19 DIAGNOSIS — S0990XA Unspecified injury of head, initial encounter: Secondary | ICD-10-CM | POA: Diagnosis not present

## 2024-02-19 DIAGNOSIS — Z79899 Other long term (current) drug therapy: Secondary | ICD-10-CM | POA: Insufficient documentation

## 2024-02-19 DIAGNOSIS — S01112A Laceration without foreign body of left eyelid and periocular area, initial encounter: Secondary | ICD-10-CM | POA: Insufficient documentation

## 2024-02-19 DIAGNOSIS — W19XXXA Unspecified fall, initial encounter: Secondary | ICD-10-CM

## 2024-02-19 MED ORDER — LIDOCAINE-EPINEPHRINE-TETRACAINE (LET) TOPICAL GEL
3.0000 mL | Freq: Once | TOPICAL | Status: DC
  Filled 2024-02-19: qty 3

## 2024-02-19 MED ORDER — LIDOCAINE HCL (PF) 1 % IJ SOLN
5.0000 mL | Freq: Once | INTRAMUSCULAR | Status: DC
  Filled 2024-02-19: qty 5

## 2024-02-19 NOTE — ED Provider Notes (Signed)
 [place] EMERGENCY DEPARTMENT AT [facility] Provider Note   CSN: [id number] Arrival date & time: 02/19/24  1005     Patient presents with: [place]   Vicki Stevens is a 68 y.o. female with history of CVA, hypertension, presents with concern for mechanical fall that occurred earlier today.  She reports she was running when she tripped over an object, causing her to fall onto her left knee and hands.  She also hit the left side of her forehead.  She denies hitting her head or any loss consciousness.  She is not on any anticoagulation.  She denies any headache, vision changes, nausea or vomiting.  Denies any neck or back pain.  She was able to get up and walk back to her house and take a shower after this occurred before coming to the emergency room.  She is concerned that she may need sutures to the cut to her forehead.  She is unsure when her last tetanus was.    Fall       Prior to Admission medications   Medication Sig Start Date End Date Taking? Authorizing Provider  fluconazole  (DIFLUCAN ) 150 MG tablet Take 1 tablet (150 mg total) by mouth daily. 11/06/23   Saguier, [place], PA-C  lisinopril  (ZESTRIL ) 5 MG tablet Take 1 tablet (5 mg total) by mouth daily. 12/22/23   Burchette, Wolm ORN, MD  magic mouthwash (nystatin , lidocaine , diphenhydrAMINE, alum & mag hydroxide) suspension Swish and spit 5 mLs 4 (four) times daily. 11/06/23   Saguier, [place], PA-C  rosuvastatin  (CRESTOR ) 10 MG tablet Take 10 mg by mouth daily.    [provider]    Allergies: Patient has no known allergies.    Review of Systems  Skin:  Positive for wound.    Updated Vital Signs BP (!) 183/89   Pulse 62   Temp 98.4 F (36.9 C) (Oral)   Resp 16   SpO2 98%   Physical Exam Vitals and nursing note reviewed.  Constitutional:      General: She is not in acute distress.    Appearance: Normal appearance.  HENT:     Head: Normocephalic and atraumatic.     Comments: No battle sign No raccoon  eyes No tenderness to palpation of the skull diffusely.  1.5 cm superficial laceration that extends laterally over the forehead.  Mild bleeding.  No visualized foreign body.  2 cm vertical laceration to the edge of the left eyebrow.  Mild bleeding.  No visualized foreign debris.  Eyes:     Extraocular Movements: Extraocular movements intact.     Pupils: Pupils are equal, round, and reactive to light.  Neck:     Comments: No spinal tenderness to palpation Able to rotate neck left and right 45 degrees without difficulty Cardiovascular:     Rate and Rhythm: Normal rate and regular rhythm.     Comments: 2+ radial pulse bilaterally Pulmonary:     Effort: Pulmonary effort is normal.     Breath sounds: Normal breath sounds.     Comments: Lung sounds present and clear to auscultation bilaterally Talks in full sentences without difficulty Abdominal:     Comments: Abdomen soft and non-tender.    Musculoskeletal:     Cervical back: Normal range of motion.     Comments: General 1.5 cm superficial laceration to the left forehead.  2 cm laceration to the outer edge of the left eyebrow.  Mild bleeding upon arrival of the sites, no visualized foreign debris.  Abrasion  to the dorsum of the left hand and left elbow.  No active bleeding or deep lacerations.    Abrasion to the left knee with mild edema  Palpation Non-tender to palpation of the left clavicle, humerus, radius and ulna, carpal bones, 1st-5th metacarpals and phalanges  Non-tender to palpation of the right clavicle, humerus, radius and ulna, carpal bones, 1st-5th metacarpals and phalanges  Non tender over the pelvis.  Non-tender of the left femur, patella, tibia or fibula .  She has mild tenderness over the abrasion site, but no bony tenderness to palpation over the left knee. Non-tender of the right femur, patella, tibia or fibula   Non-tender over the cervical, thoracic, or lumbar spinous processes. Non-tender to palpation of the  paraspinal region of the back. No tenderness to palpation of chest wall diffusely  ROM Full ROM of shoulders bilaterally Full elbow, wrist, knee flexion and extension bilaterally Intact plantarflexion and dorsiflexion, hip flexion bilaterally Able to ambulate without difficulty   Neurological:     General: No focal deficit present.     Mental Status: She is alert.     Comments: Sensation: Sensation intact throughout the bilateral upper and lower extremities  Strength: 5/5 strength with resisted elbow and wrist flexion and extension bilaterally 5/5 strength with resisted knee flexion and extension and ankle plantarflexion and dorsiflexion bilaterally       (all labs ordered are listed, but only abnormal results are displayed) Labs Reviewed - No data to display  EKG: None  Radiology: No results found.   Procedures   Medications Ordered in the ED  lidocaine -EPINEPHrine -tetracaine  (LET) topical gel (has no administration in time range)  lidocaine  (PF) (XYLOCAINE ) 1 % injection 5 mL (has no administration in time range)    Clinical Course as of 02/19/24 1343  Fri Feb 19, 2024  1123 I recommended tetanus shot as patient is unsure when her last tetanus was.  She declines a tetanus shot today as she would like to follow-up with her PCP regarding her tetanus status before getting a tetanus shot [AF]    Clinical Course User Index [AF] Veta Palma, PA-C                                 Medical Decision Making Risk Prescription drug management.    Differential diagnosis includes but is not limited to laceration, wound infection, tendon injury, nerve injury, vascular injury, retained foreign body, fracture, dislocation  ED Course:  Upon initial evaluation, patient is well-appearing, no acute distress.  Patient has good story for mechanical fall that occurred earlier today.  She does not have any bony tenderness to palpation of the upper or lower extremities  bilaterally.  She has full range of motion of the upper and lower extremities bilaterally and is able to ambulate without difficulty.  No cervical, thoracic, or lumbar spinal tenderness to palpation.  No tenderness to the chest wall diffusely. Low concern for acute fracture or dislocation.  No indication for x-ray imaging at this time.  She does have laceration to the left side of her eyebrow and forehead.  The laceration to the more proximal aspect of the forehead is very well-approximated.  The laceration to the right edge of the eyebrow is not well-approximated and deeper.  This will need suture repair.  She was consented for suture repair and is in agreement with suture repair for the lower laceration and Dermabond for the more proximal laceration.  Do not feel she needs CT of the head as she does not have any skull tenderness to palpation, low concern for acute skull fracture.  No headaches, nausea or vomiting, vision changes, she is not on any anticoagulation, low concern for intracranial hemorrhage.    Medications Given: LET  and lidocaine   Patient's wound was irrigated well with sterile saline and cleaned with chlorhexidine swabs. Anesthesia achieved with LET and lidocaine  injection performed by Candelario Kidney, PA-C. Patient's laceration was repaired by Oscar Kidney, PA-C.  Please see separate procedure note for details.  Patient tolerated this well without any immediate complications. Suture repair occurred less than 24 hours after initial injury. Patient remains neurovascularly intact after suture placement.  Patient declined Tdap today and would like to discuss this with her PCP if she needs a Tdap.  I reviewed patient's records which showed her last Tdap seems to be in 2008 and I did recommend a Tdap for the patient, but she declines.  Patient stable and appropriate for discharge home.     Impression: Left forehead laceration  Disposition:  Patient discharged home with instructions to keep  laceration site clean with gentle soap and water. Apply neosporin or bacitracin over the area as shown here and keep covered with sterile dressing. Follow up with PCP or return to ER in 5 days for suture/staple removal. Tylenol  and ibuprofen as needed for pain.  Allow the Dermabond to fall off on its own. Return precautions given.   This chart was dictated using voice recognition software, Dragon. Despite the best efforts of this provider to proofread and correct errors, errors may still occur which can change documentation meaning.       Final diagnoses:  Laceration of left eyebrow, initial encounter  Laceration of forehead without complication, initial encounter  Fall, initial encounter    ED Discharge Orders     None          Veta Palma, PA-C 02/19/24 1344    Levander Houston, MD 02/19/24 1459

## 2024-02-19 NOTE — Discharge Instructions (Addendum)
 You had 4 non-absorbable sutures placed today. You must get your sutures rechecked for possible removal in 5 days. We recommend visiting your PCP or an urgent care for suture removal. However, you may also return back to the ER if you are unable to be seen by your PCP or at urgent care.   It appears that you need a tetanus booster.  You declined this here in the ER.  Please discuss this with your PCP as soon as possible.  You may gently clean the area around your laceration as needed with water. Place antibiotic ointment such as bacitracin or neosporin over your laceration after cleaning the area.  Keep the laceration covered with sterile gauze as shown here if you are doing an activity in which it may get dirty. You may pick these supplies up at any drugstore.  Do not submerge your laceration in water (no baths, swimming) until it is fully healed. You may shower.   Your cut today was covered with skin glue to help it heal.  Please follow the instructions below:  Do not scratch, rub, or pick at the adhesive. Leave tissue adhesive in place. It will come off naturally after 7-10 days. Do not place tape over the adhesive. The adhesive could come off the wound when you pull the tape off. Do not take baths, swim, or use a hot tubs. You can shower after the first 24 hours. Cover the dressing with a watertight covering when you take a shower. Do not use any soaps, petroleum jelly products, or ointments on the wound. Certain ointments can weaken the adhesive.   Return to the ER should you develop fever, chills, pus drainage from your wound, redness around your wound, any other new or concerning symptoms

## 2024-02-19 NOTE — ED Notes (Signed)
 LET gel applied to L eye lacerations. Gauze and tape also applied.

## 2024-02-19 NOTE — ED Triage Notes (Signed)
 Mechanical fall this morning. Lac to left eyebrow, abrasions to both knees and both elbows. Denies LOC. Denies thinners.

## 2024-02-19 NOTE — ED Provider Notes (Addendum)
.  Laceration Repair  Date/Time: 02/19/2024 1:42 PM  Performed by: Torrence Marry RAMAN, PA-C Authorized by: Torrence Marry RAMAN, PA-C   Consent:    Consent obtained:  Verbal   Consent given by:  Patient   Risks, benefits, and alternatives were discussed: yes     Risks discussed:  Infection, pain and poor cosmetic result Universal protocol:    Procedure explained and questions answered to patient or proxy's satisfaction: yes     Relevant documents present and verified: yes     Patient identity confirmed:  Verbally with patient Anesthesia:    Anesthesia method:  Local infiltration and topical application   Topical anesthetic:  LET   Local anesthetic:  Lidocaine  1% WITH epi Laceration details:    Location:  Face   Face location:  L eyebrow   Length (cm):  3   Depth (mm):  2 Exploration:    Contaminated: yes   Treatment:    Area cleansed with:  Saline   Amount of cleaning:  Standard   Irrigation solution:  Sterile saline   Irrigation volume:  10ml   Irrigation method:  Syringe   Visualized foreign bodies/material removed: no     Debridement:  Minimal   Scar revision: no   Skin repair:    Repair method:  Sutures   Suture size:  5-0   Suture material:  Prolene   Suture technique:  Simple interrupted   Number of sutures:  4 Approximation:    Approximation:  Close Repair type:    Repair type:  Simple Post-procedure details:    Dressing:  Open (no dressing)   Procedure completion:  Tolerated well, no immediate complications      Torrence Marry RAMAN, PA-C 02/19/24 1344    Levander Houston, MD 02/19/24 1458

## 2024-02-24 ENCOUNTER — Ambulatory Visit

## 2024-02-24 DIAGNOSIS — M810 Age-related osteoporosis without current pathological fracture: Secondary | ICD-10-CM | POA: Diagnosis not present

## 2024-02-24 DIAGNOSIS — E2839 Other primary ovarian failure: Secondary | ICD-10-CM | POA: Diagnosis not present

## 2024-02-25 ENCOUNTER — Ambulatory Visit: Admitting: Internal Medicine

## 2024-02-25 ENCOUNTER — Encounter: Payer: Self-pay | Admitting: Internal Medicine

## 2024-02-25 VITALS — BP 142/88 | HR 61 | Temp 98.1°F | Ht 62.5 in | Wt 114.2 lb

## 2024-02-25 DIAGNOSIS — Z4802 Encounter for removal of sutures: Secondary | ICD-10-CM | POA: Diagnosis not present

## 2024-02-25 NOTE — Progress Notes (Signed)
 Chief Complaint  Patient presents with   Hospitalization Follow-up    Pt is reports she is here to remove stitches from ER visit.     HPI: Vicki Stevens 68 y.o. come in for fu ed visit last week  running ( uneven ground fall and no other  injury  )   tissue glue and  4 suture over left eye brow .  Needs remova l   runs every day  ROS: See pertinent positives and negatives per HPI.  Past Medical History:  Diagnosis Date   COVID-19 2021   H/O hematuria remote   dr Andra felt to be from running    Heart murmur    evaluation ? mvp when younger   Hx of varicella    Hypertension    LOW BACK PAIN, ACUTE 03/05/2010   Stroke (HCC) 04/17/2020    Family History  Problem Relation Age of Onset   Other Mother        MVA 33    Stroke Father    Lung cancer Father        18    Social History   Socioeconomic History   Marital status: Married    Spouse name: Ubaldo Ronde   Number of children: Not on file   Years of education: Not on file   Highest education level: Not on file  Occupational History   Occupation: part time/self employed  Tobacco Use   Smoking status: Never   Smokeless tobacco: Never  Vaping Use   Vaping status: Never Used  Substance and Sexual Activity   Alcohol use: Yes    Comment: occasonally   Drug use: Never   Sexual activity: Not on file  Other Topics Concern   Not on file  Social History Narrative   Lives with Husband   caffeine 2-3 in am ocass    Right handed   Social Drivers of Health   Financial Resource Strain: Not on file  Food Insecurity: Not on file  Transportation Needs: Not on file  Physical Activity: Not on file  Stress: Not on file  Social Connections: Unknown (09/24/2021)   Received [facility]   Social Network    Social Network: Not on file    Outpatient Medications Prior to Visit  Medication Sig Dispense Refill   lisinopril  (ZESTRIL ) 5 MG tablet Take 1 tablet (5 mg total) by mouth daily. 90 tablet 3   fluconazole   (DIFLUCAN ) 150 MG tablet Take 1 tablet (150 mg total) by mouth daily. (Patient not taking: Reported on 02/25/2024) 1 tablet 0   magic mouthwash (nystatin , lidocaine , diphenhydrAMINE, alum & mag hydroxide) suspension Swish and spit 5 mLs 4 (four) times daily. (Patient not taking: Reported on 02/25/2024) 200 mL 0   rosuvastatin  (CRESTOR ) 10 MG tablet Take 10 mg by mouth daily. (Patient not taking: Reported on 02/25/2024)     No facility-administered medications prior to visit.     EXAM:  BP (!) 142/88 (BP Location: Left Arm, Patient Position: Sitting, Cuff Size: Normal)   Pulse 61   Temp 98.1 F (36.7 C) (Oral)   Ht 5' 2.5 (1.588 m)   Wt 114 lb 3.2 oz (51.8 kg)   SpO2 98%   BMI 20.55 kg/m   Body mass index is 20.55 kg/m.  GENERAL: vitals reviewed and listed above, alert, oriented, appears well hydrated and in no acute distress HEENT:  Glu superficial area left forehad about 1.5 cm Left eye brow area  4 small  blue sutures   removed   had scabbing  to soak off  to be bale to remove    no signs of infection  Topical antibiotic ointment   MS: moves all extremities without noticeable focal  abnormality PSYCH: pleasant and cooperative, no obvious depression or anxiety  BP Readings from Last 3 Encounters:  02/25/24 (!) 142/88  02/19/24 (!) 183/89  12/08/23 (!) 142/82   Process 20 minutes   ASSESSMENT AND PLAN:  Discussed the following assessment and plan:  Visit for suture removal Clean water based ointment and or antibiotic if needed Assume bp up from  visit and monitor  no addressed otherwise today .  Pt aware  injury  fall prevenetion -Patient advised to return or notify health care team  if  new concerns arise.  There are no Patient Instructions on file for this visit.   Zamirah Denny K. Daryle Amis M.D.

## 2024-02-25 NOTE — Progress Notes (Addendum)
 Vicki Stevens                                          MRN: [id number]   02/25/2024   The [facility] Quality Team Specialist reviewed this patient medical record for the purposes of chart review for care gap closure. The following were reviewed: chart review for care gap closure-breast cancer screening and colorectal cancer screening.  06/07/2024- NO COL TO CLOSE 2025    [facility] Quality Team

## 2024-02-26 ENCOUNTER — Inpatient Hospital Stay: Admitting: Family Medicine

## 2024-02-26 DIAGNOSIS — Z822 Family history of deafness and hearing loss: Secondary | ICD-10-CM | POA: Diagnosis not present

## 2024-02-26 DIAGNOSIS — Z1371 Encounter for nonprocreative screening for genetic disease carrier status: Secondary | ICD-10-CM | POA: Diagnosis not present

## 2024-02-26 DIAGNOSIS — Z1589 Genetic susceptibility to other disease: Secondary | ICD-10-CM | POA: Diagnosis not present

## 2024-02-26 DIAGNOSIS — H93012 Transient ischemic deafness, left ear: Secondary | ICD-10-CM | POA: Diagnosis not present

## 2024-02-26 DIAGNOSIS — Z8481 Family history of carrier of genetic disease: Secondary | ICD-10-CM | POA: Diagnosis not present

## 2024-03-08 ENCOUNTER — Ambulatory Visit: Payer: Self-pay | Admitting: Internal Medicine

## 2024-03-08 NOTE — Progress Notes (Signed)
 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 May want to make a n appt  about these results  to discuss  .

## 2024-03-17 NOTE — Progress Notes (Signed)
 Attempted to reach pt. Left a voicemail to call us back.

## 2024-07-13 ENCOUNTER — Ambulatory Visit (HOSPITAL_BASED_OUTPATIENT_CLINIC_OR_DEPARTMENT_OTHER)
